# Patient Record
Sex: Female | Born: 1950 | Race: White | Hispanic: No | Marital: Married | Smoking: Never smoker
Health system: Southern US, Community
[De-identification: ages and names within clinical notes are randomized; demographics above are authoritative.]

## PROBLEM LIST (undated history)

## (undated) DIAGNOSIS — T8859XA Other complications of anesthesia, initial encounter: Secondary | ICD-10-CM

## (undated) DIAGNOSIS — D649 Anemia, unspecified: Secondary | ICD-10-CM

## (undated) DIAGNOSIS — K317 Polyp of stomach and duodenum: Secondary | ICD-10-CM

## (undated) DIAGNOSIS — E119 Type 2 diabetes mellitus without complications: Secondary | ICD-10-CM

## (undated) DIAGNOSIS — K219 Gastro-esophageal reflux disease without esophagitis: Secondary | ICD-10-CM

## (undated) DIAGNOSIS — E785 Hyperlipidemia, unspecified: Secondary | ICD-10-CM

## (undated) DIAGNOSIS — F0390 Unspecified dementia without behavioral disturbance: Secondary | ICD-10-CM

## (undated) DIAGNOSIS — Z9289 Personal history of other medical treatment: Secondary | ICD-10-CM

## (undated) DIAGNOSIS — C8590 Non-Hodgkin lymphoma, unspecified, unspecified site: Secondary | ICD-10-CM

## (undated) DIAGNOSIS — T4145XA Adverse effect of unspecified anesthetic, initial encounter: Secondary | ICD-10-CM

## (undated) DIAGNOSIS — F329 Major depressive disorder, single episode, unspecified: Secondary | ICD-10-CM

## (undated) DIAGNOSIS — Z9481 Bone marrow transplant status: Secondary | ICD-10-CM

## (undated) DIAGNOSIS — C801 Malignant (primary) neoplasm, unspecified: Secondary | ICD-10-CM

## (undated) DIAGNOSIS — F32A Depression, unspecified: Secondary | ICD-10-CM

## (undated) HISTORY — DX: Hypomagnesemia: E83.42

## (undated) HISTORY — PX: PORTACATH PLACEMENT: SHX2246

## (undated) HISTORY — DX: Hyperlipidemia, unspecified: E78.5

## (undated) HISTORY — PX: BONE MARROW TRANSPLANT: SHX200

## (undated) HISTORY — DX: Bone marrow transplant status: Z94.81

## (undated) HISTORY — PX: LYMPH NODE DISSECTION: SHX5087

## (undated) HISTORY — DX: Anemia, unspecified: D64.9

## (undated) HISTORY — PX: REDUCTION MAMMAPLASTY: SUR839

---

## 2011-02-08 HISTORY — PX: UPPER GASTROINTESTINAL ENDOSCOPY: SHX188

## 2011-08-19 ENCOUNTER — Other Ambulatory Visit: Payer: Self-pay | Admitting: *Deleted

## 2011-08-19 ENCOUNTER — Ambulatory Visit: Payer: BC Managed Care – PPO

## 2011-08-19 ENCOUNTER — Ambulatory Visit
Admission: RE | Admit: 2011-08-19 | Discharge: 2011-08-19 | Disposition: A | Discharge status: Home / Self Care | Payer: BC Managed Care – PPO | Source: Ambulatory Visit | Attending: *Deleted | Admitting: *Deleted

## 2011-08-19 DIAGNOSIS — M25559 Pain in unspecified hip: Secondary | ICD-10-CM

## 2011-08-19 DIAGNOSIS — M7989 Other specified soft tissue disorders: Secondary | ICD-10-CM

## 2011-08-19 DIAGNOSIS — M79609 Pain in unspecified limb: Secondary | ICD-10-CM

## 2011-08-21 ENCOUNTER — Other Ambulatory Visit: Payer: Self-pay

## 2011-08-21 ENCOUNTER — Emergency Department (HOSPITAL_COMMUNITY): Payer: BC Managed Care – PPO

## 2011-08-21 ENCOUNTER — Inpatient Hospital Stay (HOSPITAL_COMMUNITY)
Admission: EM | Admit: 2011-08-21 | Discharge: 2011-08-22 | DRG: 404 | Disposition: A | Discharge status: Home / Self Care | Payer: BC Managed Care – PPO | Attending: Internal Medicine | Admitting: Internal Medicine

## 2011-08-21 ENCOUNTER — Encounter: Payer: Self-pay | Admitting: Emergency Medicine

## 2011-08-21 DIAGNOSIS — E119 Type 2 diabetes mellitus without complications: Secondary | ICD-10-CM | POA: Diagnosis present

## 2011-08-21 DIAGNOSIS — I10 Essential (primary) hypertension: Secondary | ICD-10-CM | POA: Diagnosis present

## 2011-08-21 DIAGNOSIS — R1013 Epigastric pain: Secondary | ICD-10-CM | POA: Diagnosis present

## 2011-08-21 DIAGNOSIS — D131 Benign neoplasm of stomach: Secondary | ICD-10-CM | POA: Diagnosis present

## 2011-08-21 DIAGNOSIS — C8590 Non-Hodgkin lymphoma, unspecified, unspecified site: Secondary | ICD-10-CM

## 2011-08-21 DIAGNOSIS — R109 Unspecified abdominal pain: Secondary | ICD-10-CM

## 2011-08-21 DIAGNOSIS — K219 Gastro-esophageal reflux disease without esophagitis: Secondary | ICD-10-CM | POA: Diagnosis present

## 2011-08-21 DIAGNOSIS — C8589 Other specified types of non-Hodgkin lymphoma, extranodal and solid organ sites: Principal | ICD-10-CM | POA: Diagnosis present

## 2011-08-21 HISTORY — DX: Malignant (primary) neoplasm, unspecified: C80.1

## 2011-08-21 HISTORY — DX: Type 2 diabetes mellitus without complications: E11.9

## 2011-08-21 HISTORY — DX: Polyp of stomach and duodenum: K31.7

## 2011-08-21 HISTORY — DX: Gastro-esophageal reflux disease without esophagitis: K21.9

## 2011-08-21 LAB — CARDIAC PANEL(CRET KIN+CKTOT+MB+TROPI)
CK, MB: 2.3 ng/mL (ref 0.3–4.0)
CK, MB: 2.4 ng/mL (ref 0.3–4.0)
Relative Index: INVALID (ref 0.0–2.5)
Total CK: 33 U/L (ref 7–177)
Total CK: 31 U/L (ref 7–177)
Total CK: 25 U/L (ref 7–177)
Troponin I: <0.3 ng/mL (ref <0.30)

## 2011-08-21 LAB — DIFFERENTIAL
Eosinophils Relative: 1 % (ref 0–5)
Lymphocytes Relative: 16 % (ref 12–46)
Monocytes Absolute: 0.3 10*3/uL (ref 0.1–1.0)
Monocytes Relative: 6 % (ref 3–12)
Neutro Abs: 4.4 10*3/uL (ref 1.7–7.7)

## 2011-08-21 LAB — COMPREHENSIVE METABOLIC PANEL
BUN: 9 mg/dL (ref 6–23)
CO2: 27 mEq/L (ref 19–32)
Calcium: 9.8 mg/dL (ref 8.4–10.5)
Chloride: 100 mEq/L (ref 96–112)
Creatinine, Ser: 0.72 mg/dL (ref 0.50–1.10)
GFR calc Af Amer: >90 mL/min (ref >90)
GFR calc non Af Amer: >90 mL/min (ref >90)
Glucose, Bld: 151 mg/dL — ABNORMAL HIGH (ref 70–99)
Total Bilirubin: 0.8 mg/dL (ref 0.3–1.2)

## 2011-08-21 LAB — URINALYSIS, ROUTINE W REFLEX MICROSCOPIC
Glucose, UA: NEGATIVE mg/dL
Ketones, ur: 15 mg/dL — AB
Leukocytes, UA: NEGATIVE
Nitrite: NEGATIVE
Protein, ur: NEGATIVE mg/dL
Urobilinogen, UA: 0.2 mg/dL (ref 0.0–1.0)

## 2011-08-21 LAB — CBC
HCT: 35.3 % — ABNORMAL LOW (ref 36.0–46.0)
Hemoglobin: 12.3 g/dL (ref 12.0–15.0)
MCV: 85.5 fL (ref 78.0–100.0)
WBC: 5.7 10*3/uL (ref 4.0–10.5)

## 2011-08-21 LAB — LIPASE, BLOOD: Lipase: 35 U/L (ref 11–59)

## 2011-08-21 MED ORDER — ACETAMINOPHEN 325 MG PO TABS
650.0000 mg | ORAL_TABLET | Freq: Four times a day (QID) | ORAL | Status: DC | PRN
  Administered 2011-08-22 (×2): 650 mg via ORAL
  Filled 2011-08-21 (×2): qty 2

## 2011-08-21 MED ORDER — HYDROMORPHONE HCL PF 1 MG/ML IJ SOLN
1.0000 mg | Freq: Once | INTRAMUSCULAR | Status: AC
  Administered 2011-08-21: 1 mg via INTRAVENOUS
  Filled 2011-08-21: qty 1

## 2011-08-21 MED ORDER — ACETAMINOPHEN 650 MG RE SUPP: 650.0000 mg | Freq: Four times a day (QID) | RECTAL | Status: DC | PRN

## 2011-08-21 MED ORDER — ONDANSETRON HCL 4 MG/2ML IJ SOLN
4.0000 mg | Freq: Once | INTRAMUSCULAR | Status: AC
  Administered 2011-08-21: 4 mg via INTRAVENOUS
  Filled 2011-08-21: qty 2

## 2011-08-21 MED ORDER — SODIUM CHLORIDE 0.9 % IV BOLUS (SEPSIS)
500.0000 mL | Freq: Once | INTRAVENOUS | Status: AC
  Administered 2011-08-21: 500 mL via INTRAVENOUS

## 2011-08-21 MED ORDER — OXYCODONE HCL 5 MG PO TABS: 5.0000 mg | ORAL_TABLET | ORAL | Status: DC | PRN

## 2011-08-21 MED ORDER — MORPHINE SULFATE 2 MG/ML IJ SOLN: 1.0000 mg | INTRAMUSCULAR | Status: DC | PRN

## 2011-08-21 MED ORDER — ENOXAPARIN SODIUM 30 MG/0.3ML ~~LOC~~ SOLN
30.0000 mg | SUBCUTANEOUS | Status: DC
  Administered 2011-08-21: 30 mg via SUBCUTANEOUS
  Filled 2011-08-21 (×2): qty 0.3

## 2011-08-21 MED ORDER — SODIUM CHLORIDE 0.9 % IV SOLN
INTRAVENOUS | Status: DC
  Administered 2011-08-21 – 2011-08-22 (×2): via INTRAVENOUS

## 2011-08-21 MED ORDER — MORPHINE SULFATE 2 MG/ML IJ SOLN
2.0000 mg | INTRAMUSCULAR | Status: DC | PRN
  Administered 2011-08-21: 2 mg via INTRAVENOUS
  Filled 2011-08-21: qty 1

## 2011-08-21 MED ORDER — RAMIPRIL 10 MG PO CAPS
20.0000 mg | ORAL_CAPSULE | Freq: Every day | ORAL | Status: DC
  Administered 2011-08-21: 20 mg via ORAL
  Filled 2011-08-21 (×4): qty 2

## 2011-08-21 MED ORDER — INSULIN ASPART 100 UNIT/ML ~~LOC~~ SOLN
0.0000 [IU] | Freq: Three times a day (TID) | SUBCUTANEOUS | Status: DC
  Filled 2011-08-21: qty 3

## 2011-08-21 MED ORDER — ONDANSETRON HCL 4 MG/2ML IJ SOLN
INTRAMUSCULAR | Status: AC
  Administered 2011-08-21: 4 mg via INTRAVENOUS
  Filled 2011-08-21: qty 2

## 2011-08-21 MED ORDER — ONDANSETRON HCL 4 MG/2ML IJ SOLN
4.0000 mg | Freq: Once | INTRAMUSCULAR | Status: AC
  Administered 2011-08-21: 4 mg via INTRAVENOUS

## 2011-08-21 MED ORDER — SODIUM CHLORIDE 0.9 % IV SOLN
INTRAVENOUS | Status: DC
  Administered 2011-08-21: 08:00:00 via INTRAVENOUS

## 2011-08-21 MED ORDER — PANTOPRAZOLE SODIUM 40 MG IV SOLR
40.0000 mg | Freq: Two times a day (BID) | INTRAVENOUS | Status: DC
  Administered 2011-08-21 – 2011-08-22 (×2): 40 mg via INTRAVENOUS
  Filled 2011-08-21 (×7): qty 40

## 2011-08-21 MED ORDER — IOHEXOL 300 MG/ML  SOLN
100.0000 mL | Freq: Once | INTRAMUSCULAR | Status: AC | PRN
  Administered 2011-08-21: 100 mL via INTRAVENOUS

## 2011-08-21 MED ORDER — VENLAFAXINE HCL ER 75 MG PO CP24
75.0000 mg | ORAL_CAPSULE | Freq: Every day | ORAL | Status: DC
  Administered 2011-08-21: 75 mg via ORAL
  Filled 2011-08-21 (×4): qty 1

## 2011-08-21 MED ORDER — ALPRAZOLAM 0.25 MG PO TABS: 0.2500 mg | ORAL_TABLET | Freq: Two times a day (BID) | ORAL | Status: DC | PRN

## 2011-08-21 NOTE — ED Notes (Signed)
Vital signs stable. 

## 2011-08-21 NOTE — Consult Note (Signed)
Chart was reviewed and patient was examined. X-rays were reviewed.    I agree with management and plans.  I suspect pain is related to worsening lymphoma/ adenopathy.  Active PUD is less likely althoughnought to be r/o.  Barbette Hair. Arlyce Dice, M.D., [facility]

## 2011-08-21 NOTE — ED Notes (Signed)
MD at bedside. 

## 2011-08-21 NOTE — ED Notes (Signed)
ZOX:[id number]<AV> Expected date:08/21/11<BR> Expected time: 6:53 AM<BR> Means of arrival:Ambulance<BR> Comments:<BR> Abdominal pain

## 2011-08-21 NOTE — ED Notes (Signed)
Family at bedside. 

## 2011-08-21 NOTE — H&P (Signed)
Patient's PCP: No primary provider on file., patient reports that she is from [place] visiting her daughter.  Chief Complaint: Epigastric pain  History of Present Illness: Victoria Wood is a 60 y.o. Caucasian female with history of non-Hodgkin's lymphoma who has recently received Rituxan, GERD, hypertension, Type 2 DM, who presents with the above complaints.  She reports that she just received Dexon in November of 2012 for non-Hodgkin's lymphoma.  She also reports that about 3 or 4 months ago she was admitted to our hospital in [place] and has had extensive cardiac workup.  She reports that she has had a stress test as well as a 2-D echocardiogram which I do not have the results available for review.  She also reports that she has had a GI workup done but could not tell me exactly what workup she has had.  She does admit to having GERD and takes Nexium intermittently.  This morning around 3-4 o'clock she reported sharp burning-like pain in the epigastric region with radiating pain to her signs in a bandlike manner.  Because her pain was getting worse she presented to the emergency department for further evaluation.  CT scan was obtained with results as indicated above.  The hospitalist service was asked to admit the patient for further care and management.  Patient denies any recent fevers, chills, nausea or vomiting. Denies any shortness of breath.  Meds: Scheduled Meds:   .  HYDROmorphone (DILAUDID) injection  1 mg Intravenous Once  . ondansetron  4 mg Intravenous Once  . sodium chloride  500 mL Intravenous Once   Continuous Infusions:   . sodium chloride 125 mL/hr at 08/21/11 0751   PRN Meds:.iohexol Allergies: Penicillins and Shellfish allergy Past Medical History  Diagnosis Date  . Gastric polyps     History of  . Cancer     hx of Rituxan in 07/2011  . Non-Hodgkin's lymphoma   . GERD (gastroesophageal reflux disease)   . HTN (hypertension)   . DM type 2 (diabetes mellitus, type 2)      Past Surgical History  Procedure Date  . Portacath placement   . Upper gastrointestinal endoscopy 02/2011    gastric polyps x 2  . Neck and thigh surgery     lymph nodes removed   Family History  Problem Relation Age of Onset  . Hypertension Mother   . Hypertension Father   . Kidney disease Father    History   Social History  . Marital Status: Married    Spouse Name: N/A    Number of Children: N/A  . Years of Education: N/A   Occupational History  . Not on file.   Social History Main Topics  . Smoking status: Never Smoker   . Smokeless tobacco: Never Used  . Alcohol Use: 2.4 oz/week    4 Glasses of wine per week  . Drug Use:   . Sexually Active: No   Other Topics Concern  . Not on file   Social History Narrative  . No narrative on file   Review of Systems: All systems reviewed with the patient and positive as per history of present illness, otherwise all other systems are negative.  Physical Exam: Blood pressure 132/59, pulse 68, temperature 98.4 F (36.9 C), temperature source Oral, resp. rate 18, SpO2 100.00%. General: Awake, Oriented x3, No acute distress. HEENT: EOMI, Moist mucous membranes Neck: Supple CV: S1 and S2 Lungs: Clear to ascultation bilaterally Abdomen: Soft, minimal tenderness to palpation, reports pain is constant not  unchanged by palpation, Nondistended, +bowel sounds. Ext: Good pulses. Trace edema. No clubbing or cyanosis noted. Neuro: Cranial Nerves II-XII grossly intact. Has 5/5 motor strength in upper and lower extremities.  Lab results:  [facility] 08/21/11 0744  NA 137  K 3.5  CL 100  CO2 27  GLUCOSE 151*  BUN 9  CREATININE 0.72  CALCIUM 9.8  MG --  PHOS --    Basename 08/21/11 0744  AST 17  ALT 9  ALKPHOS 73  BILITOT 0.8  PROT 6.4  ALBUMIN 3.9    Basename 08/21/11 0744  LIPASE 35  AMYLASE --    Basename 08/21/11 0744  WBC 5.7  NEUTROABS 4.4  HGB 12.3  HCT 35.3*  MCV 85.5  PLT 155    Basename  08/21/11 0744  CKTOTAL 33  CKMB 2.3  CKMBINDEX --  TROPONINI <0.30   No components found with this basename: POCBNP:3 No results found for this basename: DDIMER in the last 72 hours No results found for this basename: HGBA1C:2 in the last 72 hours No results found for this basename: CHOL:2,HDL:2,LDLCALC:2,TRIG:2,CHOLHDL:2,LDLDIRECT:2 in the last 72 hours No results found for this basename: TSH,T4TOTAL,FREET3,T3FREE,THYROIDAB in the last 72 hours No results found for this basename: VITAMINB12:2,FOLATE:2,FERRITIN:2,TIBC:2,IRON:2,RETICCTPCT:2 in the last 72 hours Imaging results:  Ct Abdomen Pelvis W Contrast  08/21/2011  *RADIOLOGY REPORT*  Clinical Data: Epigastric pain, nausea.  History of gastric lymphoma with prior chemotherapy.  CT ABDOMEN AND PELVIS WITH CONTRAST  Technique:  Multidetector CT imaging of the abdomen and pelvis was performed following the standard protocol during bolus administration of intravenous contrast.  Contrast: OMNIPAQUE IOHEXOL 300 MG/ML IV SOLN none.  Comparison: None.  Findings: Bibasilar atelectasis.  Heart is upper limits normal in size.  No effusions.  There are mildly enlarged right cardiophrenic lymph nodes.  Lymph node on image 7 measures 13 mm.  Lymph node on image 19 anterior to the liver measures 12 mm.  On the same image, a lymph node anterior to the right heart measures 9 mm.  These are short axis measurements.  Borderline and mildly enlarged celiac axis and porta hepatis lymph nodes.  Portacaval lymph node on image 25 has a short axis diameter of 19 mm.  There is extensive retroperitoneal adenopathy.  Left periaortic lymph node on image 38 has a short axis diameter of 23 mm.  Aortocaval lymph node on the same image has a short axis diameter of 20 mm.  Adenopathy continues into the iliac chains bilaterally, right much greater than left.  Enlarged lymph node at the common iliac bifurcation on the right has a short axis diameter of 26 mm.  External iliac  node on image 73 on the right has a short axis diameter of 24 mm.  Left external iliac node on image 73 has a short axis diameter of 12 mm.  Bilateral inguinal adenopathy also noted, right greater than left.  Small amount of free fluid in the pelvis.  Uterus, adnexa urinary bladder are grossly unremarkable.  Liver, spleen, pancreas, adrenals and kidneys are normal.  Stomach is grossly unremarkable. There is mild haziness throughout the mesentery with borderline and mildly enlarged mesenteric lymph nodes.  Large and small bowel are grossly unremarkable.  Aorta is calcified, non-aneurysmal.  IMPRESSION: Extensive of abdominal adenopathy including retroperitoneal, mesenteric, porta hepatis and celiac axis.  This continues in the pelvis within the iliac chains and pelvic sidewall, right greater than left.  Also, inguinal adenopathy, right greater than left. There is a right cardiophrenic  angle adenopathy as well. Findings findings most compatible with lymphoma.  Small amount of free fluid in the pelvis.  Dependent atelectasis in the lung bases.  Original Report Authenticated By: Cyndie Chime, M.D.   US Venous Img Lower Bilateral  08/19/2011  *RADIOLOGY REPORT*  Clinical Data: Right lower extremity edema.  VENOUS DUPLEX ULTRASOUND OF BILATERAL LOWER EXTREMITIES  Technique:  Gray-scale sonography with graded compression, as well as color Doppler and duplex ultrasound, were performed to evaluate the deep venous system of both lower extremities from the level of the common femoral vein through the popliteal and proximal calf veins.  Spectral Doppler was utilized to evaluate flow at rest and with distal augmentation maneuvers.  Comparison:  None.  Findings: From the level of the common femoral vein to the popliteal vein, there is adequate color flow, compression and augmentation without evidence of a lower extremity deep venous thrombosis on either side.  Superficial veins are compressible as are visualized calf veins.   Reflux is noted bilaterally.  Bilateral inguinal adenopathy.  IMPRESSION: No evidence of lower extremity deep venous thrombosis on either side.  Bilateral inguinal adenopathy.  Etiology/significance indeterminate.  Original Report Authenticated By: Fuller Canada, M.D.   Other results: EKG: normal EKG, normal sinus rhythm, there are no previous tracings available for comparison.  Assessment & Plan by Problem: 1. Epigastric pain - Etiology unclear at this time.  Lipase and liver function tests normal.  CT scan shows the above findings.  Spoke with gastroenterology who will evaluate the patient for further workup.  Will also try to obtain results from recent GI workup done in [place].  Patient reports that she has had extensive cardiac workup including a stress test a few months ago, results not available for review, however will admit the patient to telemetry and trend troponins to rule the patient out for acute coronary syndrome.  We'll have the patient on twice a day IV PPI.  2.  Non-Hodgkin's lymphoma - CT scan shows the above findings.  I do not have prior imaging for comparison.  Old try to obtain records from her oncologist.  Continue management as per outpatient.  Per radiology the largest lymph node is 2.4 cm.  3. GERD (gastroesophageal reflux disease) - Continue twice a day IV PPI.  4. HTN (hypertension) - stable at this time continue home medications.  5. DM type 2 (diabetes mellitus, type 2) - hold metformin as the patient received contrast for the CT.  Will have the patient on sensitive sliding scale insulin.  6. Prophylaxis.  Lovenox for DVT prophylaxis and PPI for GI prophylaxis.  7.  CODE STATUS, full code this was discussed with patient and daughter at the time of admission.  Time spent on admission, talking to the patient, and coordinating care was: 60 mins.  Ta Fair A, MD 08/21/2011, 2:23 PM

## 2011-08-21 NOTE — Consult Note (Signed)
Referring Provider: [facility]-dr ReddyPrimary Care Physician:  No primary provider on file. Primary Gastroenterologist:  None locally, has Md in [place]  Reason for Consultation:  Epigastric pain acute  HPI: Victoria Wood is a 60 y.o. female with hx of non Hodgkins lymphoma initially dx at age 17. She had recurrence in 2008 and underwent chemo with good response. She had recurrence again found this fall. She has stage 4 disease. She has just completed a regimen of 4 courses of Rituxan, given weekly over the past month. She says she had re-imaging and knows the Rituxan did not work. Her plan was to come here for the holidays, then decide on further treatment when she got back home.  She says her appetite has been poor, and she has been losing weight. No N/v. No fever/chills, no diarrhea, melena  etc . She has had some abdominal pain all along , but says it has not been bad. She has no tbeen requiring any pain meds, or Asa, Nsaids.  This am she  Developed much worse band like pain across her upper abdomen, burning in nature, no radiation, and came to the  ER.   Labs here unremarkable, Ct as outlined below,.   Past Medical History  Diagnosis Date  . Gastric polyps     History of  . Cancer     hx of Rituxan in 07/2011  . Non-Hodgkin's lymphoma   . GERD (gastroesophageal reflux disease)   . HTN (hypertension)   . DM type 2 (diabetes mellitus, type 2)     Past Surgical History  Procedure Date  . Portacath placement   . Upper gastrointestinal endoscopy 02/2011    gastric polyps x 2  . Neck and thigh surgery     lymph nodes removed    Prior to Admission medications   Medication Sig Start Date End Date Taking? Authorizing Provider  ALPRAZolam (XANAX) 0.25 MG tablet Take 0.25 mg by mouth 2 (two) times daily as needed.     Yes Historical Provider, MD  Cyanocobalamin (VITAMIN B 12 PO) Take 1 tablet by mouth daily.     Yes Historical Provider, MD  metFORMIN (GLUCOPHAGE) 500 MG tablet  Take 500 mg by mouth 2 (two) times daily with a meal.     Yes Historical Provider, MD  OVER THE COUNTER MEDICATION Take 3 tablets by mouth daily. Procera vitamin    Yes Historical Provider, MD  ramipril (ALTACE) 10 MG tablet Take 20 mg by mouth daily.     Yes Historical Provider, MD  venlafaxine (EFFEXOR-XR) 75 MG 24 hr capsule Take 75 mg by mouth daily.     Yes Historical Provider, MD    Current Facility-Administered Medications  Medication Dose Route Frequency Provider Last Rate Last Dose  . 0.9 %  sodium chloride infusion   Intravenous Continuous Carleene Cooper III, MD 125 mL/hr at 08/21/11 0751    . HYDROmorphone (DILAUDID) injection 1 mg  1 mg Intravenous Once Carleene Cooper III, MD   1 mg at 08/21/11 0746  . iohexol (OMNIPAQUE) 300 MG/ML solution 100 mL  100 mL Intravenous Once PRN Medication Radiologist   100 mL at 08/21/11 0956  . ondansetron (ZOFRAN) injection 4 mg  4 mg Intravenous Once Carleene Cooper III, MD   4 mg at 08/21/11 0745  . pantoprazole (PROTONIX) injection 40 mg  40 mg Intravenous Q12H Srikar A Reddy      . sodium chloride 0.9 % bolus 500 mL  500 mL Intravenous Once Carleene Cooper  III, MD   500 mL at 08/21/11 5409   Current Outpatient Prescriptions  Medication Sig Dispense Refill  . ALPRAZolam (XANAX) 0.25 MG tablet Take 0.25 mg by mouth 2 (two) times daily as needed.        . Cyanocobalamin (VITAMIN B 12 PO) Take 1 tablet by mouth daily.        . metFORMIN (GLUCOPHAGE) 500 MG tablet Take 500 mg by mouth 2 (two) times daily with a meal.        . OVER THE COUNTER MEDICATION Take 3 tablets by mouth daily. Procera vitamin       . ramipril (ALTACE) 10 MG tablet Take 20 mg by mouth daily.        Marland Kitchen venlafaxine (EFFEXOR-XR) 75 MG 24 hr capsule Take 75 mg by mouth daily.          Allergies as of 08/21/2011 - Review Complete 08/21/2011  Allergen Reaction Noted  . Penicillins  08/21/2011  . Shellfish allergy  08/21/2011    Family History  Problem Relation Age of Onset  .  Hypertension Mother   . Hypertension Father   . Kidney disease Father     History   Social History  . Marital Status: Married    Spouse Name: N/A    Number of Children: N/A  . Years of Education: N/A   Occupational History  . Not on file.   Social History Main Topics  . Smoking status: Never Smoker   . Smokeless tobacco: Never Used  . Alcohol Use: 2.4 oz/week    4 Glasses of wine per week  . Drug Use:   . Sexually Active: No   Other Topics Concern  . Not on file   Social History Narrative  . No narrative on file    Review of Systems: Pertinent positive and negative review of systems were noted in the above HPI section.  All other review of systems was otherwise negative.  Physical Exam: Vital signs in last 24 hours: Temp:  [97.5 F (36.4 C)-98.4 F (36.9 C)] 98.4 F (36.9 C) (12/12 1223) Pulse Rate:  [66-79] 68  (12/12 1402) Resp:  [15-24] 18  (12/12 1402) BP: (132-169)/(59-78) 132/59 mmHg (12/12 1402) SpO2:  [100 %] 100 % (12/12 1402) Last BM Date:  (- stated sometime on 08/20/2011) General:   Alert,  Well-developed, well-nourished, pleasant and cooperative in NAD,uncomfortable appearing Head:  Normocephalic and atraumatic. Eyes:  Sclera clear, no icterus.   Conjunctiva pink. Ears:  Normal auditory acuity. Nose:  No deformity, discharge,  or lesions. Mouth:  No deformity or lesions.    Lungs:  Clear throughout to auscultation.   No wheezes, crackles, or rhonchi. Heart:  Regular rate and rhythm; no murmurs, clicks, rubs,  or gallops. Abdomen: soft, tender rather diffusely, more so in upper abdomen, Bs+ no palp mass or HSM Rectal; not done Msk:  Symmetrical without gross deformities. . Pulses:  Normal pulses noted. Extremities:  Without clubbing or edema. Neurologic:  Alert and  oriented x4;  grossly normal neurologically. Skin:  Intact without significant lesions or rashes.. Psych:  Alert and cooperative. Normal mood and affect.  Intake/Output from  previous day:   Intake/Output this shift:    Lab Results:  [facility] 08/21/11 0744  WBC 5.7  HGB 12.3  HCT 35.3*  PLT 155   BMET  Basename 08/21/11 0744  NA 137  K 3.5  CL 100  CO2 27  GLUCOSE 151*  BUN 9  CREATININE 0.72  CALCIUM  9.8   LFT  Basename 08/21/11 0744  PROT 6.4  ALBUMIN 3.9  AST 17  ALT 9  ALKPHOS 73  BILITOT 0.8  BILIDIR --  IBILI --   PT/INR No results found for this basename: LABPROT:2,INR:2 in the last 72 hours Hepatitis Panel No results found for this basename: HEPBSAG,HCVAB,HEPAIGM,HEPBIGM in the last 72 hours    Studies/Results: Ct Abdomen Pelvis W Contrast  08/21/2011  *RADIOLOGY REPORT*  Clinical Data: Epigastric pain, nausea.  History of gastric lymphoma with prior chemotherapy.  CT ABDOMEN AND PELVIS WITH CONTRAST  Technique:  Multidetector CT imaging of the abdomen and pelvis was performed following the standard protocol during bolus administration of intravenous contrast.  Contrast: OMNIPAQUE IOHEXOL 300 MG/ML IV SOLN none.  Comparison: None.  Findings: Bibasilar atelectasis.  Heart is upper limits normal in size.  No effusions.  There are mildly enlarged right cardiophrenic lymph nodes.  Lymph node on image 7 measures 13 mm.  Lymph node on image 19 anterior to the liver measures 12 mm.  On the same image, a lymph node anterior to the right heart measures 9 mm.  These are short axis measurements.  Borderline and mildly enlarged celiac axis and porta hepatis lymph nodes.  Portacaval lymph node on image 25 has a short axis diameter of 19 mm.  There is extensive retroperitoneal adenopathy.  Left periaortic lymph node on image 38 has a short axis diameter of 23 mm.  Aortocaval lymph node on the same image has a short axis diameter of 20 mm.  Adenopathy continues into the iliac chains bilaterally, right much greater than left.  Enlarged lymph node at the common iliac bifurcation on the right has a short axis diameter of 26 mm.  External iliac  node on image 73 on the right has a short axis diameter of 24 mm.  Left external iliac node on image 73 has a short axis diameter of 12 mm.  Bilateral inguinal adenopathy also noted, right greater than left.  Small amount of free fluid in the pelvis.  Uterus, adnexa urinary bladder are grossly unremarkable.  Liver, spleen, pancreas, adrenals and kidneys are normal.  Stomach is grossly unremarkable. There is mild haziness throughout the mesentery with borderline and mildly enlarged mesenteric lymph nodes.  Large and small bowel are grossly unremarkable.  Aorta is calcified, non-aneurysmal.  IMPRESSION: Extensive of abdominal adenopathy including retroperitoneal, mesenteric, porta hepatis and celiac axis.  This continues in the pelvis within the iliac chains and pelvic sidewall, right greater than left.  Also, inguinal adenopathy, right greater than left. There is a right cardiophrenic angle adenopathy as well. Findings findings most compatible with lymphoma.  Small amount of free fluid in the pelvis.  Dependent atelectasis in the lung bases.  Original Report Authenticated By: Cyndie Chime, M.D.   US Venous Img Lower Bilateral  08/19/2011  *RADIOLOGY REPORT*  Clinical Data: Right lower extremity edema.  VENOUS DUPLEX ULTRASOUND OF BILATERAL LOWER EXTREMITIES  Technique:  Gray-scale sonography with graded compression, as well as color Doppler and duplex ultrasound, were performed to evaluate the deep venous system of both lower extremities from the level of the common femoral vein through the popliteal and proximal calf veins.  Spectral Doppler was utilized to evaluate flow at rest and with distal augmentation maneuvers.  Comparison:  None.  Findings: From the level of the common femoral vein to the popliteal vein, there is adequate color flow, compression and augmentation without evidence of a lower extremity deep venous  thrombosis on either side.  Superficial veins are compressible as are visualized calf veins.   Reflux is noted bilaterally.  Bilateral inguinal adenopathy.  IMPRESSION: No evidence of lower extremity deep venous thrombosis on either side.  Bilateral inguinal adenopathy.  Etiology/significance indeterminate.  Original Report Authenticated By: Fuller Canada, M.D.    Impression: #1 60 yo female with known Stage 4  Non Hodgkins lymphoma, just completed four rounds of rituxan with no response. Now presenting with acute  Upper abdominal pain  Onset today.  R/o pain secondary to lymphoma, r/o gastropathy, PUD etc.  Plan:  Will schedule for EGD in am Clear liquids tonight Change pain med to morphine as dilaudid is causing severe headache  PPi twice daily  pt is not opposed to Oncology consult here, and treatment if needed. She and her husband have been planning on relocating here.   Violetta Lavalle  08/21/2011, 4:02 PM

## 2011-08-21 NOTE — ED Provider Notes (Addendum)
History     CSN: [id number] Arrival date & time: 08/21/2011  7:11 AM   First MD Initiated Contact with Patient 08/21/11 [phone]      Chief Complaint  Patient presents with  . Abdominal Pain    (Consider location/radiation/quality/duration/timing/severity/associated sxs/prior treatment) Patient is a 60 y.o. female presenting with abdominal pain. The history is provided by the patient and a relative.  Abdominal Pain The primary symptoms of the illness include abdominal pain and nausea. The primary symptoms of the illness do not include vomiting. The current episode started 3 to 5 hours ago. The onset of the illness was sudden. The problem has been gradually worsening.  The abdominal pain is located in the epigastric region. The abdominal pain radiates to the back. The severity of the abdominal pain is 10/10. The abdominal pain is relieved by nothing. The abdominal pain is exacerbated by vomiting.  Risk factors: She has a history of gastric lymphoma, and finished a course of 5 Rituxan (sp?) treatments in November. Additional symptoms associated with the illness include back pain. Symptoms associated with the illness do not include constipation.    Past Medical History  Diagnosis Date  . Cancer     Past Surgical History  Procedure Date  . Portacath placement     No family history on file.  History  Substance Use Topics  . Smoking status: Not on file  . Smokeless tobacco: Not on file  . Alcohol Use: No    OB History    Grav Para Term Preterm Abortions TAB SAB Ect Mult Living                  Review of Systems  Constitutional: Negative.   HENT: Negative.   Eyes: Negative.   Respiratory: Negative.   Cardiovascular: Negative.   Gastrointestinal: Positive for nausea and abdominal pain. Negative for vomiting, constipation and blood in stool.  Genitourinary: Negative.   Musculoskeletal: Positive for back pain.       Pt had swelling of the left leg 2 days ago, was seen at  [facility], had negative venous dopplers.    Neurological: Negative.   Psychiatric/Behavioral: Negative.     Allergies  Review of patient's allergies indicates not on file.  Home Medications  No current outpatient prescriptions on file.  BP 169/78  Pulse 71  Temp(Src) 97.5 F (36.4 C) (Oral)  Resp 24  SpO2 100%  Physical Exam  Vitals reviewed. Constitutional: She is oriented to person, place, and time. She appears well-developed and well-nourished.       In moderate to severe distress with epigastric pain.    HENT:       Mouth dry.   Eyes: Conjunctivae and EOM are normal. Pupils are equal, round, and reactive to light. No scleral icterus.  Neck: Normal range of motion. Neck supple.  Cardiovascular: Normal rate, regular rhythm and normal heart sounds.   Pulmonary/Chest: Effort normal and breath sounds normal. No respiratory distress. She exhibits no tenderness.  Abdominal: Soft.       She has epigastric tenderness, no mass, rebound or rigidity.  Musculoskeletal: Normal range of motion.  Lymphadenopathy:    She has no cervical adenopathy.  Neurological: She is alert and oriented to person, place, and time.       No sensory or motor deficit.   Skin: Skin is warm and dry.  Psychiatric: She has a normal mood and affect. Her behavior is normal.    ED Course  Procedures (including critical care  time)   Labs Reviewed  CBC  DIFFERENTIAL  COMPREHENSIVE METABOLIC PANEL  LIPASE, BLOOD  URINALYSIS, ROUTINE W REFLEX MICROSCOPIC  URINE CULTURE  CARDIAC PANEL(CRET KIN+CKTOT+MB+TROPI)   US Venous Img Lower Bilateral  08/19/2011  *RADIOLOGY REPORT*  Clinical Data: Right lower extremity edema.  VENOUS DUPLEX ULTRASOUND OF BILATERAL LOWER EXTREMITIES  Technique:  Gray-scale sonography with graded compression, as well as color Doppler and duplex ultrasound, were performed to evaluate the deep venous system of both lower extremities from the level of the common femoral vein  through the popliteal and proximal calf veins.  Spectral Doppler was utilized to evaluate flow at rest and with distal augmentation maneuvers.  Comparison:  None.  Findings: From the level of the common femoral vein to the popliteal vein, there is adequate color flow, compression and augmentation without evidence of a lower extremity deep venous thrombosis on either side.  Superficial veins are compressible as are visualized calf veins.  Reflux is noted bilaterally.  Bilateral inguinal adenopathy.  IMPRESSION: No evidence of lower extremity deep venous thrombosis on either side.  Bilateral inguinal adenopathy.  Etiology/significance indeterminate.  Original Report Authenticated By: Fuller Canada, M.D.   7:46 AM Pt seen --> physical exam performed.  IV fluids ordered.  IV Dilaudid and Zofran ordered.  Lab work and CT of abdomen/pelvis with oral and IV contrast ordered.  8:07 AM  Date: 08/21/2011  Rate: 63  Rhythm: normal sinus rhythm  QRS Axis: normal  Intervals: normal  ST/T Wave abnormalities: normal  Conduction Disutrbances:none  Narrative Interpretation: Normal EKG  Old EKG Reviewed: none available  10:50 AM Results for orders placed during the hospital encounter of 08/21/11  CBC      Component Value Range   WBC 5.7  4.0 - 10.5 (K/uL)   RBC 4.13  3.87 - 5.11 (MIL/uL)   Hemoglobin 12.3  12.0 - 15.0 (g/dL)   HCT 16.1 (*) 09.6 - 46.0 (%)   MCV 85.5  78.0 - 100.0 (fL)   MCH 29.8  26.0 - 34.0 (pg)   MCHC 34.8  30.0 - 36.0 (g/dL)   RDW 04.5  40.9 - 81.1 (%)   Platelets 155  150 - 400 (K/uL)  DIFFERENTIAL      Component Value Range   Neutrophils Relative 77  43 - 77 (%)   Neutro Abs 4.4  1.7 - 7.7 (K/uL)   Lymphocytes Relative 16  12 - 46 (%)   Lymphs Abs 0.9  0.7 - 4.0 (K/uL)   Monocytes Relative 6  3 - 12 (%)   Monocytes Absolute 0.3  0.1 - 1.0 (K/uL)   Eosinophils Relative 1  0 - 5 (%)   Eosinophils Absolute 0.1  0.0 - 0.7 (K/uL)   Basophils Relative 0  0 - 1 (%)   Basophils  Absolute 0.0  0.0 - 0.1 (K/uL)  COMPREHENSIVE METABOLIC PANEL      Component Value Range   Sodium 137  135 - 145 (mEq/L)   Potassium 3.5  3.5 - 5.1 (mEq/L)   Chloride 100  96 - 112 (mEq/L)   CO2 27  19 - 32 (mEq/L)   Glucose, Bld 151 (*) 70 - 99 (mg/dL)   BUN 9  6 - 23 (mg/dL)   Creatinine, Ser 9.14  0.50 - 1.10 (mg/dL)   Calcium 9.8  8.4 - 78.2 (mg/dL)   Total Protein 6.4  6.0 - 8.3 (g/dL)   Albumin 3.9  3.5 - 5.2 (g/dL)   AST 17  0 -  37 (U/L)   ALT 9  0 - 35 (U/L)   Alkaline Phosphatase 73  39 - 117 (U/L)   Total Bilirubin 0.8  0.3 - 1.2 (mg/dL)   GFR calc non Af Amer >90  >90 (mL/min)   GFR calc Af Amer >90  >90 (mL/min)  LIPASE, BLOOD      Component Value Range   Lipase 35  11 - 59 (U/L)  URINALYSIS, ROUTINE W REFLEX MICROSCOPIC      Component Value Range   Color, Urine YELLOW  YELLOW    APPearance CLEAR  CLEAR    Specific Gravity, Urine 1.026  1.005 - 1.030    pH 7.0  5.0 - 8.0    Glucose, UA NEGATIVE  NEGATIVE (mg/dL)   Hgb urine dipstick NEGATIVE  NEGATIVE    Bilirubin Urine NEGATIVE  NEGATIVE    Ketones, ur 15 (*) NEGATIVE (mg/dL)   Protein, ur NEGATIVE  NEGATIVE (mg/dL)   Urobilinogen, UA 0.2  0.0 - 1.0 (mg/dL)   Nitrite NEGATIVE  NEGATIVE    Leukocytes, UA NEGATIVE  NEGATIVE   CARDIAC PANEL(CRET KIN+CKTOT+MB+TROPI)      Component Value Range   Total CK 33  7 - 177 (U/L)   CK, MB 2.3  0.3 - 4.0 (ng/mL)   Troponin I <0.30  <0.30 (ng/mL)   Relative Index RELATIVE INDEX IS INVALID  0.0 - 2.5    Ct Abdomen Pelvis W Contrast  08/21/2011  *RADIOLOGY REPORT*  Clinical Data: Epigastric pain, nausea.  History of gastric lymphoma with prior chemotherapy.  CT ABDOMEN AND PELVIS WITH CONTRAST  Technique:  Multidetector CT imaging of the abdomen and pelvis was performed following the standard protocol during bolus administration of intravenous contrast.  Contrast: OMNIPAQUE IOHEXOL 300 MG/ML IV SOLN none.  Comparison: None.  Findings: Bibasilar atelectasis.  Heart is  upper limits normal in size.  No effusions.  There are mildly enlarged right cardiophrenic lymph nodes.  Lymph node on image 7 measures 13 mm.  Lymph node on image 19 anterior to the liver measures 12 mm.  On the same image, a lymph node anterior to the right heart measures 9 mm.  These are short axis measurements.  Borderline and mildly enlarged celiac axis and porta hepatis lymph nodes.  Portacaval lymph node on image 25 has a short axis diameter of 19 mm.  There is extensive retroperitoneal adenopathy.  Left periaortic lymph node on image 38 has a short axis diameter of 23 mm.  Aortocaval lymph node on the same image has a short axis diameter of 20 mm.  Adenopathy continues into the iliac chains bilaterally, right much greater than left.  Enlarged lymph node at the common iliac bifurcation on the right has a short axis diameter of 26 mm.  External iliac node on image 73 on the right has a short axis diameter of 24 mm.  Left external iliac node on image 73 has a short axis diameter of 12 mm.  Bilateral inguinal adenopathy also noted, right greater than left.  Small amount of free fluid in the pelvis.  Uterus, adnexa urinary bladder are grossly unremarkable.  Liver, spleen, pancreas, adrenals and kidneys are normal.  Stomach is grossly unremarkable. There is mild haziness throughout the mesentery with borderline and mildly enlarged mesenteric lymph nodes.  Large and small bowel are grossly unremarkable.  Aorta is calcified, non-aneurysmal.  IMPRESSION: Extensive of abdominal adenopathy including retroperitoneal, mesenteric, porta hepatis and celiac axis.  This continues in the pelvis within  the iliac chains and pelvic sidewall, right greater than left.  Also, inguinal adenopathy, right greater than left. There is a right cardiophrenic angle adenopathy as well. Findings findings most compatible with lymphoma.  Small amount of free fluid in the pelvis.  Dependent atelectasis in the lung bases.  Original Report  Authenticated By: Cyndie Chime, M.D.   US Venous Img Lower Bilateral  08/19/2011  *RADIOLOGY REPORT*  Clinical Data: Right lower extremity edema.  VENOUS DUPLEX ULTRASOUND OF BILATERAL LOWER EXTREMITIES  Technique:  Gray-scale sonography with graded compression, as well as color Doppler and duplex ultrasound, were performed to evaluate the deep venous system of both lower extremities from the level of the common femoral vein through the popliteal and proximal calf veins.  Spectral Doppler was utilized to evaluate flow at rest and with distal augmentation maneuvers.  Comparison:  None.  Findings: From the level of the common femoral vein to the popliteal vein, there is adequate color flow, compression and augmentation without evidence of a lower extremity deep venous thrombosis on either side.  Superficial veins are compressible as are visualized calf veins.  Reflux is noted bilaterally.  Bilateral inguinal adenopathy.  IMPRESSION: No evidence of lower extremity deep venous thrombosis on either side.  Bilateral inguinal adenopathy.  Etiology/significance indeterminate.  Original Report Authenticated By: Fuller Canada, M.D.    10:50 AM Patient's CT scan of the abdomen and pelvis shows extensive retroperitoneal lymph nodes. There is no perforation or free air. Laboratory tests are relatively normal. She has gotten relief from her pain and nausea medicine. Call placed to try at hospitalists to admit her, for pain control and to see if she needs additional treatment for her GI lymphoma.   Date: 08/21/2011  Rate:65  Rhythm: normal sinus rhythm  QRS Axis: normal  Intervals: normal  ST/T Wave abnormalities: normal  Conduction Disutrbances:none  Narrative Interpretation: Normal EKG  Old EKG Reviewed: none available    1. Abdominal  pain, other specified site   2. Lymphoma         Carleene Cooper III, MD 08/21/11 1117  Carleene Cooper III, MD 08/21/11 [phone]

## 2011-08-21 NOTE — ED Notes (Signed)
Patient transported to CT 

## 2011-08-21 NOTE — ED Notes (Signed)
Patient is resting comfortably. 

## 2011-08-21 NOTE — ED Notes (Signed)
Patient made aware of need for urine sample.

## 2011-08-21 NOTE — ED Notes (Signed)
Per ems, "call came out as chest pain but once on scene pt presented with abdominal and epigastric pain.  12 lead was unremarkable, pt has hx of stomach cancer and gallbladder problems.  Pt hypertensive on scene, hx of but has not taken her Hypertensive meds.  Onset of pain was 2 hours prior to call.  Denies n/v."

## 2011-08-22 ENCOUNTER — Encounter (HOSPITAL_COMMUNITY)
Admission: EM | Disposition: A | Discharge status: Home / Self Care | Payer: Self-pay | Source: Home / Self Care | Attending: Internal Medicine

## 2011-08-22 ENCOUNTER — Encounter (HOSPITAL_COMMUNITY): Payer: Self-pay | Admitting: Gastroenterology

## 2011-08-22 ENCOUNTER — Other Ambulatory Visit: Payer: Self-pay | Admitting: Gastroenterology

## 2011-08-22 HISTORY — PX: ESOPHAGOGASTRODUODENOSCOPY: SHX5428

## 2011-08-22 LAB — BASIC METABOLIC PANEL
BUN: 6 mg/dL (ref 6–23)
CO2: 31 mEq/L (ref 19–32)
Calcium: 9.5 mg/dL (ref 8.4–10.5)
Creatinine, Ser: 0.86 mg/dL (ref 0.50–1.10)
Glucose, Bld: 104 mg/dL — ABNORMAL HIGH (ref 70–99)

## 2011-08-22 LAB — CBC
Hemoglobin: 11.5 g/dL — ABNORMAL LOW (ref 12.0–15.0)
MCH: 29.1 pg (ref 26.0–34.0)
MCV: 87.6 fL (ref 78.0–100.0)
RBC: 3.95 MIL/uL (ref 3.87–5.11)

## 2011-08-22 LAB — URINE CULTURE: Colony Count: NO GROWTH

## 2011-08-22 LAB — GLUCOSE, CAPILLARY: Glucose-Capillary: 88 mg/dL (ref 70–99)

## 2011-08-22 SURGERY — EGD (ESOPHAGOGASTRODUODENOSCOPY)
Anesthesia: Moderate Sedation

## 2011-08-22 MED ORDER — ENOXAPARIN SODIUM 40 MG/0.4ML ~~LOC~~ SOLN
40.0000 mg | SUBCUTANEOUS | Status: DC
  Filled 2011-08-22 (×2): qty 0.4

## 2011-08-22 MED ORDER — MIDAZOLAM HCL 10 MG/2ML IJ SOLN
INTRAMUSCULAR | Status: AC
  Filled 2011-08-22: qty 4

## 2011-08-22 MED ORDER — GLYCOPYRROLATE 0.2 MG/ML IJ SOLN
INTRAMUSCULAR | Status: DC | PRN
  Administered 2011-08-22: 0.2 mg via INTRAVENOUS

## 2011-08-22 MED ORDER — HYDROCODONE-ACETAMINOPHEN 5-500 MG PO CAPS: 1.0000 | ORAL_CAPSULE | Freq: Four times a day (QID) | ORAL | Status: AC | PRN

## 2011-08-22 MED ORDER — OMEPRAZOLE MAGNESIUM 20 MG PO TBEC: 20.0000 mg | DELAYED_RELEASE_TABLET | Freq: Two times a day (BID) | ORAL | Status: DC

## 2011-08-22 MED ORDER — FENTANYL CITRATE 0.05 MG/ML IJ SOLN
INTRAMUSCULAR | Status: AC
  Filled 2011-08-22: qty 4

## 2011-08-22 MED ORDER — FENTANYL CITRATE 0.05 MG/ML IJ SOLN
INTRAMUSCULAR | Status: DC | PRN
  Administered 2011-08-22 (×3): 25 ug via INTRAVENOUS

## 2011-08-22 MED ORDER — GLYCOPYRROLATE 0.2 MG/ML IJ SOLN
INTRAMUSCULAR | Status: AC
  Filled 2011-08-22: qty 1

## 2011-08-22 MED ORDER — PANTOPRAZOLE SODIUM 40 MG PO TBEC: 40.0000 mg | DELAYED_RELEASE_TABLET | Freq: Two times a day (BID) | ORAL | Status: DC

## 2011-08-22 MED ORDER — MIDAZOLAM HCL 10 MG/2ML IJ SOLN
INTRAMUSCULAR | Status: DC | PRN
  Administered 2011-08-22 (×3): 2 mg via INTRAVENOUS

## 2011-08-22 MED ORDER — BUTAMBEN-TETRACAINE-BENZOCAINE 2-2-14 % EX AERO
INHALATION_SPRAY | CUTANEOUS | Status: DC | PRN
  Administered 2011-08-22: 2 via TOPICAL

## 2011-08-22 NOTE — Progress Notes (Signed)
Subjective: Abdominal pain improved.  Objective: Vital signs in last 24 hours: Filed Vitals:   08/22/11 1302 08/22/11 1312 08/22/11 1322 08/22/11 1411  BP: 107/55 108/55 110/56 111/68  Pulse:    96  Temp:    97.5 F (36.4 C)  TempSrc:    Oral  Resp: 16 17 16 16   Height:      Weight:      SpO2: 97% 98% 98% 95%   Weight change:   Intake/Output Summary (Last 24 hours) at 08/22/11 1742 Last data filed at 08/22/11 1507  Gross per 24 hour  Intake      0 ml  Output      0 ml  Net      0 ml    Physical Exam: General: Awake, Oriented, No acute distress. HEENT: EOMI. Neck: Supple CV: S1 and S2 Lungs: Clear to ascultation bilaterally Abdomen: Soft, Nontender, Nondistended, +bowel sounds. Ext: Good pulses. Trace edema.  Lab Results:  [facility] 08/22/11 0435 08/21/11 0744  NA 139 137  K 3.8 3.5  CL 103 100  CO2 31 27  GLUCOSE 104* 151*  BUN 6 9  CREATININE 0.86 0.72  CALCIUM 9.5 9.8  MG -- --  PHOS -- --    Basename 08/21/11 0744  AST 17  ALT 9  ALKPHOS 73  BILITOT 0.8  PROT 6.4  ALBUMIN 3.9    Basename 08/21/11 0744  LIPASE 35  AMYLASE --    Basename 08/22/11 0435 08/21/11 0744  WBC 5.2 5.7  NEUTROABS -- 4.4  HGB 11.5* 12.3  HCT 34.6* 35.3*  MCV 87.6 85.5  PLT 142* 155    Basename 08/21/11 2040 08/21/11 1446 08/21/11 0744  CKTOTAL 25 31 33  CKMB 2.4 2.4 2.3  CKMBINDEX -- -- --  TROPONINI <0.30 <0.30 <0.30   No components found with this basename: POCBNP:3 No results found for this basename: DDIMER:2 in the last 72 hours No results found for this basename: HGBA1C:2 in the last 72 hours No results found for this basename: CHOL:2,HDL:2,LDLCALC:2,TRIG:2,CHOLHDL:2,LDLDIRECT:2 in the last 72 hours No results found for this basename: TSH,T4TOTAL,FREET3,T3FREE,THYROIDAB in the last 72 hours No results found for this basename: VITAMINB12:2,FOLATE:2,FERRITIN:2,TIBC:2,IRON:2,RETICCTPCT:2 in the last 72 hours  Micro Results: Recent Results (from the past  240 hour(s))  URINE CULTURE     Status: Normal   Collection Time   08/21/11 10:19 AM      Component Value Range Status Comment   Specimen Description URINE, CLEAN CATCH   Final    Special Requests NONE   Final    Setup Time [phone]   Final    Colony Count NO GROWTH   Final    Culture NO GROWTH   Final    Report Status 08/22/2011 FINAL   Final     Studies/Results: Ct Abdomen Pelvis W Contrast  08/21/2011  *RADIOLOGY REPORT*  Clinical Data: Epigastric pain, nausea.  History of gastric lymphoma with prior chemotherapy.  CT ABDOMEN AND PELVIS WITH CONTRAST  Technique:  Multidetector CT imaging of the abdomen and pelvis was performed following the standard protocol during bolus administration of intravenous contrast.  Contrast: OMNIPAQUE IOHEXOL 300 MG/ML IV SOLN none.  Comparison: None.  Findings: Bibasilar atelectasis.  Heart is upper limits normal in size.  No effusions.  There are mildly enlarged right cardiophrenic lymph nodes.  Lymph node on image 7 measures 13 mm.  Lymph node on image 19 anterior to the liver measures 12 mm.  On the same image, a lymph node  anterior to the right heart measures 9 mm.  These are short axis measurements.  Borderline and mildly enlarged celiac axis and porta hepatis lymph nodes.  Portacaval lymph node on image 25 has a short axis diameter of 19 mm.  There is extensive retroperitoneal adenopathy.  Left periaortic lymph node on image 38 has a short axis diameter of 23 mm.  Aortocaval lymph node on the same image has a short axis diameter of 20 mm.  Adenopathy continues into the iliac chains bilaterally, right much greater than left.  Enlarged lymph node at the common iliac bifurcation on the right has a short axis diameter of 26 mm.  External iliac node on image 73 on the right has a short axis diameter of 24 mm.  Left external iliac node on image 73 has a short axis diameter of 12 mm.  Bilateral inguinal adenopathy also noted, right greater than left.  Small  amount of free fluid in the pelvis.  Uterus, adnexa urinary bladder are grossly unremarkable.  Liver, spleen, pancreas, adrenals and kidneys are normal.  Stomach is grossly unremarkable. There is mild haziness throughout the mesentery with borderline and mildly enlarged mesenteric lymph nodes.  Large and small bowel are grossly unremarkable.  Aorta is calcified, non-aneurysmal.  IMPRESSION: Extensive of abdominal adenopathy including retroperitoneal, mesenteric, porta hepatis and celiac axis.  This continues in the pelvis within the iliac chains and pelvic sidewall, right greater than left.  Also, inguinal adenopathy, right greater than left. There is a right cardiophrenic angle adenopathy as well. Findings findings most compatible with lymphoma.  Small amount of free fluid in the pelvis.  Dependent atelectasis in the lung bases.  Original Report Authenticated By: Cyndie Chime, M.D.    Medications: I have reviewed the patient's current medications. Scheduled Meds:   . enoxaparin (LOVENOX) injection  40 mg Subcutaneous Q24H  . insulin aspart  0-9 Units Subcutaneous TID WC  . ondansetron (ZOFRAN) IV  4 mg Intravenous Once  . pantoprazole  40 mg Oral BID AC  . ramipril  20 mg Oral Daily  . venlafaxine  75 mg Oral Daily  . DISCONTD: enoxaparin  30 mg Subcutaneous Q24H  . DISCONTD: pantoprazole (PROTONIX) IV  40 mg Intravenous Q12H   Continuous Infusions:   . sodium chloride 75 mL/hr at 08/22/11 0700  . DISCONTD: sodium chloride 125 mL/hr at 08/21/11 0751   PRN Meds:.acetaminophen, acetaminophen, ALPRAZolam, morphine, oxyCODONE, DISCONTD: butamben-tetracaine-benzocaine, DISCONTD: fentaNYL, DISCONTD: glycopyrrolate, DISCONTD: midazolam, DISCONTD:  morphine injection  Assessment/Plan: 1. Epigastric pain - had EGD today which showed benign-appearing fundic polyps.  Abdominal pain is likely due to lymphoma.  Continue twice a day PPI.  2. Non-Hodgkin's lymphoma - Attempting to obtain records from  her oncologist. Continue management as per outpatient. Per radiology the largest lymph node is 2.4 cm.   3. GERD (gastroesophageal reflux disease) - Continue twice a day IV PPI.   4. HTN (hypertension) - stable at this time continue home medications.   5. DM type 2 (diabetes mellitus, type 2) - hold metformin as the patient received contrast for the CT. Will have the patient on sensitive sliding scale insulin.  6. Discharge the patient home today.  Instructed the patient to contact her insurance to determine which prior care physician in town is within her network provider and establish care with a primary care physician locally.  After establishing care with a primary care physician instructed the patient to have her primary care physician for the patient to oncologist  here if the patient is planning on transitioning her care from [place] to [place].   LOS: 1 day  Victoria Wood A, MD 08/22/2011, 5:42 PM

## 2011-08-22 NOTE — Discharge Summary (Signed)
Discharge Summary  Victoria Wood MR#: [id number]  DOB:1951-02-18  Date of Admission: 08/21/2011 Date of Discharge: 08/22/2011  Patient's PCP: No primary provider on file. Patient has care established in [place].  Attending Physician:Amariya Liskey A  Consults: Treatment Team:  Louis Meckel, MD (GI)   Discharge Diagnoses: Principal Problem:  *Epigastric pain Active Problems:  Non-Hodgkin's lymphoma  GERD (gastroesophageal reflux disease)  HTN (hypertension)  DM type 2 (diabetes mellitus, type 2)  Brief Admitting History and Physical 60 year old Caucasian female with history of non-Hodgkin's lymphoma who is received Rituxan has a history of GERD, hypertension, type 2 diabetes who presented on 08/21/2011 with complaints of epigastric pain.  Discharge Medications Current Discharge Medication List    START taking these medications   Details  hydrocodone-acetaminophen (LORCET-HD) 5-500 MG per capsule Take 1 capsule by mouth every 6 (six) hours as needed for pain. Qty: 20 capsule, Refills: 0    omeprazole (PRILOSEC OTC) 20 MG tablet Take 1 tablet (20 mg total) by mouth 2 (two) times daily. Qty: 60 tablet, Refills: 0      CONTINUE these medications which have NOT CHANGED   Details  ALPRAZolam (XANAX) 0.25 MG tablet Take 0.25 mg by mouth 2 (two) times daily as needed.      Cyanocobalamin (VITAMIN B 12 PO) Take 1 tablet by mouth daily.      metFORMIN (GLUCOPHAGE) 500 MG tablet Take 500 mg by mouth 2 (two) times daily with a meal.      OVER THE COUNTER MEDICATION Take 3 tablets by mouth daily. Procera vitamin     ramipril (ALTACE) 10 MG tablet Take 20 mg by mouth daily.      venlafaxine (EFFEXOR-XR) 75 MG 24 hr capsule Take 75 mg by mouth daily.          Hospital Course: 1. Epigastric pain - Had EGD on 08/22/2011 which showed benign-appearing fundic polyps. Abdominal pain is likely due to lymphoma.  Improved with pain management.  Prior to discharge patient was eating.   Continue twice a day PPI.   2. Non-Hodgkin's lymphoma - Attempting to obtain records from her oncologist. Continue management as per outpatient. Per radiology the largest lymph node is 2.4 cm.   3. GERD (gastroesophageal reflux disease) - Continue twice a day IV PPI.   4. HTN (hypertension) - stable at this time continue home medications.   5. DM type 2 (diabetes mellitus, type 2) - hold metformin as the patient received contrast for the CT. Will have the patient on sensitive sliding scale insulin.   6. Long term management.  If the patient is planning on moving to [place], instructed the patient to contact her insurance to determine which prior care physician in town is within her network provider and establish care with a primary care physician locally. After establishing care with a primary care physician instructed the patient to have her primary care physician for the patient to oncologist here, if the patient is planning on transitioning her care from [place] to [facility].   Day of Discharge BP 111/68  Pulse 96  Temp(Src) 97.5 F (36.4 C) (Oral)  Resp 16  Ht 5\' 5"  (1.651 m)  Wt 67.994 kg (149 lb 14.4 oz)  BMI 24.94 kg/m2  SpO2 95%  Results for orders placed during the hospital encounter of 08/21/11 (from the past 48 hour(s))  CBC     Status: Abnormal   Collection Time   08/21/11  7:44 AM      Component Value Range  Comment   WBC 5.7  4.0 - 10.5 (K/uL)    RBC 4.13  3.87 - 5.11 (MIL/uL)    Hemoglobin 12.3  12.0 - 15.0 (g/dL)    HCT 16.1 (*) 09.6 - 46.0 (%)    MCV 85.5  78.0 - 100.0 (fL)    MCH 29.8  26.0 - 34.0 (pg)    MCHC 34.8  30.0 - 36.0 (g/dL)    RDW 04.5  40.9 - 81.1 (%)    Platelets 155  150 - 400 (K/uL)   DIFFERENTIAL     Status: Normal   Collection Time   08/21/11  7:44 AM      Component Value Range Comment   Neutrophils Relative 77  43 - 77 (%)    Neutro Abs 4.4  1.7 - 7.7 (K/uL)    Lymphocytes Relative 16  12 - 46 (%)    Lymphs Abs 0.9  0.7 - 4.0  (K/uL)    Monocytes Relative 6  3 - 12 (%)    Monocytes Absolute 0.3  0.1 - 1.0 (K/uL)    Eosinophils Relative 1  0 - 5 (%)    Eosinophils Absolute 0.1  0.0 - 0.7 (K/uL)    Basophils Relative 0  0 - 1 (%)    Basophils Absolute 0.0  0.0 - 0.1 (K/uL)   COMPREHENSIVE METABOLIC PANEL     Status: Abnormal   Collection Time   08/21/11  7:44 AM      Component Value Range Comment   Sodium 137  135 - 145 (mEq/L)    Potassium 3.5  3.5 - 5.1 (mEq/L)    Chloride 100  96 - 112 (mEq/L)    CO2 27  19 - 32 (mEq/L)    Glucose, Bld 151 (*) 70 - 99 (mg/dL)    BUN 9  6 - 23 (mg/dL)    Creatinine, Ser 9.14  0.50 - 1.10 (mg/dL)    Calcium 9.8  8.4 - 10.5 (mg/dL)    Total Protein 6.4  6.0 - 8.3 (g/dL)    Albumin 3.9  3.5 - 5.2 (g/dL)    AST 17  0 - 37 (U/L)    ALT 9  0 - 35 (U/L)    Alkaline Phosphatase 73  39 - 117 (U/L)    Total Bilirubin 0.8  0.3 - 1.2 (mg/dL)    GFR calc non Af Amer >90  >90 (mL/min)    GFR calc Af Amer >90  >90 (mL/min)   LIPASE, BLOOD     Status: Normal   Collection Time   08/21/11  7:44 AM      Component Value Range Comment   Lipase 35  11 - 59 (U/L)   CARDIAC PANEL(CRET KIN+CKTOT+MB+TROPI)     Status: Normal   Collection Time   08/21/11  7:44 AM      Component Value Range Comment   Total CK 33  7 - 177 (U/L)    CK, MB 2.3  0.3 - 4.0 (ng/mL)    Troponin I <0.30  <0.30 (ng/mL)    Relative Index RELATIVE INDEX IS INVALID  0.0 - 2.5    URINALYSIS, ROUTINE W REFLEX MICROSCOPIC     Status: Abnormal   Collection Time   08/21/11 10:19 AM      Component Value Range Comment   Color, Urine YELLOW  YELLOW     APPearance CLEAR  CLEAR     Specific Gravity, Urine 1.026  1.005 - 1.030     pH  7.0  5.0 - 8.0     Glucose, UA NEGATIVE  NEGATIVE (mg/dL)    Hgb urine dipstick NEGATIVE  NEGATIVE     Bilirubin Urine NEGATIVE  NEGATIVE     Ketones, ur 15 (*) NEGATIVE (mg/dL)    Protein, ur NEGATIVE  NEGATIVE (mg/dL)    Urobilinogen, UA 0.2  0.0 - 1.0 (mg/dL)    Nitrite NEGATIVE   NEGATIVE     Leukocytes, UA NEGATIVE  NEGATIVE  MICROSCOPIC NOT DONE ON URINES WITH NEGATIVE PROTEIN, BLOOD, LEUKOCYTES, NITRITE, OR GLUCOSE <1000 mg/dL.  URINE CULTURE     Status: Normal   Collection Time   08/21/11 10:19 AM      Component Value Range Comment   Specimen Description URINE, CLEAN CATCH      Special Requests NONE      Setup Time [phone]      Colony Count NO GROWTH      Culture NO GROWTH      Report Status 08/22/2011 FINAL     CARDIAC PANEL(CRET KIN+CKTOT+MB+TROPI)     Status: Normal   Collection Time   08/21/11  2:46 PM      Component Value Range Comment   Total CK 31  7 - 177 (U/L)    CK, MB 2.4  0.3 - 4.0 (ng/mL)    Troponin I <0.30  <0.30 (ng/mL)    Relative Index RELATIVE INDEX IS INVALID  0.0 - 2.5    GLUCOSE, CAPILLARY     Status: Normal   Collection Time   08/21/11  5:27 PM      Component Value Range Comment   Glucose-Capillary 95  70 - 99 (mg/dL)    Comment 1 Documented in Chart      Comment 2 Notify RN     CARDIAC PANEL(CRET KIN+CKTOT+MB+TROPI)     Status: Normal   Collection Time   08/21/11  8:40 PM      Component Value Range Comment   Total CK 25  7 - 177 (U/L)    CK, MB 2.4  0.3 - 4.0 (ng/mL)    Troponin I <0.30  <0.30 (ng/mL)    Relative Index RELATIVE INDEX IS INVALID  0.0 - 2.5    GLUCOSE, CAPILLARY     Status: Normal   Collection Time   08/21/11 10:49 PM      Component Value Range Comment   Glucose-Capillary 95  70 - 99 (mg/dL)    Comment 1 Documented in Chart      Comment 2 Notify RN     CBC     Status: Abnormal   Collection Time   08/22/11  4:35 AM      Component Value Range Comment   WBC 5.2  4.0 - 10.5 (K/uL)    RBC 3.95  3.87 - 5.11 (MIL/uL)    Hemoglobin 11.5 (*) 12.0 - 15.0 (g/dL)    HCT 81.1 (*) 91.4 - 46.0 (%)    MCV 87.6  78.0 - 100.0 (fL)    MCH 29.1  26.0 - 34.0 (pg)    MCHC 33.2  30.0 - 36.0 (g/dL)    RDW 78.2  95.6 - 21.3 (%)    Platelets 142 (*) 150 - 400 (K/uL)   BASIC METABOLIC PANEL     Status: Abnormal    Collection Time   08/22/11  4:35 AM      Component Value Range Comment   Sodium 139  135 - 145 (mEq/L)    Potassium 3.8  3.5 -  5.1 (mEq/L)    Chloride 103  96 - 112 (mEq/L)    CO2 31  19 - 32 (mEq/L)    Glucose, Bld 104 (*) 70 - 99 (mg/dL)    BUN 6  6 - 23 (mg/dL)    Creatinine, Ser 1.61  0.50 - 1.10 (mg/dL)    Calcium 9.5  8.4 - 10.5 (mg/dL)    GFR calc non Af Amer 71 (*) >90 (mL/min)    GFR calc Af Amer 82 (*) >90 (mL/min)   GLUCOSE, CAPILLARY     Status: Abnormal   Collection Time   08/22/11  6:52 AM      Component Value Range Comment   Glucose-Capillary 100 (*) 70 - 99 (mg/dL)    Comment 1 Documented in Chart      Comment 2 Notify RN     GLUCOSE, CAPILLARY     Status: Normal   Collection Time   08/22/11 11:30 AM      Component Value Range Comment   Glucose-Capillary 88  70 - 99 (mg/dL)     Ct Abdomen Pelvis W Contrast  08/21/2011  *RADIOLOGY REPORT*  Clinical Data: Epigastric pain, nausea.  History of gastric lymphoma with prior chemotherapy.  CT ABDOMEN AND PELVIS WITH CONTRAST  Technique:  Multidetector CT imaging of the abdomen and pelvis was performed following the standard protocol during bolus administration of intravenous contrast.  Contrast: OMNIPAQUE IOHEXOL 300 MG/ML IV SOLN none.  Comparison: None.  Findings: Bibasilar atelectasis.  Heart is upper limits normal in size.  No effusions.  There are mildly enlarged right cardiophrenic lymph nodes.  Lymph node on image 7 measures 13 mm.  Lymph node on image 19 anterior to the liver measures 12 mm.  On the same image, a lymph node anterior to the right heart measures 9 mm.  These are short axis measurements.  Borderline and mildly enlarged celiac axis and porta hepatis lymph nodes.  Portacaval lymph node on image 25 has a short axis diameter of 19 mm.  There is extensive retroperitoneal adenopathy.  Left periaortic lymph node on image 38 has a short axis diameter of 23 mm.  Aortocaval lymph node on the same image has a  short axis diameter of 20 mm.  Adenopathy continues into the iliac chains bilaterally, right much greater than left.  Enlarged lymph node at the common iliac bifurcation on the right has a short axis diameter of 26 mm.  External iliac node on image 73 on the right has a short axis diameter of 24 mm.  Left external iliac node on image 73 has a short axis diameter of 12 mm.  Bilateral inguinal adenopathy also noted, right greater than left.  Small amount of free fluid in the pelvis.  Uterus, adnexa urinary bladder are grossly unremarkable.  Liver, spleen, pancreas, adrenals and kidneys are normal.  Stomach is grossly unremarkable. There is mild haziness throughout the mesentery with borderline and mildly enlarged mesenteric lymph nodes.  Large and small bowel are grossly unremarkable.  Aorta is calcified, non-aneurysmal.  IMPRESSION: Extensive of abdominal adenopathy including retroperitoneal, mesenteric, porta hepatis and celiac axis.  This continues in the pelvis within the iliac chains and pelvic sidewall, right greater than left.  Also, inguinal adenopathy, right greater than left. There is a right cardiophrenic angle adenopathy as well. Findings findings most compatible with lymphoma.  Small amount of free fluid in the pelvis.  Dependent atelectasis in the lung bases.  Original Report Authenticated By: Cyndie Chime,  M.D.   US Venous Img Lower Bilateral  08/19/2011  *RADIOLOGY REPORT*  Clinical Data: Right lower extremity edema.  VENOUS DUPLEX ULTRASOUND OF BILATERAL LOWER EXTREMITIES  Technique:  Gray-scale sonography with graded compression, as well as color Doppler and duplex ultrasound, were performed to evaluate the deep venous system of both lower extremities from the level of the common femoral vein through the popliteal and proximal calf veins.  Spectral Doppler was utilized to evaluate flow at rest and with distal augmentation maneuvers.  Comparison:  None.  Findings: From the level of the common  femoral vein to the popliteal vein, there is adequate color flow, compression and augmentation without evidence of a lower extremity deep venous thrombosis on either side.  Superficial veins are compressible as are visualized calf veins.  Reflux is noted bilaterally.  Bilateral inguinal adenopathy.  IMPRESSION: No evidence of lower extremity deep venous thrombosis on either side.  Bilateral inguinal adenopathy.  Etiology/significance indeterminate.  Original Report Authenticated By: Fuller Canada, M.D.    Disposition: Home  Diet: Heart healthy diet  Activity: Resume as tolerated.  Follow-up Appts: Discharge Orders    Future Orders Please Complete By Expires   Diet - low sodium heart healthy      Increase activity slowly      Discharge instructions      Comments:   Please contact your insurance to determine which physician locally is within your network provider and determine if they are accepting new patients and arrange for a followup to establish care.  After being establishing care with a primary care physician please request your primary care physician to arrange for a referral to oncology for management of your lymphoma.      TESTS THAT NEED FOLLOW-UP None  Time spent on discharge, talking to the patient, and coordinating care: 35 mins.   Signed: Cristal Ford, MD 08/22/2011, 5:56 PM

## 2011-08-22 NOTE — Progress Notes (Signed)
Patient ID: Victoria Wood, female   DOB: 1950/10/25, 60 y.o.   MRN: [id number] [facility] Progress Note  Subjective: Vomited last night, but less abdominal pain today. Long discussion with pt and daughter. They would like her to continue treatment in [place], and manage sxs while she is here for [place].  Objective:  Vital signs in last 24 hours: Temp:  [97.5 F (36.4 C)-98.5 F (36.9 C)] 98.2 F (36.8 C) (12/13 0654) Pulse Rate:  [66-79] 70  (12/13 0654) Resp:  [14-18] 16  (12/13 0654) BP: (112-139)/(59-69) 125/66 mmHg (12/13 0654) SpO2:  [90 %-100 %] 100 % (12/13 0654) Weight:  [67.5 kg (148 lb 13 oz)-67.994 kg (149 lb 14.4 oz)] 149 lb 14.4 oz (67.994 kg) (12/13 0654) Last BM Date: 08/21/11 General:   Alert,  Well-developed,    in NAD Heart:  Regular rate and rhythm; no murmurs Pulm;clear Abdomen:  Soft, mildly tender and nondistended. Normal bowel sounds, Extremities:  Without edema. Neurologic:  Alert and  oriented x4;  grossly normal neurologically. Psych:  Alert and cooperative. Normal mood and affect.  Intake/Output from previous day:   Intake/Output this shift:    Lab Results:  Basename 08/22/11 0435 08/21/11 0744  WBC 5.2 5.7  HGB 11.5* 12.3  HCT 34.6* 35.3*  PLT 142* 155   BMET  Basename 08/22/11 0435 08/21/11 0744  NA 139 137  K 3.8 3.5  CL 103 100  CO2 31 27  GLUCOSE 104* 151*  BUN 6 9  CREATININE 0.86 0.72  CALCIUM 9.5 9.8   LFT  Basename 08/21/11 0744  PROT 6.4  ALBUMIN 3.9  AST 17  ALT 9  ALKPHOS 73  BILITOT 0.8  BILIDIR --  IBILI --   PT/INR No results found for this basename: LABPROT:2,INR:2 in the last 72 hours Hepatitis Panel No results found for this basename: HEPBSAG,HCVAB,HEPAIGM,HEPBIGM in the last 72 hours  Assessment / Plan:  Stage 4 non Hodgkins lymphoma, abdominal pain precipitating admission. Suspect pain secondary to extensive abdominal disease-r/o gastric involvement, and or cheo induced  gastropathy/PUD. For EGD today Would anticipate discharge home later today, with PPi, Zofran, And Vicoden for prn use. Pt will follow up with her Oncologist in [place] in 2 weeks when returns home. Principal Problem:  *Epigastric pain Active Problems:  Non-Hodgkin's lymphoma  GERD (gastroesophageal reflux disease)  HTN (hypertension)  DM type 2 (diabetes mellitus, type 2)     LOS: 1 day   Victoria Wood  08/22/2011, 9:17 AM

## 2011-08-22 NOTE — Progress Notes (Signed)
Patient discharge home, no complaints of any pain or discomfort,PIV removed no s/s of swelling, no redness no infiltration. Porta cath de acces by IV Nurse.

## 2011-08-22 NOTE — Op Note (Signed)
[facility] [address] [place], [place]  [address]  ENDOSCOPY PROCEDURE REPORT  PATIENT:  Victoria Wood, Victoria Wood  MR#:  [id number] BIRTHDATE:  Oct 14, 1950, 60 yrs. old  GENDER:  female  ENDOSCOPIST:  Barbette Hair. Arlyce Dice, MD Referred by:  PROCEDURE DATE:  08/22/2011 PROCEDURE:  EGD with biopsy, [address] ASA CLASS:  Class II INDICATIONS:  abdominal pain history of lymphoma  MEDICATIONS:   These medications were titrated to patient response per physician's verbal order, Fentanyl 75 mcg IV, Versed 7.5 mg IV, glycopyrrolate (Robinal) 0.2 mg IV TOPICAL ANESTHETIC:  Cetacaine Spray  DESCRIPTION OF PROCEDURE:   After the risks and benefits of the procedure were explained, informed consent was obtained.  The [id number] ([id number]) endoscope was introduced through the mouth and advanced to the third portion of the duodenum.  The instrument was slowly withdrawn as the mucosa was fully examined. <<PROCEDUREIMAGES>>  There were multiple polyps identified. multiple fundic appearing 2-3 m sessile polyps in the gastric cardia and fundus. Biopsies were taken (see image007).  Otherwise the examination was normal (see image001, image002, image003, image004, image005, and image008).    Retroflexed views revealed no abnormalities.    The scope was then withdrawn from the patient and the procedure completed.  COMPLICATIONS:  None  ENDOSCOPIC IMPRESSION: 1) Polyps, multiple 2) Otherwise normal examination  Gastric polyps are very likely incidental. A valve pain is most likely related to her lymphadenopathy and lymphoma RECOMMENDATIONS: 1) Await biopsy results  ______________________________ Barbette Hair. Arlyce Dice, MD  CC:  n. eSIGNED:   Barbette Hair. Wyndi Northrup at 08/22/2011 12:48 PM  Wynn Maudlin, [phone]

## 2011-08-22 NOTE — Progress Notes (Signed)
Upper endoscopy demonstrated  benign appearing fundic polyps.    Abdominal pain is most likely related to lymphoma.  Medications #1 no further GI workup  Signing off

## 2011-08-23 ENCOUNTER — Encounter (HOSPITAL_COMMUNITY): Payer: Self-pay

## 2011-08-23 ENCOUNTER — Encounter (HOSPITAL_COMMUNITY): Payer: Self-pay | Admitting: Gastroenterology

## 2011-09-09 ENCOUNTER — Emergency Department (INDEPENDENT_AMBULATORY_CARE_PROVIDER_SITE_OTHER)
Admission: EM | Admit: 2011-09-09 | Discharge: 2011-09-09 | Disposition: A | Discharge status: Home / Self Care | Payer: BC Managed Care – PPO | Source: Home / Self Care | Attending: Family Medicine | Admitting: Family Medicine

## 2011-09-09 ENCOUNTER — Inpatient Hospital Stay (HOSPITAL_COMMUNITY)
Admission: EM | Admit: 2011-09-09 | Discharge: 2011-09-14 | DRG: 813 | Disposition: A | Discharge status: Home / Self Care | Payer: BC Managed Care – PPO | Attending: Internal Medicine | Admitting: Internal Medicine

## 2011-09-09 ENCOUNTER — Encounter (HOSPITAL_COMMUNITY): Payer: Self-pay | Admitting: *Deleted

## 2011-09-09 ENCOUNTER — Encounter (HOSPITAL_COMMUNITY): Payer: Self-pay

## 2011-09-09 DIAGNOSIS — R17 Unspecified jaundice: Secondary | ICD-10-CM | POA: Diagnosis present

## 2011-09-09 DIAGNOSIS — K7589 Other specified inflammatory liver diseases: Secondary | ICD-10-CM

## 2011-09-09 DIAGNOSIS — N390 Urinary tract infection, site not specified: Secondary | ICD-10-CM | POA: Diagnosis present

## 2011-09-09 DIAGNOSIS — C8590 Non-Hodgkin lymphoma, unspecified, unspecified site: Secondary | ICD-10-CM

## 2011-09-09 DIAGNOSIS — I10 Essential (primary) hypertension: Secondary | ICD-10-CM | POA: Diagnosis present

## 2011-09-09 DIAGNOSIS — K219 Gastro-esophageal reflux disease without esophagitis: Secondary | ICD-10-CM | POA: Diagnosis present

## 2011-09-09 DIAGNOSIS — R1013 Epigastric pain: Secondary | ICD-10-CM | POA: Diagnosis present

## 2011-09-09 DIAGNOSIS — R7401 Elevation of levels of liver transaminase levels: Secondary | ICD-10-CM | POA: Diagnosis present

## 2011-09-09 DIAGNOSIS — J45909 Unspecified asthma, uncomplicated: Secondary | ICD-10-CM | POA: Diagnosis present

## 2011-09-09 DIAGNOSIS — E119 Type 2 diabetes mellitus without complications: Secondary | ICD-10-CM | POA: Diagnosis present

## 2011-09-09 DIAGNOSIS — R7402 Elevation of levels of lactic acid dehydrogenase (LDH): Secondary | ICD-10-CM | POA: Diagnosis present

## 2011-09-09 DIAGNOSIS — R1011 Right upper quadrant pain: Secondary | ICD-10-CM

## 2011-09-09 DIAGNOSIS — D696 Thrombocytopenia, unspecified: Secondary | ICD-10-CM | POA: Diagnosis present

## 2011-09-09 DIAGNOSIS — C8589 Other specified types of non-Hodgkin lymphoma, extranodal and solid organ sites: Secondary | ICD-10-CM | POA: Diagnosis present

## 2011-09-09 HISTORY — DX: Non-Hodgkin lymphoma, unspecified, unspecified site: C85.90

## 2011-09-09 LAB — POCT URINALYSIS DIP (DEVICE)
Glucose, UA: NEGATIVE mg/dL
Hgb urine dipstick: NEGATIVE
Ketones, ur: NEGATIVE mg/dL
Nitrite: NEGATIVE
Protein, ur: NEGATIVE mg/dL
Specific Gravity, Urine: 1.01 (ref 1.005–1.030)
Urobilinogen, UA: 0.2 mg/dL (ref 0.0–1.0)
pH: 7 (ref 5.0–8.0)

## 2011-09-09 LAB — DIFFERENTIAL
Basophils Absolute: 0 10*3/uL (ref 0.0–0.1)
Basophils Relative: 0 % (ref 0–1)
Lymphocytes Relative: 10 % — ABNORMAL LOW (ref 12–46)
Monocytes Absolute: 0.5 10*3/uL (ref 0.1–1.0)
Monocytes Relative: 9 % (ref 3–12)
Neutro Abs: 4 10*3/uL (ref 1.7–7.7)
Neutrophils Relative %: 79 % — ABNORMAL HIGH (ref 43–77)

## 2011-09-09 LAB — CBC
HCT: 37.7 % (ref 36.0–46.0)
Hemoglobin: 12.4 g/dL (ref 12.0–15.0)
MCHC: 32.9 g/dL (ref 30.0–36.0)
RDW: 13.4 % (ref 11.5–15.5)
WBC: 5.1 10*3/uL (ref 4.0–10.5)

## 2011-09-09 LAB — URINE MICROSCOPIC-ADD ON

## 2011-09-09 LAB — URINALYSIS, ROUTINE W REFLEX MICROSCOPIC
Nitrite: NEGATIVE
Specific Gravity, Urine: 1.016 (ref 1.005–1.030)
pH: 6 (ref 5.0–8.0)

## 2011-09-09 MED ORDER — MORPHINE SULFATE 4 MG/ML IJ SOLN
4.0000 mg | Freq: Once | INTRAMUSCULAR | Status: AC
  Administered 2011-09-09: 4 mg via INTRAVENOUS
  Filled 2011-09-09: qty 1

## 2011-09-09 MED ORDER — SODIUM CHLORIDE 0.9 % IV BOLUS (SEPSIS)
1000.0000 mL | Freq: Once | INTRAVENOUS | Status: AC
  Administered 2011-09-09: 1000 mL via INTRAVENOUS

## 2011-09-09 MED ORDER — ONDANSETRON HCL 4 MG/2ML IJ SOLN
4.0000 mg | Freq: Once | INTRAMUSCULAR | Status: AC
  Administered 2011-09-09: 4 mg via INTRAVENOUS
  Filled 2011-09-09: qty 2

## 2011-09-09 NOTE — ED Provider Notes (Signed)
History     CSN: [id number]  Arrival date & time 09/09/11  1314   First MD Initiated Contact with Patient 09/09/11 1523      Chief Complaint  Patient presents with  . Abdominal Pain    (Consider location/radiation/quality/duration/timing/severity/associated sxs/prior treatment) Patient is a 60 y.o. female presenting with abdominal pain. The history is provided by the patient.  Abdominal Pain The primary symptoms of the illness include abdominal pain and nausea. The primary symptoms of the illness do not include fever, vomiting or diarrhea. The current episode started 2 days ago (hosp 12/12 for abd pain, , improved but acutely getting worse.). The onset of the illness was gradual. The problem has been gradually worsening.  The patient states that she believes she is currently not pregnant. The patient has had a change in bowel habit. Additional symptoms associated with the illness include anorexia and back pain.    Past Medical History  Diagnosis Date  . Gastric polyps     History of  . Cancer     hx of Rituxan in 07/2011  . Non-Hodgkin's lymphoma   . GERD (gastroesophageal reflux disease)   . HTN (hypertension)   . DM type 2 (diabetes mellitus, type 2)   . Asthma   . Non Hodgkin's lymphoma     Past Surgical History  Procedure Date  . Portacath placement   . Upper gastrointestinal endoscopy 02/2011    gastric polyps x 2  . Neck and thigh surgery     lymph nodes removed  . Esophagogastroduodenoscopy 08/22/2011    Procedure: ESOPHAGOGASTRODUODENOSCOPY (EGD);  Surgeon: Louis Meckel, MD;  Location: [facility];  Service: Endoscopy;  Laterality: N/A;    Family History  Problem Relation Age of Onset  . Hypertension Mother   . Hypertension Father   . Kidney disease Father     History  Substance Use Topics  . Smoking status: Never Smoker   . Smokeless tobacco: Never Used  . Alcohol Use: 0.0 oz/week     Occasional    OB History    Grav Para Term Preterm  Abortions TAB SAB Ect Mult Living                  Review of Systems  Constitutional: Positive for appetite change. Negative for fever.  Gastrointestinal: Positive for nausea, abdominal pain and anorexia. Negative for vomiting and diarrhea.  Genitourinary:       Dark urine noticed  Musculoskeletal: Positive for back pain.    Allergies  Penicillins and Shellfish allergy  Home Medications   No current outpatient prescriptions on file.  BP 122/61  Pulse 90  Temp(Src) 98.6 F (37 C) (Oral)  Resp 18  SpO2 100%  Physical Exam  Nursing note and vitals reviewed. Constitutional: She is oriented to person, place, and time. She appears well-developed and well-nourished.  Eyes:    Neck: Normal range of motion. Neck supple.  Cardiovascular: Normal rate, normal heart sounds and intact distal pulses.   Pulmonary/Chest: Effort normal.  Abdominal: Soft. Bowel sounds are normal. She exhibits no mass. There is tenderness. There is no rebound and no guarding.  Musculoskeletal: She exhibits edema.  Neurological: She is alert and oriented to person, place, and time.  Skin: Skin is warm and dry.    ED Course  Procedures (including critical care time)  Labs Reviewed  POCT URINALYSIS DIP (DEVICE) - Abnormal; Notable for the following:    Bilirubin Urine MODERATE (*)    Leukocytes, UA SMALL (*)  Biochemical Testing Only. Please order routine urinalysis from main lab if confirmatory testing is needed.   All other components within normal limits  LAB REPORT - SCANNED   US Abdomen Complete  09/10/2011  *RADIOLOGY REPORT*  Clinical Data:  Right upper quadrant pain and elevated liver function studies.  COMPLETE ABDOMINAL ULTRASOUND  Comparison:  CT 08/21/2011  Findings:  Gallbladder:  There is layering sludge in the gallbladder with mild wall thickening.  No stones are visualized. Murphy's sign is indeterminate due to patient sedation.  Common bile duct:  Normal caliber with measured diameter of  about 4 mm.  Liver:  No focal lesion identified.  Within normal limits in parenchymal echogenicity.  IVC:  Appears normal.  Pancreas:  The pancreas is mostly obscured by overlying bowel gas is not well visualized.  Spleen:  Spleen length measures 10.3 cm.  Normal homogeneous parenchymal echotexture.  Right Kidney:  Right kidney length measures 10.4 cm.  No hydronephrosis.  Left Kidney:  Left kidney measures 12.3 cm length.  No hydronephrosis.  Abdominal aorta:  No aneurysm identified.  IMPRESSION: Focal gallbladder sludge and gallbladder wall thickening without obvious shadowing stones.  Nonspecific etiology.  Original Report Authenticated By: Marlon Pel, M.D.     1. Abdominal pain, RUQ (right upper quadrant)       MDM  abnl-u/a         Barkley Bruns, MD 09/10/11 1416

## 2011-09-09 NOTE — ED Notes (Signed)
Pt remains in triage waiting area.  C/o increased R sided abd pain and groin pain.  Pt updated on wait time for treatment room.

## 2011-09-09 NOTE — ED Notes (Signed)
Pt reports having (R) sided groin and flank pain onset a couple of days ago.  LBM today-normal.  Urine is dark in color-almost orange.  Pt is tender on palpation of the (R) side.  No bruising or deformity noted.

## 2011-09-09 NOTE — ED Provider Notes (Signed)
History     CSN: [id number]  Arrival date & time 09/09/11  1655   First MD Initiated Contact with Patient 09/09/11 2142      Chief Complaint  Patient presents with  . Abdominal Pain  . Back Pain    (Consider location/radiation/quality/duration/timing/severity/associated sxs/prior treatment) HPI history from patient Patient is a 60 year old female with history of stage IV non-Hodgkin's lymphoma who presents with abdominal pain. This started about 2-3 weeks ago. It is located in her epigastric and right upper quadrant region. There is occasional radiation towards her back. She has associated nausea but no vomiting. Having normal bowel movements. The pain is made worse with eating. She was initially staying hydrated until last couple days the pain has prevented her from eating and drinking. The pain has been intermittent over the last 4 days it has been more persistent and worsening (especially today). Patient denies fever. Patient was initially evaluated for this problem a couple weeks ago at [facility].  At that time her workup revealed a CT abdomen with significant lymphadenopathy but no specific GI etiology. She underwent uncomplicated upper endoscopy 2 weeks ago which revealed a couple polyps but overall was unremarkable. Severity noted to be moderate to severe.  Past Medical History  Diagnosis Date  . Gastric polyps     History of  . Cancer     hx of Rituxan in 07/2011  . Non-Hodgkin's lymphoma   . GERD (gastroesophageal reflux disease)   . HTN (hypertension)   . DM type 2 (diabetes mellitus, type 2)   . Asthma   . Non Hodgkin's lymphoma     Past Surgical History  Procedure Date  . Portacath placement   . Upper gastrointestinal endoscopy 02/2011    gastric polyps x 2  . Neck and thigh surgery     lymph nodes removed  . Esophagogastroduodenoscopy 08/22/2011    Procedure: ESOPHAGOGASTRODUODENOSCOPY (EGD);  Surgeon: Louis Meckel, MD;  Location: [facility];  Service:  Endoscopy;  Laterality: N/A;    Family History  Problem Relation Age of Onset  . Hypertension Mother   . Hypertension Father   . Kidney disease Father     History  Substance Use Topics  . Smoking status: Never Smoker   . Smokeless tobacco: Never Used  . Alcohol Use: 0.0 oz/week     Occasional    OB History    Grav Para Term Preterm Abortions TAB SAB Ect Mult Living                  Review of Systems  Constitutional: Negative for fever and chills.  HENT: Negative for facial swelling.   Eyes: Negative for visual disturbance.  Respiratory: Negative for cough, chest tightness, shortness of breath and wheezing.   Cardiovascular: Negative for chest pain.  Gastrointestinal: Positive for nausea and abdominal pain. Negative for vomiting and diarrhea.  Genitourinary: Negative for dysuria and difficulty urinating.  Skin: Negative for rash.  Neurological: Negative for weakness and numbness.  Psychiatric/Behavioral: Negative for behavioral problems and confusion.  All other systems reviewed and are negative.    Allergies  Penicillins and Shellfish allergy  Home Medications   Current Outpatient Rx  Name Route Sig Dispense Refill  . ALPRAZOLAM 0.25 MG PO TABS Oral Take 0.25 mg by mouth 2 (two) times daily as needed. anxiety    . VITAMIN B 12 PO Oral Take 1 tablet by mouth daily.      Marland Kitchen METFORMIN HCL 500 MG PO TABS Oral Take  500 mg by mouth 2 (two) times daily with a meal.      . OMEPRAZOLE MAGNESIUM 20 MG PO TBEC Oral Take 1 tablet (20 mg total) by mouth 2 (two) times daily. 60 tablet 0  . OVER THE COUNTER MEDICATION Oral Take 3 tablets by mouth daily. Procera vitamin     . RAMIPRIL 10 MG PO TABS Oral Take 20 mg by mouth daily.      . VENLAFAXINE HCL ER 75 MG PO CP24 Oral Take 75 mg by mouth daily.        BP 114/64  Pulse 97  Temp(Src) 99.1 F (37.3 C) (Oral)  Resp 16  SpO2 94%  Physical Exam  Nursing note and vitals reviewed. Constitutional: She is oriented to  person, place, and time. She appears well-developed and well-nourished. No distress.  HENT:  Head: Normocephalic.  Nose: Nose normal.  Eyes: EOM are normal.  Neck: Normal range of motion. Neck supple.       Lymphadenopathy of cervical chain and right inguinal region.  Cardiovascular: Normal rate, regular rhythm and intact distal pulses.   No murmur heard. Pulmonary/Chest: Effort normal and breath sounds normal. No respiratory distress. She has no wheezes. She exhibits no tenderness.  Abdominal: Soft. She exhibits no distension.       Moderate tenderness palpation of her epigastrium Positive Murphy sign No rebound or guarding. No peritonitis  Musculoskeletal: Normal range of motion. She exhibits no edema and no tenderness.       No calf TTP  Neurological: She is alert and oriented to person, place, and time.       Normal strength  Skin: Skin is warm and dry. No rash noted. She is not diaphoretic.  Psychiatric: She has a normal mood and affect. Her behavior is normal. Thought content normal.    ED Course  Procedures (including critical care time)  Results for orders placed during the hospital encounter of 09/09/11  URINALYSIS, ROUTINE W REFLEX MICROSCOPIC      Component Value Range   Color, Urine AMBER (*) YELLOW    APPearance HAZY (*) CLEAR    Specific Gravity, Urine 1.016  1.005 - 1.030    pH 6.0  5.0 - 8.0    Glucose, UA NEGATIVE  NEGATIVE (mg/dL)   Hgb urine dipstick NEGATIVE  NEGATIVE    Bilirubin Urine LARGE (*) NEGATIVE    Ketones, ur 15 (*) NEGATIVE (mg/dL)   Protein, ur NEGATIVE  NEGATIVE (mg/dL)   Urobilinogen, UA 1.0  0.0 - 1.0 (mg/dL)   Nitrite NEGATIVE  NEGATIVE    Leukocytes, UA MODERATE (*) NEGATIVE   URINE MICROSCOPIC-ADD ON      Component Value Range   Squamous Epithelial / LPF FEW (*) RARE    WBC, UA 11-20  <3 (WBC/hpf)   Bacteria, UA FEW (*) RARE    Urine-Other MUCOUS PRESENT    CBC      Component Value Range   WBC 5.1  4.0 - 10.5 (K/uL)   RBC 4.27   3.87 - 5.11 (MIL/uL)   Hemoglobin 12.4  12.0 - 15.0 (g/dL)   HCT 16.1  09.6 - 04.5 (%)   MCV 88.3  78.0 - 100.0 (fL)   MCH 29.0  26.0 - 34.0 (pg)   MCHC 32.9  30.0 - 36.0 (g/dL)   RDW 40.9  81.1 - 91.4 (%)   Platelets 127 (*) 150 - 400 (K/uL)  DIFFERENTIAL      Component Value Range   Neutrophils Relative 79 (*)  43 - 77 (%)   Neutro Abs 4.0  1.7 - 7.7 (K/uL)   Lymphocytes Relative 10 (*) 12 - 46 (%)   Lymphs Abs 0.5 (*) 0.7 - 4.0 (K/uL)   Monocytes Relative 9  3 - 12 (%)   Monocytes Absolute 0.5  0.1 - 1.0 (K/uL)   Eosinophils Relative 2  0 - 5 (%)   Eosinophils Absolute 0.1  0.0 - 0.7 (K/uL)   Basophils Relative 0  0 - 1 (%)   Basophils Absolute 0.0  0.0 - 0.1 (K/uL)  COMPREHENSIVE METABOLIC PANEL      Component Value Range   Sodium 139  135 - 145 (mEq/L)   Potassium 3.7  3.5 - 5.1 (mEq/L)   Chloride 103  96 - 112 (mEq/L)   CO2 24  19 - 32 (mEq/L)   Glucose, Bld 82  70 - 99 (mg/dL)   BUN 16  6 - 23 (mg/dL)   Creatinine, Ser 1.61  0.50 - 1.10 (mg/dL)   Calcium 9.6  8.4 - 09.6 (mg/dL)   Total Protein 6.7  6.0 - 8.3 (g/dL)   Albumin 3.6  3.5 - 5.2 (g/dL)   AST 78 (*) 0 - 37 (U/L)   ALT 90 (*) 0 - 35 (U/L)   Alkaline Phosphatase 144 (*) 39 - 117 (U/L)   Total Bilirubin 5.8 (*) 0.3 - 1.2 (mg/dL)   GFR calc non Af Amer 89 (*) >90 (mL/min)   GFR calc Af Amer >90  >90 (mL/min)  LIPASE, BLOOD      Component Value Range   Lipase 17  11 - 59 (U/L)   US Abdomen Complete  09/10/2011  *RADIOLOGY REPORT*  Clinical Data:  Right upper quadrant pain and elevated liver function studies.  COMPLETE ABDOMINAL ULTRASOUND  Comparison:  CT 08/21/2011  Findings:  Gallbladder:  There is layering sludge in the gallbladder with mild wall thickening.  No stones are visualized. Murphy's sign is indeterminate due to patient sedation.  Common bile duct:  Normal caliber with measured diameter of about 4 mm.  Liver:  No focal lesion identified.  Within normal limits in parenchymal echogenicity.  IVC:   Appears normal.  Pancreas:  The pancreas is mostly obscured by overlying bowel gas is not well visualized.  Spleen:  Spleen length measures 10.3 cm.  Normal homogeneous parenchymal echotexture.  Right Kidney:  Right kidney length measures 10.4 cm.  No hydronephrosis.  Left Kidney:  Left kidney measures 12.3 cm length.  No hydronephrosis.  Abdominal aorta:  No aneurysm identified.  IMPRESSION: Focal gallbladder sludge and gallbladder wall thickening without obvious shadowing stones.  Nonspecific etiology.  Original Report Authenticated By: Marlon Pel, M.D.   Ct Abdomen Pelvis W Contrast  08/21/2011  *RADIOLOGY REPORT*  Clinical Data: Epigastric pain, nausea.  History of gastric lymphoma with prior chemotherapy.  CT ABDOMEN AND PELVIS WITH CONTRAST  Technique:  Multidetector CT imaging of the abdomen and pelvis was performed following the standard protocol during bolus administration of intravenous contrast.  Contrast: OMNIPAQUE IOHEXOL 300 MG/ML IV SOLN none.  Comparison: None.  Findings: Bibasilar atelectasis.  Heart is upper limits normal in size.  No effusions.  There are mildly enlarged right cardiophrenic lymph nodes.  Lymph node on image 7 measures 13 mm.  Lymph node on image 19 anterior to the liver measures 12 mm.  On the same image, a lymph node anterior to the right heart measures 9 mm.  These are short axis measurements.  Borderline and  mildly enlarged celiac axis and porta hepatis lymph nodes.  Portacaval lymph node on image 25 has a short axis diameter of 19 mm.  There is extensive retroperitoneal adenopathy.  Left periaortic lymph node on image 38 has a short axis diameter of 23 mm.  Aortocaval lymph node on the same image has a short axis diameter of 20 mm.  Adenopathy continues into the iliac chains bilaterally, right much greater than left.  Enlarged lymph node at the common iliac bifurcation on the right has a short axis diameter of 26 mm.  External iliac node on image 73 on the  right has a short axis diameter of 24 mm.  Left external iliac node on image 73 has a short axis diameter of 12 mm.  Bilateral inguinal adenopathy also noted, right greater than left.  Small amount of free fluid in the pelvis.  Uterus, adnexa urinary bladder are grossly unremarkable.  Liver, spleen, pancreas, adrenals and kidneys are normal.  Stomach is grossly unremarkable. There is mild haziness throughout the mesentery with borderline and mildly enlarged mesenteric lymph nodes.  Large and small bowel are grossly unremarkable.  Aorta is calcified, non-aneurysmal.  IMPRESSION: Extensive of abdominal adenopathy including retroperitoneal, mesenteric, porta hepatis and celiac axis.  This continues in the pelvis within the iliac chains and pelvic sidewall, right greater than left.  Also, inguinal adenopathy, right greater than left. There is a right cardiophrenic angle adenopathy as well. Findings findings most compatible with lymphoma.  Small amount of free fluid in the pelvis.  Dependent atelectasis in the lung bases.  Original Report Authenticated By: Cyndie Chime, M.D.        1. Abdominal pain   2. Transaminitis       MDM   Patient here with abdominal pain. She recently had workup which revealed a negative upper endoscopy and a negative CT scan. LFTs today are elevated, which is new compared to results from a couple weeks ago. Based on these lab changes and nature pain, will obtain abdominal ultrasound to evaluate gallbladder. Endoscopy was 2 weeks ago and patient had recovered well from that. Do not suspect post procedure complication. There is also a simple diagnostic procedure so I doubt this would cause transaminitis. Patient is without thoracic complaints and has normal oxygenation. Doubt cardiac or pulmonary etiology of pain.  Care to Dr. Dierdre Highman with [place] pending.       Milus Glazier 09/10/11 0037  I saw and evaluated the patient, reviewed the resident's note and I agree with the  findings and plan. I reviewed [place]. Discussed with general surgery, Dr. Lindie Spruce he recommends a hida scan. The test was ordered and for elevated bili and LFTs, medicine consultation for admit. Case discussed with Dr. Houston Siren and plan is medical mission. Patient will be n.p.o. for now.  Sunnie Nielsen, MD 09/10/11 [phone]

## 2011-09-09 NOTE — ED Notes (Addendum)
Pt reports that she was admitted at [facility] and an endoscopy was performed for c/o abd pain thi smonth.  Pt presented to [facility] today for same and they transferred her here.  Pt reports abd pain, back pain, and dark urine.

## 2011-09-09 NOTE — ED Notes (Signed)
Pt  Was  Seen   Recently [facility]  Was  Admitted  And  Had     Endoscopy  Done  -  She  Now  Reports  abd  Pain and   Dark urine  Pt  Appears  In no  Acute  Distress     He  Color  Is  Somewhat  Dusky  She  Reports       She  Did  Not  Have  An  Ulcer  According to pt

## 2011-09-10 ENCOUNTER — Encounter (HOSPITAL_COMMUNITY): Payer: Self-pay | Admitting: Internal Medicine

## 2011-09-10 ENCOUNTER — Emergency Department (HOSPITAL_COMMUNITY): Payer: BC Managed Care – PPO

## 2011-09-10 LAB — COMPREHENSIVE METABOLIC PANEL
AST: 78 U/L — ABNORMAL HIGH (ref 0–37)
Albumin: 3.2 g/dL — ABNORMAL LOW (ref 3.5–5.2)
Albumin: 3.6 g/dL (ref 3.5–5.2)
Alkaline Phosphatase: 138 U/L — ABNORMAL HIGH (ref 39–117)
Alkaline Phosphatase: 144 U/L — ABNORMAL HIGH (ref 39–117)
BUN: 19 mg/dL (ref 6–23)
CO2: 24 mEq/L (ref 19–32)
Chloride: 103 mEq/L (ref 96–112)
Chloride: 105 mEq/L (ref 96–112)
Creatinine, Ser: 0.74 mg/dL (ref 0.50–1.10)
GFR calc Af Amer: >90 mL/min (ref >90)
GFR calc non Af Amer: 89 mL/min — ABNORMAL LOW (ref >90)
Glucose, Bld: 149 mg/dL — ABNORMAL HIGH (ref 70–99)
Potassium: 3.7 mEq/L (ref 3.5–5.1)
Potassium: 4.6 mEq/L (ref 3.5–5.1)
Total Bilirubin: 3.3 mg/dL — ABNORMAL HIGH (ref 0.3–1.2)
Total Bilirubin: 5.8 mg/dL — ABNORMAL HIGH (ref 0.3–1.2)

## 2011-09-10 LAB — CREATININE, SERUM
Creatinine, Ser: 0.71 mg/dL (ref 0.50–1.10)
GFR calc Af Amer: >90 mL/min (ref >90)
GFR calc non Af Amer: >90 mL/min (ref >90)

## 2011-09-10 LAB — CBC
Platelets: 132 10*3/uL — ABNORMAL LOW (ref 150–400)
RDW: 13.5 % (ref 11.5–15.5)
WBC: 6.2 10*3/uL (ref 4.0–10.5)

## 2011-09-10 LAB — HEMOGLOBIN A1C: Mean Plasma Glucose: 120 mg/dL — ABNORMAL HIGH (ref <117)

## 2011-09-10 LAB — GLUCOSE, CAPILLARY: Glucose-Capillary: 124 mg/dL — ABNORMAL HIGH (ref 70–99)

## 2011-09-10 MED ORDER — RAMIPRIL 10 MG PO CAPS
20.0000 mg | ORAL_CAPSULE | Freq: Every day | ORAL | Status: DC
  Administered 2011-09-10 – 2011-09-14 (×5): 20 mg via ORAL
  Filled 2011-09-10 (×5): qty 2

## 2011-09-10 MED ORDER — INSULIN ASPART 100 UNIT/ML ~~LOC~~ SOLN
0.0000 [IU] | SUBCUTANEOUS | Status: DC
  Administered 2011-09-10 (×3): 1 [IU] via SUBCUTANEOUS
  Administered 2011-09-11: 2 [IU] via SUBCUTANEOUS
  Administered 2011-09-11: 1 [IU] via SUBCUTANEOUS
  Administered 2011-09-11 (×2): 2 [IU] via SUBCUTANEOUS
  Administered 2011-09-12 – 2011-09-13 (×4): 1 [IU] via SUBCUTANEOUS
  Filled 2011-09-10: qty 3

## 2011-09-10 MED ORDER — HYDROMORPHONE HCL PF 1 MG/ML IJ SOLN
1.0000 mg | INTRAMUSCULAR | Status: DC | PRN
  Administered 2011-09-10: 1 mg via INTRAVENOUS
  Filled 2011-09-10: qty 1

## 2011-09-10 MED ORDER — ALPRAZOLAM 0.25 MG PO TABS: 0.2500 mg | ORAL_TABLET | Freq: Two times a day (BID) | ORAL | Status: DC | PRN

## 2011-09-10 MED ORDER — VENLAFAXINE HCL ER 75 MG PO CP24
75.0000 mg | ORAL_CAPSULE | Freq: Every day | ORAL | Status: DC
  Administered 2011-09-10 – 2011-09-14 (×5): 75 mg via ORAL
  Filled 2011-09-10 (×5): qty 1

## 2011-09-10 MED ORDER — METFORMIN HCL 500 MG PO TABS
500.0000 mg | ORAL_TABLET | Freq: Two times a day (BID) | ORAL | Status: DC
  Filled 2011-09-10 (×3): qty 1

## 2011-09-10 MED ORDER — ENOXAPARIN SODIUM 40 MG/0.4ML ~~LOC~~ SOLN
40.0000 mg | SUBCUTANEOUS | Status: DC
  Administered 2011-09-10 – 2011-09-14 (×5): 40 mg via SUBCUTANEOUS
  Filled 2011-09-10 (×6): qty 0.4

## 2011-09-10 MED ORDER — MORPHINE SULFATE 4 MG/ML IJ SOLN
4.0000 mg | Freq: Once | INTRAMUSCULAR | Status: AC
  Administered 2011-09-10: 4 mg via INTRAVENOUS
  Filled 2011-09-10: qty 1

## 2011-09-10 MED ORDER — KCL IN DEXTROSE-NACL 20-5-0.9 MEQ/L-%-% IV SOLN
INTRAVENOUS | Status: DC
  Administered 2011-09-10 – 2011-09-12 (×6): via INTRAVENOUS
  Filled 2011-09-10 (×8): qty 1000

## 2011-09-10 MED ORDER — MORPHINE SULFATE 2 MG/ML IJ SOLN
1.0000 mg | INTRAMUSCULAR | Status: DC | PRN
  Administered 2011-09-10 – 2011-09-13 (×7): 1 mg via INTRAVENOUS
  Filled 2011-09-10 (×7): qty 1

## 2011-09-10 MED ORDER — OMEPRAZOLE MAGNESIUM 20 MG PO TBEC: 20.0000 mg | DELAYED_RELEASE_TABLET | Freq: Two times a day (BID) | ORAL | Status: DC

## 2011-09-10 MED ORDER — MORPHINE SULFATE 4 MG/ML IJ SOLN: 4.0000 mg | Freq: Once | INTRAMUSCULAR | Status: DC

## 2011-09-10 MED ORDER — ONDANSETRON HCL 4 MG/2ML IJ SOLN
4.0000 mg | Freq: Three times a day (TID) | INTRAMUSCULAR | Status: DC
  Administered 2011-09-10 – 2011-09-14 (×13): 4 mg via INTRAVENOUS
  Filled 2011-09-10 (×13): qty 2

## 2011-09-10 MED ORDER — ONDANSETRON HCL 4 MG/2ML IJ SOLN: 4.0000 mg | Freq: Once | INTRAMUSCULAR | Status: DC

## 2011-09-10 NOTE — Progress Notes (Signed)
Ask pt several times too let me help with bath .Marland Kitchen... Refused

## 2011-09-10 NOTE — H&P (Signed)
PCP: None, visiting from [place]  Chief Complaint: Abdominal pain   HPI: Victoria Wood is an 61 y.o. female from [place], visiting her family here, with history of lymphoma, status post Rituxan chemotherapy, diabetes on Glucophage, hypertension, recently admitted to [facility] for abdominal pain, return to [facility] emergency room tonight with persistent nausea, right quadrant pain, and notable jaundice. When she was admitted last time, she has an EGD which only showed benign-appearing gastric polyp, and was treated with pain medication along with continued PPI for GERD. An abdominal pelvic CT at that time showed lymphadenopathy consistent with lymphoma but otherwise unremarkable. And she returned today, an ultrasound showed some sludge in the gallbladder but no discrete stone, and no dilated common bile duct. Serology shows elevation of her liver function tests, and alkaline phosphatase of 140, total bili of 5.7. She has no elevated white count and no fever or chills. Her urinalysis shows evidence of infection. She also has slight thrombocytopenia with platelet count of 129,000. Dr. Dierdre Highman consulted Dr. Lindie Spruce, who recommended a HIDA scan and GI consult. Hospitalist was asked to admit patient.  Rewiew of Systems:  The patient denies anorexia, fever, weight loss,, vision loss, decreased hearing, hoarseness, chest pain, syncope, dyspnea on exertion, peripheral edema, balance deficits, hemoptysis, melena, hematochezia, severe indigestion/heartburn, hematuria, incontinence, genital sores, muscle weakness, suspicious skin lesions, transient blindness, difficulty walking, depression, unusual weight change, abnormal bleeding, angioedema, and breast masses.    Past Medical History  Diagnosis Date  . Gastric polyps     History of  . Cancer     hx of Rituxan in 07/2011  . Non-Hodgkin's lymphoma   . GERD (gastroesophageal reflux disease)   . HTN (hypertension)   . DM type 2 (diabetes mellitus, type 2)   .  Asthma   . Non Hodgkin's lymphoma     Past Surgical History  Procedure Date  . Portacath placement   . Upper gastrointestinal endoscopy 02/2011    gastric polyps x 2  . Neck and thigh surgery     lymph nodes removed  . Esophagogastroduodenoscopy 08/22/2011    Procedure: ESOPHAGOGASTRODUODENOSCOPY (EGD);  Surgeon: Louis Meckel, MD;  Location: [facility];  Service: Endoscopy;  Laterality: N/A;    Medications:  HOME MEDS: Prior to Admission medications   Medication Sig Start Date End Date Taking? Authorizing Provider  ALPRAZolam (XANAX) 0.25 MG tablet Take 0.25 mg by mouth 2 (two) times daily as needed. anxiety   Yes Historical Provider, MD  Cyanocobalamin (VITAMIN B 12 PO) Take 1 tablet by mouth daily.     Yes Historical Provider, MD  metFORMIN (GLUCOPHAGE) 500 MG tablet Take 500 mg by mouth 2 (two) times daily with a meal.     Yes Historical Provider, MD  omeprazole (PRILOSEC OTC) 20 MG tablet Take 1 tablet (20 mg total) by mouth 2 (two) times daily. 08/22/11 09/22/11 Yes Srikar A Reddy  OVER THE COUNTER MEDICATION Take 3 tablets by mouth daily. Procera vitamin    Yes Historical Provider, MD  ramipril (ALTACE) 10 MG tablet Take 20 mg by mouth daily.     Yes Historical Provider, MD  venlafaxine (EFFEXOR-XR) 75 MG 24 hr capsule Take 75 mg by mouth daily.     Yes Historical Provider, MD     Allergies:  Allergies  Allergen Reactions  . Penicillins     unknown  . Shellfish Allergy     migraines    Social History:   reports that she has never smoked. She  has never used smokeless tobacco. She reports that she drinks alcohol. She reports that she does not use illicit drugs.  Family History: Family History  Problem Relation Age of Onset  . Hypertension Mother   . Hypertension Father   . Kidney disease Father      Physical Exam: Filed Vitals:   09/09/11 2025 09/09/11 2348 09/10/11 0259 09/10/11 0521  BP: 125/73 114/64 103/61 104/60  Pulse: 97 97 96 91  Temp: 98.6 F  (37 C) 99.1 F (37.3 C) 98.9 F (37.2 C)   TempSrc: Oral Oral Oral   Resp: 18 16 18 18   SpO2: 99% 94% 98% 95%   Blood pressure 104/60, pulse 91, temperature 98.9 F (37.2 C), temperature source Oral, resp. rate 18, SpO2 95.00%.  GEN:  Pleasant  person lying in the stretcher in no acute distress; cooperative with exam PSYCH:  alert and oriented x4; does not appear anxious does not appear depressed; affect is normal HEENT: Mucous membranes pink and icteric; PERRLA; EOM intact; she has shotty nodes, no carotid bruit; no JVD; Breasts:: Not examined CHEST WALL: No tenderness CHEST: Normal respiration, clear to auscultation bilaterally HEART: Regular rate and rhythm; no murmurs rubs or gallops BACK: No kyphosis or scoliosis; no CVA tenderness ABDOMEN: Obese, soft non-tender; no masses, no organomegaly, normal abdominal bowel sounds; no pannus; no intertriginous candida. Rectal Exam: Not done EXTREMITIES: No bone or joint deformity; age-appropriate arthropathy of the hands and knees; no edema; no ulcerations. Genitalia: not examined PULSES: 2+ and symmetric SKIN: Normal hydration no rash or ulceration CNS: Cranial nerves 2-12 grossly intact no focal neurologic deficit   Labs & Imaging Results for orders placed during the hospital encounter of 09/09/11 (from the past 48 hour(s))  URINALYSIS, ROUTINE W REFLEX MICROSCOPIC     Status: Abnormal   Collection Time   09/09/11  8:26 PM      Component Value Range Comment   Color, Urine AMBER (*) YELLOW  BIOCHEMICALS MAY BE AFFECTED BY COLOR   APPearance HAZY (*) CLEAR     Specific Gravity, Urine 1.016  1.005 - 1.030     pH 6.0  5.0 - 8.0     Glucose, UA NEGATIVE  NEGATIVE (mg/dL)    Hgb urine dipstick NEGATIVE  NEGATIVE     Bilirubin Urine LARGE (*) NEGATIVE     Ketones, ur 15 (*) NEGATIVE (mg/dL)    Protein, ur NEGATIVE  NEGATIVE (mg/dL)    Urobilinogen, UA 1.0  0.0 - 1.0 (mg/dL)    Nitrite NEGATIVE  NEGATIVE     Leukocytes, UA MODERATE  (*) NEGATIVE    URINE MICROSCOPIC-ADD ON     Status: Abnormal   Collection Time   09/09/11  8:26 PM      Component Value Range Comment   Squamous Epithelial / LPF FEW (*) RARE     WBC, UA 11-20  <3 (WBC/hpf)    Bacteria, UA FEW (*) RARE     Urine-Other MUCOUS PRESENT     CBC     Status: Abnormal   Collection Time   09/09/11 11:32 PM      Component Value Range Comment   WBC 5.1  4.0 - 10.5 (K/uL)    RBC 4.27  3.87 - 5.11 (MIL/uL)    Hemoglobin 12.4  12.0 - 15.0 (g/dL)    HCT 16.1  09.6 - 04.5 (%)    MCV 88.3  78.0 - 100.0 (fL)    MCH 29.0  26.0 - 34.0 (pg)  MCHC 32.9  30.0 - 36.0 (g/dL)    RDW 16.1  09.6 - 04.5 (%)    Platelets 127 (*) 150 - 400 (K/uL)   DIFFERENTIAL     Status: Abnormal   Collection Time   09/09/11 11:32 PM      Component Value Range Comment   Neutrophils Relative 79 (*) 43 - 77 (%)    Neutro Abs 4.0  1.7 - 7.7 (K/uL)    Lymphocytes Relative 10 (*) 12 - 46 (%)    Lymphs Abs 0.5 (*) 0.7 - 4.0 (K/uL)    Monocytes Relative 9  3 - 12 (%)    Monocytes Absolute 0.5  0.1 - 1.0 (K/uL)    Eosinophils Relative 2  0 - 5 (%)    Eosinophils Absolute 0.1  0.0 - 0.7 (K/uL)    Basophils Relative 0  0 - 1 (%)    Basophils Absolute 0.0  0.0 - 0.1 (K/uL)   COMPREHENSIVE METABOLIC PANEL     Status: Abnormal   Collection Time   09/09/11 11:32 PM      Component Value Range Comment   Sodium 139  135 - 145 (mEq/L)    Potassium 3.7  3.5 - 5.1 (mEq/L)    Chloride 103  96 - 112 (mEq/L)    CO2 24  19 - 32 (mEq/L)    Glucose, Bld 82  70 - 99 (mg/dL)    BUN 16  6 - 23 (mg/dL)    Creatinine, Ser 4.09  0.50 - 1.10 (mg/dL)    Calcium 9.6  8.4 - 10.5 (mg/dL)    Total Protein 6.7  6.0 - 8.3 (g/dL)    Albumin 3.6  3.5 - 5.2 (g/dL)    AST 78 (*) 0 - 37 (U/L)    ALT 90 (*) 0 - 35 (U/L)    Alkaline Phosphatase 144 (*) 39 - 117 (U/L)    Total Bilirubin 5.8 (*) 0.3 - 1.2 (mg/dL)    GFR calc non Af Amer 89 (*) >90 (mL/min)    GFR calc Af Amer >90  >90 (mL/min)   LIPASE, BLOOD      Status: Normal   Collection Time   09/09/11 11:32 PM      Component Value Range Comment   Lipase 17  11 - 59 (U/L)    US Abdomen Complete  09/10/2011  *RADIOLOGY REPORT*  Clinical Data:  Right upper quadrant pain and elevated liver function studies.  COMPLETE ABDOMINAL ULTRASOUND  Comparison:  CT 08/21/2011  Findings:  Gallbladder:  There is layering sludge in the gallbladder with mild wall thickening.  No stones are visualized. Murphy's sign is indeterminate due to patient sedation.  Common bile duct:  Normal caliber with measured diameter of about 4 mm.  Liver:  No focal lesion identified.  Within normal limits in parenchymal echogenicity.  IVC:  Appears normal.  Pancreas:  The pancreas is mostly obscured by overlying bowel gas is not well visualized.  Spleen:  Spleen length measures 10.3 cm.  Normal homogeneous parenchymal echotexture.  Right Kidney:  Right kidney length measures 10.4 cm.  No hydronephrosis.  Left Kidney:  Left kidney measures 12.3 cm length.  No hydronephrosis.  Abdominal aorta:  No aneurysm identified.  IMPRESSION: Focal gallbladder sludge and gallbladder wall thickening without obvious shadowing stones.  Nonspecific etiology.  Original Report Authenticated By: Marlon Pel, M.D.      Assessment Present on Admission:  .Epigastric pain .Non-Hodgkin's lymphoma .HTN (hypertension) UTI Borderline thrombocytopenia  PLAN:  Will admit her to general medical floor and placed on n.p.o. we'll give her intravenous fluid with dextrose along with sensitive insulin sliding scale. I will get an the scan with ejection fraction. Is possible that she has a small stone causing obstructive jaundice. Please consult GI in the morning. We'll continue her current medication for hypertension. Her lymphoma is stable, her thrombocytopenia is mild and should be followed closely. She is stable, full code, and will be admitted to [facility] hospitalist service.  Other plans as per  orders.   Dimetrius Montfort 09/10/2011, 5:57 AM

## 2011-09-10 NOTE — Progress Notes (Signed)
1:55 PM I agree with HPI/GPe and A/P per Dr. Conley Rolls      States that the abdominal pain is worse than prior, more often.  Now throwing up bile as well, has dark urine and itching as well.  Pain medicine makes the pain 0/10.  Had pain since discharge as well-the orange urine and bile scared her.  Not able to tolerate anything PIO   Has had intermittent pain as well  Pet Scan in November not available Not for further chemo as per Oncologist in [place]    BP 109/73  Pulse 96  Temp(Src) 97.9 F (36.6 C) (Oral)  Resp 18  Ht 5\' 6"  (1.676 m)  Wt 66.8 kg (147 lb 4.3 oz)  BMI 23.77 kg/m2  SpO2 100% General appearance: alert, cooperative and appears older than stated age Eyes: conjunctivae/corneas clear. PERRL, EOM's intact. Fundi benign. Throat: lips, mucosa, and tongue normal; teeth and gums normal Lungs: clear to auscultation bilaterally and normal percussion bilaterally Heart: regular rate and rhythm, S1, S2 normal, no murmur, click, rub or gallop Abdomen: tender in epigastrium. no rebound, some gaurd.  Extremities: extremities normal, atraumatic, no cyanosis or edema Pulses: 2+ and symmetric   Patient Active Problem List  Diagnoses  . Epigastric pain-likely Gall bladder related CT review 12/14=NOdes all over abdomen, with 2 cm node in [place] area-unlikely cause. Family unsure if they want her Oncological isseus dealt with here-explained if HIDA scan negative, next possible cause could be very well the adenopathy causing some of these issues Will rpt LFT's Clear liquid Keep NPO and follow-Hida Will need to call Gen Surgery once HIDA results back--not able to be done due to lack of staffing  . Non-Hodgkin's lymphoma-see above Consult ONC if needed and no HIDA Explanation--this is the thrid time she has had these issues  . GERD (gastroesophageal reflux disease)-PPI  . HTN (hypertension)-Continue Ramipril 20.  Elevated 2/2 to pain  . DM type 2 (diabetes mellitus, type 2)-low  blood sugars.  Will reassess      I will updated family as to the course of their family members care-family understands will need further assesment   Pleas Koch, MD [facility] [phone]

## 2011-09-10 NOTE — ED Notes (Signed)
Pt to US.

## 2011-09-11 ENCOUNTER — Inpatient Hospital Stay (HOSPITAL_COMMUNITY): Payer: BC Managed Care – PPO

## 2011-09-11 ENCOUNTER — Observation Stay (HOSPITAL_COMMUNITY): Payer: BC Managed Care – PPO

## 2011-09-11 LAB — CBC
MCV: 89.5 fL (ref 78.0–100.0)
Platelets: 132 10*3/uL — ABNORMAL LOW (ref 150–400)
RDW: 13.3 % (ref 11.5–15.5)
WBC: 3.7 10*3/uL — ABNORMAL LOW (ref 4.0–10.5)

## 2011-09-11 LAB — GLUCOSE, CAPILLARY
Glucose-Capillary: 132 mg/dL — ABNORMAL HIGH (ref 70–99)
Glucose-Capillary: 139 mg/dL — ABNORMAL HIGH (ref 70–99)
Glucose-Capillary: 143 mg/dL — ABNORMAL HIGH (ref 70–99)
Glucose-Capillary: 157 mg/dL — ABNORMAL HIGH (ref 70–99)

## 2011-09-11 LAB — COMPREHENSIVE METABOLIC PANEL
AST: 46 U/L — ABNORMAL HIGH (ref 0–37)
Albumin: 2.9 g/dL — ABNORMAL LOW (ref 3.5–5.2)
Chloride: 105 mEq/L (ref 96–112)
Creatinine, Ser: 0.72 mg/dL (ref 0.50–1.10)
Total Bilirubin: 2.5 mg/dL — ABNORMAL HIGH (ref 0.3–1.2)
Total Protein: 5.9 g/dL — ABNORMAL LOW (ref 6.0–8.3)

## 2011-09-11 MED ORDER — PANTOPRAZOLE SODIUM 40 MG PO TBEC
40.0000 mg | DELAYED_RELEASE_TABLET | Freq: Every day | ORAL | Status: DC
  Administered 2011-09-12 – 2011-09-14 (×3): 40 mg via ORAL
  Filled 2011-09-11 (×4): qty 1

## 2011-09-11 MED ORDER — TECHNETIUM TC 99M MEBROFENIN IV KIT
5.0000 | PACK | Freq: Once | INTRAVENOUS | Status: AC | PRN
  Administered 2011-09-11: 5 via INTRAVENOUS

## 2011-09-11 MED ORDER — DIPHENHYDRAMINE HCL 25 MG PO CAPS
25.0000 mg | ORAL_CAPSULE | Freq: Once | ORAL | Status: AC
  Administered 2011-09-11: 25 mg via ORAL
  Filled 2011-09-11: qty 1

## 2011-09-11 NOTE — Progress Notes (Signed)
09/11/2011 Caedyn Tassinari [facility] Case Management Note [phone]  Utilization review completed.  

## 2011-09-11 NOTE — Progress Notes (Addendum)
PATIENT DETAILS Name: Victoria Wood Age: 61 y.o. Sex: female Date of Birth: [birth year] Admit Date: 09/09/2011 PCP:No primary provider on file.  Subjective: Abdominal pain slightly better. Wants to try liquids today. Per family at bedside-yellowish discoloration of the skin better as well.  Objective: Vital signs in last 24 hours: Filed Vitals:   09/11/11 0600 09/11/11 0945 09/11/11 1511 09/11/11 1746  BP: 114/85 116/68 132/78 112/80  Pulse: 84 87 80 82  Temp: 98.5 F (36.9 C) 98.6 F (37 C) 97.4 F (36.3 C) 97.8 F (36.6 C)  TempSrc: Oral Oral Oral Oral  Resp: 16 18 20 20   Height:      Weight:      SpO2: 93% 95% 96% 96%    Weight change: 1.149 kg (2 lb 8.5 oz)  Body mass index is 24.18 kg/(m^2).  Intake/Output from previous day:  Intake/Output Summary (Last 24 hours) at 09/11/11 1811 Last data filed at 09/11/11 1748  Gross per 24 hour  Intake    240 ml  Output    300 ml  Net    -60 ml    PHYSICAL EXAM: Gen Exam: Awake and alert with clear speech.   Neck: Supple, No JVD.   Chest: B/L Clear.   CVS: S1 S2 Regular, no murmurs.  Abdomen: soft, BS +, very minimally tender in the epigastric area with no rebound or rigidity, non distended.  Extremities: no edema, lower extremities warm to touch. Neurologic: Non Focal.   Skin: No Rash.   Wounds: N/A.    CONSULTS:  none  LAB RESULTS: CBC  Lab 09/11/11 0710 09/10/11 0933 09/09/11 2332  WBC 3.7* 6.2 5.1  HGB 11.3* 12.4 12.4  HCT 34.2* 37.5 37.7  PLT 132* 132* 127*  MCV 89.5 88.4 88.3  MCH 29.6 29.2 29.0  MCHC 33.0 33.1 32.9  RDW 13.3 13.5 13.4  LYMPHSABS -- -- 0.5*  MONOABS -- -- 0.5  EOSABS -- -- 0.1  BASOSABS -- -- 0.0  BANDABS -- -- --    Chemistries   Lab 09/11/11 0710 09/10/11 1445 09/10/11 0933 09/09/11 2332  NA 137 141 -- 139  K 4.8 4.6 -- 3.7  CL 105 105 -- 103  CO2 25 28 -- 24  GLUCOSE 149* 149* -- 82  BUN 15 19 -- 16  CREATININE 0.72 0.73 0.71 0.74  CALCIUM 8.9 9.6 -- 9.6  MG -- --  -- --    GFR Estimated Creatinine Clearance: 68.3 ml/min (by C-G formula based on Cr of 0.72).  Coagulation profile No results found for this basename: INR:5,PROTIME:5 in the last 168 hours  Cardiac Enzymes No results found for this basename: CK:3,CKMB:3,TROPONINI:3,MYOGLOBIN:3 in the last 168 hours  No components found with this basename: POCBNP:3 No results found for this basename: DDIMER:2 in the last 72 hours  Basename 09/10/11 0933  HGBA1C 5.8*   No results found for this basename: CHOL:2,HDL:2,LDLCALC:2,TRIG:2,CHOLHDL:2,LDLDIRECT:2 in the last 72 hours No results found for this basename: TSH,T4TOTAL,FREET3,T3FREE,THYROIDAB in the last 72 hours No results found for this basename: VITAMINB12:2,FOLATE:2,FERRITIN:2,TIBC:2,IRON:2,RETICCTPCT:2 in the last 72 hours  Basename 09/09/11 2332  LIPASE 17  AMYLASE --    Urine Studies No results found for this basename: UACOL:2,UAPR:2,USPG:2,UPH:2,UTP:2,UGL:2,UKET:2,UBIL:2,UHGB:2,UNIT:2,UROB:2,ULEU:2,UEPI:2,UWBC:2,URBC:2,UBAC:2,CAST:2,CRYS:2,UCOM:2,BILUA:2 in the last 72 hours  MICROBIOLOGY: No results found for this or any previous visit (from the past 240 hour(s)).  RADIOLOGY STUDIES/RESULTS: Nm Hepatobiliary Liver Func  09/11/2011  *RADIOLOGY REPORT*  Clinical Data:  Quadrant pain  NUCLEAR MEDICINE HEPATOBILIARY IMAGING  Technique:  Sequential images of the abdomen were  obtained out to 60 minutes following intravenous administration of radiopharmaceutical.  Radiopharmaceutical:  5.0 mCi Tc-50m Choletec  Comparison:  None.  Findings: Gallbladder activity occurs after 1 hour.  Small bowel activity occurs after 30 minutes.  IMPRESSION: Cystic and common bile ducts are patent.  Original Report Authenticated By: Donavan Burnet, M.D.   US Abdomen Complete  09/10/2011  *RADIOLOGY REPORT*  Clinical Data:  Right upper quadrant pain and elevated liver function studies.  COMPLETE ABDOMINAL ULTRASOUND  Comparison:  CT 08/21/2011  Findings:   Gallbladder:  There is layering sludge in the gallbladder with mild wall thickening.  No stones are visualized. Murphy's sign is indeterminate due to patient sedation.  Common bile duct:  Normal caliber with measured diameter of about 4 mm.  Liver:  No focal lesion identified.  Within normal limits in parenchymal echogenicity.  IVC:  Appears normal.  Pancreas:  The pancreas is mostly obscured by overlying bowel gas is not well visualized.  Spleen:  Spleen length measures 10.3 cm.  Normal homogeneous parenchymal echotexture.  Right Kidney:  Right kidney length measures 10.4 cm.  No hydronephrosis.  Left Kidney:  Left kidney measures 12.3 cm length.  No hydronephrosis.  Abdominal aorta:  No aneurysm identified.  IMPRESSION: Focal gallbladder sludge and gallbladder wall thickening without obvious shadowing stones.  Nonspecific etiology.  Original Report Authenticated By: Marlon Pel, M.D.   Ct Abdomen Pelvis W Contrast  08/21/2011  *RADIOLOGY REPORT*  Clinical Data: Epigastric pain, nausea.  History of gastric lymphoma with prior chemotherapy.  CT ABDOMEN AND PELVIS WITH CONTRAST  Technique:  Multidetector CT imaging of the abdomen and pelvis was performed following the standard protocol during bolus administration of intravenous contrast.  Contrast: OMNIPAQUE IOHEXOL 300 MG/ML IV SOLN none.  Comparison: None.  Findings: Bibasilar atelectasis.  Heart is upper limits normal in size.  No effusions.  There are mildly enlarged right cardiophrenic lymph nodes.  Lymph node on image 7 measures 13 mm.  Lymph node on image 19 anterior to the liver measures 12 mm.  On the same image, a lymph node anterior to the right heart measures 9 mm.  These are short axis measurements.  Borderline and mildly enlarged celiac axis and porta hepatis lymph nodes.  Portacaval lymph node on image 25 has a short axis diameter of 19 mm.  There is extensive retroperitoneal adenopathy.  Left periaortic lymph node on image 38 has a  short axis diameter of 23 mm.  Aortocaval lymph node on the same image has a short axis diameter of 20 mm.  Adenopathy continues into the iliac chains bilaterally, right much greater than left.  Enlarged lymph node at the common iliac bifurcation on the right has a short axis diameter of 26 mm.  External iliac node on image 73 on the right has a short axis diameter of 24 mm.  Left external iliac node on image 73 has a short axis diameter of 12 mm.  Bilateral inguinal adenopathy also noted, right greater than left.  Small amount of free fluid in the pelvis.  Uterus, adnexa urinary bladder are grossly unremarkable.  Liver, spleen, pancreas, adrenals and kidneys are normal.  Stomach is grossly unremarkable. There is mild haziness throughout the mesentery with borderline and mildly enlarged mesenteric lymph nodes.  Large and small bowel are grossly unremarkable.  Aorta is calcified, non-aneurysmal.  IMPRESSION: Extensive of abdominal adenopathy including retroperitoneal, mesenteric, porta hepatis and celiac axis.  This continues in the pelvis within the iliac chains  and pelvic sidewall, right greater than left.  Also, inguinal adenopathy, right greater than left. There is a right cardiophrenic angle adenopathy as well. Findings findings most compatible with lymphoma.  Small amount of free fluid in the pelvis.  Dependent atelectasis in the lung bases.  Original Report Authenticated By: Cyndie Chime, M.D.   US Venous Img Lower Bilateral  08/19/2011  *RADIOLOGY REPORT*  Clinical Data: Right lower extremity edema.  VENOUS DUPLEX ULTRASOUND OF BILATERAL LOWER EXTREMITIES  Technique:  Gray-scale sonography with graded compression, as well as color Doppler and duplex ultrasound, were performed to evaluate the deep venous system of both lower extremities from the level of the common femoral vein through the popliteal and proximal calf veins.  Spectral Doppler was utilized to evaluate flow at rest and with distal  augmentation maneuvers.  Comparison:  None.  Findings: From the level of the common femoral vein to the popliteal vein, there is adequate color flow, compression and augmentation without evidence of a lower extremity deep venous thrombosis on either side.  Superficial veins are compressible as are visualized calf veins.  Reflux is noted bilaterally.  Bilateral inguinal adenopathy.  IMPRESSION: No evidence of lower extremity deep venous thrombosis on either side.  Bilateral inguinal adenopathy.  Etiology/significance indeterminate.  Original Report Authenticated By: Fuller Canada, M.D.    MEDICATIONS: Scheduled Meds:   . enoxaparin  40 mg Subcutaneous Q24H  . insulin aspart  0-9 Units Subcutaneous Q4H  . ondansetron (ZOFRAN) IV  4 mg Intravenous Q8H  . ramipril  20 mg Oral Daily  . venlafaxine  75 mg Oral Daily   Continuous Infusions:   . dextrose 5 % and 0.9 % NaCl with KCl 20 mEq/L 100 mL/hr at 09/11/11 1514   PRN Meds:.ALPRAZolam, morphine injection, technetium TC 35M mebrofenin  Antibiotics: Anti-infectives    None      Assessment/Plan: Patient Active Hospital Problem List: Epigastric pain    Assessment: Unclear etiology, possibly may be related to underlying malignancy.    Plan: Continue with PPI .HIDA is scan negative. Recent endoscopy showed a gastric polyp. Will check a abdominal plain x-ray to make sure patient not constipated. Start clear liquids and slowly advanced as tolerated.  Jaundice   Assessment: Not sure what the exact etiology here is. I do not have a defect bilirubin level, however alkaline phosphatase and transaminases are not very significantly elevated. HIDA scan shows a patent cystic and a common bile duct, so doubt obstruction. Could possibly be a viral etiology however would expect transaminases to be significantly higher. No significant drop in hemoglobin to suggest a hemolytic process(with lymphoma this is a possibility post)   Plan: We'll get a LFT  tomorrow. If bilirubin stays persistently high then we'll order a Coombs test. A bilirubin stays high, will order hepatitis panel as well.   Thrombocytopenia   Assessment: Mild,thrombocytopenia, but stable.   Plan: Monitor CBC.  Non-Hodgkin's lymphoma   Assessment: Status post chemotherapy November 2012 in [place].    Plan: Followup with her primary oncologist in [place].   HTN (hypertension)   Assessment: Controlled.    Plan: Continue ramipril.  Disposition: Remain inpatient for now.  DVT Prophylaxis: Lovenox.  Code Status: Full code  Maretta Bees,  MD. 09/11/2011, 6:11 PM

## 2011-09-12 DIAGNOSIS — K7589 Other specified inflammatory liver diseases: Secondary | ICD-10-CM

## 2011-09-12 DIAGNOSIS — C8589 Other specified types of non-Hodgkin lymphoma, extranodal and solid organ sites: Secondary | ICD-10-CM

## 2011-09-12 DIAGNOSIS — R109 Unspecified abdominal pain: Secondary | ICD-10-CM

## 2011-09-12 DIAGNOSIS — R1013 Epigastric pain: Principal | ICD-10-CM

## 2011-09-12 LAB — GLUCOSE, CAPILLARY
Glucose-Capillary: 108 mg/dL — ABNORMAL HIGH (ref 70–99)
Glucose-Capillary: 120 mg/dL — ABNORMAL HIGH (ref 70–99)
Glucose-Capillary: 122 mg/dL — ABNORMAL HIGH (ref 70–99)
Glucose-Capillary: 126 mg/dL — ABNORMAL HIGH (ref 70–99)
Glucose-Capillary: 96 mg/dL (ref 70–99)

## 2011-09-12 LAB — CBC
Hemoglobin: 11.2 g/dL — ABNORMAL LOW (ref 12.0–15.0)
MCH: 29.3 pg (ref 26.0–34.0)
MCHC: 32.9 g/dL (ref 30.0–36.0)
MCV: 89 fL (ref 78.0–100.0)

## 2011-09-12 LAB — BASIC METABOLIC PANEL
CO2: 28 mEq/L (ref 19–32)
Calcium: 9.2 mg/dL (ref 8.4–10.5)
Creatinine, Ser: 0.74 mg/dL (ref 0.50–1.10)
GFR calc Af Amer: >90 mL/min (ref >90)
GFR calc non Af Amer: 89 mL/min — ABNORMAL LOW (ref >90)
Sodium: 143 mEq/L (ref 135–145)

## 2011-09-12 LAB — LACTATE DEHYDROGENASE: LDH: 143 U/L (ref 94–250)

## 2011-09-12 LAB — HEPATIC FUNCTION PANEL
Alkaline Phosphatase: 126 U/L — ABNORMAL HIGH (ref 39–117)
Bilirubin, Direct: 0.9 mg/dL — ABNORMAL HIGH (ref 0.0–0.3)
Indirect Bilirubin: 0.9 mg/dL (ref 0.3–0.9)
Total Protein: 5.7 g/dL — ABNORMAL LOW (ref 6.0–8.3)

## 2011-09-12 NOTE — Progress Notes (Signed)
PATIENT DETAILS Name: Victoria Wood Age: 61 y.o. Sex: female Date of Birth: 09/21/1950 Admit Date: 09/09/2011 PCP:No primary provider on file.  Subjective: Having intermittent Abdominal pain slightly better.   Objective: Vital signs in last 24 hours: Filed Vitals:   09/11/11 1746 09/11/11 2050 09/12/11 0450 09/12/11 0945  BP: 112/80 132/65 134/69 121/79  Pulse: 82 78 100 89  Temp: 97.8 F (36.6 C) 97.9 F (36.6 C) 97.8 F (36.6 C) 97.8 F (36.6 C)  TempSrc: Oral Oral Oral Oral  Resp: 20 19 20 20   Height:  5\' 6"  (1.676 m)    Weight:  68.2 kg (150 lb 5.7 oz)    SpO2: 96% 97% 99% 98%    Weight change: 0.251 kg (8.9 oz)  Body mass index is 24.27 kg/(m^2).  Intake/Output from previous day:  Intake/Output Summary (Last 24 hours) at 09/12/11 1159 Last data filed at 09/12/11 0830  Gross per 24 hour  Intake    480 ml  Output   1050 ml  Net   -570 ml    PHYSICAL EXAM: Gen Exam: Awake and alert with clear speech.   Neck: Supple, No JVD.   Chest: B/L Clear. No added sounds  CVS: S1 S2 Regular, no murmurs.  Abdomen: soft, BS +, Non tender today Extremities: no edema, lower extremities warm to touch. Neurologic: Non Focal.   Skin: No Rash.   Wounds: N/A.    CONSULTS:  GI -pending  LAB RESULTS: CBC  Lab 09/12/11 0508 09/11/11 0710 09/10/11 0933 09/09/11 2332  WBC 3.2* 3.7* 6.2 5.1  HGB 11.2* 11.3* 12.4 12.4  HCT 34.0* 34.2* 37.5 37.7  PLT 144* 132* 132* 127*  MCV 89.0 89.5 88.4 88.3  MCH 29.3 29.6 29.2 29.0  MCHC 32.9 33.0 33.1 32.9  RDW 13.2 13.3 13.5 13.4  LYMPHSABS -- -- -- 0.5*  MONOABS -- -- -- 0.5  EOSABS -- -- -- 0.1  BASOSABS -- -- -- 0.0  BANDABS -- -- -- --    Chemistries   Lab 09/12/11 0508 09/11/11 0710 09/10/11 1445 09/10/11 0933 09/09/11 2332  NA 143 137 141 -- 139  K 4.1 4.8 4.6 -- 3.7  CL 107 105 105 -- 103  CO2 28 25 28  -- 24  GLUCOSE 115* 149* 149* -- 82  BUN 10 15 19  -- 16  CREATININE 0.74 0.72 0.73 0.71 0.74  CALCIUM 9.2 8.9  9.6 -- 9.6  MG -- -- -- -- --    GFR Estimated Creatinine Clearance: 68.3 ml/min (by C-G formula based on Cr of 0.74).  Coagulation profile No results found for this basename: INR:5,PROTIME:5 in the last 168 hours  Cardiac Enzymes No results found for this basename: CK:3,CKMB:3,TROPONINI:3,MYOGLOBIN:3 in the last 168 hours  No components found with this basename: POCBNP:3 No results found for this basename: DDIMER:2 in the last 72 hours  Basename 09/10/11 0933  HGBA1C 5.8*   No results found for this basename: CHOL:2,HDL:2,LDLCALC:2,TRIG:2,CHOLHDL:2,LDLDIRECT:2 in the last 72 hours No results found for this basename: TSH,T4TOTAL,FREET3,T3FREE,THYROIDAB in the last 72 hours No results found for this basename: VITAMINB12:2,FOLATE:2,FERRITIN:2,TIBC:2,IRON:2,RETICCTPCT:2 in the last 72 hours  Basename 09/09/11 2332  LIPASE 17  AMYLASE --    Urine Studies No results found for this basename: UACOL:2,UAPR:2,USPG:2,UPH:2,UTP:2,UGL:2,UKET:2,UBIL:2,UHGB:2,UNIT:2,UROB:2,ULEU:2,UEPI:2,UWBC:2,URBC:2,UBAC:2,CAST:2,CRYS:2,UCOM:2,BILUA:2 in the last 72 hours  MICROBIOLOGY: No results found for this or any previous visit (from the past 240 hour(s)).  RADIOLOGY STUDIES/RESULTS: Nm Hepatobiliary Liver Func  09/11/2011  *RADIOLOGY REPORT*  Clinical Data:  Quadrant pain  NUCLEAR MEDICINE HEPATOBILIARY IMAGING  Technique:  Sequential images of the abdomen were obtained out to 60 minutes following intravenous administration of radiopharmaceutical.  Radiopharmaceutical:  5.0 mCi Tc-32m Choletec  Comparison:  None.  Findings: Gallbladder activity occurs after 1 hour.  Small bowel activity occurs after 30 minutes.  IMPRESSION: Cystic and common bile ducts are patent.  Original Report Authenticated By: Donavan Burnet, M.D.   US Abdomen Complete  09/10/2011  *RADIOLOGY REPORT*  Clinical Data:  Right upper quadrant pain and elevated liver function studies.  COMPLETE ABDOMINAL ULTRASOUND  Comparison:  CT  08/21/2011  Findings:  Gallbladder:  There is layering sludge in the gallbladder with mild wall thickening.  No stones are visualized. Murphy's sign is indeterminate due to patient sedation.  Common bile duct:  Normal caliber with measured diameter of about 4 mm.  Liver:  No focal lesion identified.  Within normal limits in parenchymal echogenicity.  IVC:  Appears normal.  Pancreas:  The pancreas is mostly obscured by overlying bowel gas is not well visualized.  Spleen:  Spleen length measures 10.3 cm.  Normal homogeneous parenchymal echotexture.  Right Kidney:  Right kidney length measures 10.4 cm.  No hydronephrosis.  Left Kidney:  Left kidney measures 12.3 cm length.  No hydronephrosis.  Abdominal aorta:  No aneurysm identified.  IMPRESSION: Focal gallbladder sludge and gallbladder wall thickening without obvious shadowing stones.  Nonspecific etiology.  Original Report Authenticated By: Marlon Pel, M.D.   Ct Abdomen Pelvis W Contrast  08/21/2011  *RADIOLOGY REPORT*  Clinical Data: Epigastric pain, nausea.  History of gastric lymphoma with prior chemotherapy.  CT ABDOMEN AND PELVIS WITH CONTRAST  Technique:  Multidetector CT imaging of the abdomen and pelvis was performed following the standard protocol during bolus administration of intravenous contrast.  Contrast: OMNIPAQUE IOHEXOL 300 MG/ML IV SOLN none.  Comparison: None.  Findings: Bibasilar atelectasis.  Heart is upper limits normal in size.  No effusions.  There are mildly enlarged right cardiophrenic lymph nodes.  Lymph node on image 7 measures 13 mm.  Lymph node on image 19 anterior to the liver measures 12 mm.  On the same image, a lymph node anterior to the right heart measures 9 mm.  These are short axis measurements.  Borderline and mildly enlarged celiac axis and porta hepatis lymph nodes.  Portacaval lymph node on image 25 has a short axis diameter of 19 mm.  There is extensive retroperitoneal adenopathy.  Left periaortic lymph  node on image 38 has a short axis diameter of 23 mm.  Aortocaval lymph node on the same image has a short axis diameter of 20 mm.  Adenopathy continues into the iliac chains bilaterally, right much greater than left.  Enlarged lymph node at the common iliac bifurcation on the right has a short axis diameter of 26 mm.  External iliac node on image 73 on the right has a short axis diameter of 24 mm.  Left external iliac node on image 73 has a short axis diameter of 12 mm.  Bilateral inguinal adenopathy also noted, right greater than left.  Small amount of free fluid in the pelvis.  Uterus, adnexa urinary bladder are grossly unremarkable.  Liver, spleen, pancreas, adrenals and kidneys are normal.  Stomach is grossly unremarkable. There is mild haziness throughout the mesentery with borderline and mildly enlarged mesenteric lymph nodes.  Large and small bowel are grossly unremarkable.  Aorta is calcified, non-aneurysmal.  IMPRESSION: Extensive of abdominal adenopathy including retroperitoneal, mesenteric, porta hepatis and celiac axis.  This  continues in the pelvis within the iliac chains and pelvic sidewall, right greater than left.  Also, inguinal adenopathy, right greater than left. There is a right cardiophrenic angle adenopathy as well. Findings findings most compatible with lymphoma.  Small amount of free fluid in the pelvis.  Dependent atelectasis in the lung bases.  Original Report Authenticated By: Cyndie Chime, M.D.   US Venous Img Lower Bilateral  08/19/2011  *RADIOLOGY REPORT*  Clinical Data: Right lower extremity edema.  VENOUS DUPLEX ULTRASOUND OF BILATERAL LOWER EXTREMITIES  Technique:  Gray-scale sonography with graded compression, as well as color Doppler and duplex ultrasound, were performed to evaluate the deep venous system of both lower extremities from the level of the common femoral vein through the popliteal and proximal calf veins.  Spectral Doppler was utilized to evaluate flow at rest  and with distal augmentation maneuvers.  Comparison:  None.  Findings: From the level of the common femoral vein to the popliteal vein, there is adequate color flow, compression and augmentation without evidence of a lower extremity deep venous thrombosis on either side.  Superficial veins are compressible as are visualized calf veins.  Reflux is noted bilaterally.  Bilateral inguinal adenopathy.  IMPRESSION: No evidence of lower extremity deep venous thrombosis on either side.  Bilateral inguinal adenopathy.  Etiology/significance indeterminate.  Original Report Authenticated By: Fuller Canada, M.D.    MEDICATIONS: Scheduled Meds:    . diphenhydrAMINE  25 mg Oral Once  . enoxaparin  40 mg Subcutaneous Q24H  . insulin aspart  0-9 Units Subcutaneous Q4H  . ondansetron (ZOFRAN) IV  4 mg Intravenous Q8H  . pantoprazole  40 mg Oral Q1200  . ramipril  20 mg Oral Daily  . venlafaxine  75 mg Oral Daily   Continuous Infusions:    . dextrose 5 % and 0.9 % NaCl with KCl 20 mEq/L 50 mL/hr at 09/12/11 0747   PRN Meds:.ALPRAZolam, morphine injection, technetium TC 53M mebrofenin  Antibiotics: Anti-infectives    None      Assessment/Plan: Patient Active Hospital Problem List: Epigastric pain    Assessment: Unclear etiology, possibly may be related to underlying malignancy-slowly continues to improve.   Plan: Continue with PPI .HIDA is scan negative. Recent endoscopy showed a gastric polyp.Abdominal plain x-ray shows no major abnormalities.. Advance to full liquids and slowly advanced as tolerated.Will ask GI to evaluate as well.  Jaundice   Assessment: Not sure what the exact etiology here is.LFT's continue to improve with just supportive measures-maybe it was a viral synd?  Plan: Await GI eval, will check hepatitis serology  Thrombocytopenia   Assessment: Mild,thrombocytopenia, but stable.   Plan: Monitor CBC.  Non-Hodgkin's lymphoma   Assessment: Status post chemotherapy November  2012 in [place].    Plan: Will attempt to get records from her primary oncologist.   HTN (hypertension)   Assessment: Controlled.    Plan: Continue ramipril.  Disposition: Remain inpatient for now.  DVT Prophylaxis: Lovenox.  Code Status: Full code  Maretta Bees,  MD. 09/12/2011, 11:59 AM

## 2011-09-12 NOTE — Consult Note (Signed)
Patient seen and I agree with the above documentation, including the assessment and plan. I cannot readily explain the patient's cholestasis.  The top of the differential would be obstruction from LNs in the porta hepatis, however the labs are improving with no real intervention.  Also med related cholestasis comes to mind, but there have been no new meds of late.  EGD recently by Dr. Arlyce Dice was unremarkable For now, I think the most paramount issue to oncology opinion regarding disease progression in the chest, abd and pelvis.  She was last imaged on 08/21/11.  She was initially treated 29 yrs ago, recurred around 2010, and then again in late 2010.  Last treatment was with Rituxan in Nov 2012. I have spoken with her daughter and the pt and they wish to seek further onc care here in [place].  Therefore, I recommend onc consult now to consider further therapeutic options. Certainly some of her symptoms make be lymphoma related, as the mesentery is involved. Will trend LFTs for now, if worsening then would rec MRCP to eval for obstruction as picture is more cholestatic

## 2011-09-12 NOTE — Progress Notes (Signed)
09/12/2011   1:14 PM  Pt signed authorization for disclosure of medical information form to obtain records from oncologist/hematologist in [place], [place].  I contacted MD office and left a message with the medical records department to notify them that I would be faxing the request form to them.  I also spoke with the secretary at the office who was informed of what I would be needing and to be expecting a fax from [place].  I then faxed the disclosure form to the office.  Awaiting medical records at this point.  Eunice Blase

## 2011-09-12 NOTE — Consult Note (Signed)
[facility] Consultation  Referring Provider: [facility] Primary Care Physician:  No primary provider on file. Primary Gastroenterologist:   none Reason for Consultation:  Epigastric pain, elevated LFTs  HPI: Victoria Wood is a 61 y.o. female from [place] here visiting family. She has a hx of Non Hodgkins lymphoma initially diagnosed at age 62. Lymphoma recurred in 2008, she underwent chemo with good response. She had recurrent lymphoma last fall. We saw patient in the hospital last month for weight loss and upper abdominal pain.  EGD that admission negative except for benign appearing fundic polyps. Pain was felt to be secondary to lymphoma. Patient was readmitted two days ago with epigastric pain.    Past Medical History  Diagnosis Date  . Gastric polyps     History of  . Cancer     hx of Rituxan in 07/2011  . Non-Hodgkin's lymphoma   . GERD (gastroesophageal reflux disease)   . HTN (hypertension)   . DM type 2 (diabetes mellitus, type 2)   . Asthma   . Non Hodgkin's lymphoma     Past Surgical History  Procedure Date  . Portacath placement   . Upper gastrointestinal endoscopy 02/2011    gastric polyps x 2  . Neck and thigh surgery     lymph nodes removed  . Esophagogastroduodenoscopy 08/22/2011    Procedure: ESOPHAGOGASTRODUODENOSCOPY (EGD);  Surgeon: Louis Meckel, MD;  Location: [facility];  Service: Endoscopy;  Laterality: N/A;    Prior to Admission medications   Medication Sig Start Date End Date Taking? Authorizing Provider  ALPRAZolam (XANAX) 0.25 MG tablet Take 0.25 mg by mouth 2 (two) times daily as needed. anxiety   Yes Historical Provider, MD  Cyanocobalamin (VITAMIN B 12 PO) Take 1 tablet by mouth daily.     Yes Historical Provider, MD  metFORMIN (GLUCOPHAGE) 500 MG tablet Take 500 mg by mouth 2 (two) times daily with a meal.     Yes Historical Provider, MD  omeprazole (PRILOSEC OTC) 20 MG tablet Take 1 tablet (20 mg total) by mouth 2 (two)  times daily. 08/22/11 09/22/11 Yes Srikar A Reddy  OVER THE COUNTER MEDICATION Take 3 tablets by mouth daily. Procera vitamin    Yes Historical Provider, MD  ramipril (ALTACE) 10 MG tablet Take 20 mg by mouth daily.     Yes Historical Provider, MD  venlafaxine (EFFEXOR-XR) 75 MG 24 hr capsule Take 75 mg by mouth daily.     Yes Historical Provider, MD    Current Facility-Administered Medications  Medication Dose Route Frequency Provider Last Rate Last Dose  . ALPRAZolam Prudy Feeler) tablet 0.25 mg  0.25 mg Oral BID PRN Houston Siren      . dextrose 5 % and 0.9 % NaCl with KCl 20 mEq/L infusion   Intravenous Continuous Shanker Ghimire 20 mL/hr at 09/12/11 1230    . diphenhydrAMINE (BENADRYL) capsule 25 mg  25 mg Oral Once Maren Reamer, NP   25 mg at 09/11/11 2240  . enoxaparin (LOVENOX) injection 40 mg  40 mg Subcutaneous Q24H Peter Le   40 mg at 09/12/11 0842  . insulin aspart (novoLOG) injection 0-9 Units  0-9 Units Subcutaneous Q4H Peter Le   1 Units at 09/12/11 1300  . morphine 2 MG/ML injection 1 mg  1 mg Intravenous Q4H PRN Pleas Koch, MD   1 mg at 09/12/11 0753  . ondansetron (ZOFRAN) injection 4 mg  4 mg Intravenous Q8H Pleas Koch, MD   4 mg at 09/12/11 1300  .  pantoprazole (PROTONIX) EC tablet 40 mg  40 mg Oral Q1200 Shanker Ghimire   40 mg at 09/12/11 1230  . ramipril (ALTACE) capsule 20 mg  20 mg Oral Daily Peter Le   20 mg at 09/12/11 0949  . venlafaxine (EFFEXOR-XR) 24 hr capsule 75 mg  75 mg Oral Daily Peter Le   75 mg at 09/12/11 0949    Allergies as of 09/09/2011 - Review Complete 09/09/2011  Allergen Reaction Noted  . Penicillins  08/21/2011  . Shellfish allergy  08/21/2011    Family History  Problem Relation Age of Onset  . Hypertension Mother   . Hypertension Father   . Kidney disease Father     History   Social History  . Marital Status: Married    Spouse Name: N/A    Number of Children: N/A  . Years of Education: N/A   Occupational History  . Not on file.   Social  History Main Topics  . Smoking status: Never Smoker   . Smokeless tobacco: Never Used  . Alcohol Use: 0.0 oz/week     Occasional  . Drug Use: No  . Sexually Active: No   Other Topics Concern  . Not on file   Social History Narrative  . No narrative on file    Review of Systems: Gen: Denies any fever, chills, sweats, anorexia, fatigue, weakness, malaise, weight loss, and sleep disorder CV: Denies chest pain, angina, palpitations, syncope, orthopnea, PND, peripheral edema, and claudication. Resp: Denies dyspnea at rest, dyspnea with exercise, cough, sputum, wheezing, coughing up blood, and pleurisy. GI: Denies vomiting blood, jaundice, and fecal incontinence.   Denies dysphagia or odynophagia. GU : Denies urinary burning, blood in urine, urinary frequency, urinary hesitancy, nocturnal urination, and urinary incontinence. MS: Denies joint pain, limitation of movement, and swelling, stiffness, low back pain, extremity pain. Denies muscle weakness, cramps, atrophy.  Derm: Denies rash, itching, dry skin, hives, moles, warts, or unhealing ulcers.  Psych: Denies depression, anxiety, memory loss, suicidal ideation, hallucinations, paranoia, and confusion. Heme: Denies bruising, bleeding, and enlarged lymph nodes. Neuro:  Denies any headaches, dizziness, paresthesias. Endo:  Denies any problems with DM, thyroid, adrenal function.  PHYSICAL EXAM: Vital signs in last 24 hours: Temp:  [97.8 F (36.6 C)-97.9 F (36.6 C)] 97.8 F (36.6 C) (01/03 0945) Pulse Rate:  [78-100] 89  (01/03 0945) Resp:  [19-20] 20  (01/03 0945) BP: (112-134)/(65-80) 121/79 mmHg (01/03 0945) SpO2:  [96 %-99 %] 98 % (01/03 0945) Weight:  [68.2 kg (150 lb 5.7 oz)] 150 lb 5.7 oz (68.2 kg) (01/02 2050) Last BM Date:  (Pt said it was before she was admitted in the hospital) General:  Well-developed, white female in NAD Head:  Normocephalic and atraumatic. Eyes:   No icterus.   Conjunctiva pink. Ears:  Normal auditory  acuity. Neck:  Supple; no masses felt Lungs:  Respirations even and unlabored. Lungs clear to auscultation bilaterlly.   No wheezes, crackles, or rhonchi.  Heart:  Regular rate and rhythm Abdomen:  Soft, nondistended, mild diffuse nontender. Normal bowel sounds. No appreciable masses or hepatomegaly.  Rectal:  Not performed.  Msk:  Symmetrical without gross deformities.  Extremities:  Without edema. Neurologic:  Alert and  oriented x4;  grossly normal neurologically. Skin:  Intact without significant lesions or rashes. Cervical Nodes:  No significant cervical adenopathy. Psych:  Alert and cooperative. Normal mood and affect.  LAB RESULTS:  Basename 09/12/11 0508 09/11/11 0710 09/10/11 0933  WBC 3.2* 3.7* 6.2  HGB  11.2* 11.3* 12.4  HCT 34.0* 34.2* 37.5  PLT 144* 132* 132*   BMET  Basename 09/12/11 0508 09/11/11 0710 09/10/11 1445  NA 143 137 141  K 4.1 4.8 4.6  CL 107 105 105  CO2 28 25 28   GLUCOSE 115* 149* 149*  BUN 10 15 19   CREATININE 0.74 0.72 0.73  CALCIUM 9.2 8.9 9.6   LFT  Basename 09/12/11 0508  PROT 5.7*  ALBUMIN 2.9*  AST 27  ALT 40*  ALKPHOS 126*  BILITOT 1.8*  BILIDIR 0.9*  IBILI 0.9      09/09/2011 23:32 09/10/2011 09:33 09/10/2011 14:45 09/11/2011 07:10 09/12/2011 05:08  Alkaline Phosphatase 144 (H)  138 (H) 119 (H) 126 (H)  Albumin 3.6  3.2 (L) 2.9 (L) 2.9 (L)  Lipase 17      AST 78 (H)  42 (H) 46 (H) 27  ALT 90 (H)  66 (H) 50 (H) 40 (H)  Total Protein 6.7  6.2 5.9 (L) 5.7 (L)  Bilirubin, Direct     0.9 (H)  Indirect Bilirubin     0.9  Total Bilirubin 5.8 (H)  3.3 (H) 2.5 (H) 1.8 (H)    STUDIES: Nm Hepatobiliary Liver Func  09/11/2011  *RADIOLOGY REPORT*  Clinical Data:  Quadrant pain  NUCLEAR MEDICINE HEPATOBILIARY IMAGING  Technique:  Sequential images of the abdomen were obtained out to 60 minutes following intravenous administration of radiopharmaceutical.  Radiopharmaceutical:  5.0 mCi Tc-23m Choletec  Comparison:  None.  Findings: Gallbladder  activity occurs after 1 hour.  Small bowel activity occurs after 30 minutes.  IMPRESSION: Cystic and common bile ducts are patent.  Original Report Authenticated By: Donavan Burnet, M.D.   PREVIOUS ENDOSCOPIES: EGD Dec 2012 -see HPI.  IMPRESSION / PLAN: 1. Epigastric pain and abnormal LFTs (mixed pattern). Patient hospitalized last month with epigastric pain. LFTs were normal then. Contrast CTscan revealed extensive abdominal adenopathy including retroperitoneal, mesenteric, porta hepatis and celiac axis. EGD was unremarkable. LFTs are improving. Ultrasound reveals focal gallbladder sludge and gallbladder wall thickening without obvious shadowing stones. No evidence for acute cholecystitis based on HIDA. Not sure why LFTs were elevated and whether even related to epigastric pain. It seems unlikely elevated LFTs related to progressive lymphoma since they are rapidly normalizing. Doesn't appear to be medication related as she hasn't been on anything new. She may need repeat CTscan of the abdomen.  Will review further with Dr. Rhea Belton. See #2. 2. Lymphoma, recurrent. Patient is followed by oncologist in [place] where she lives. If she isn't returning to [facility] anytime soon then we may need to involve oncology.    Thanks    LOS: 3 days   Willette Cluster  09/12/2011, 4:04 PM

## 2011-09-13 LAB — HEPATIC FUNCTION PANEL
AST: 30 U/L (ref 0–37)
Albumin: 2.9 g/dL — ABNORMAL LOW (ref 3.5–5.2)
Alkaline Phosphatase: 127 U/L — ABNORMAL HIGH (ref 39–117)
Total Bilirubin: 1.3 mg/dL — ABNORMAL HIGH (ref 0.3–1.2)
Total Protein: 5.6 g/dL — ABNORMAL LOW (ref 6.0–8.3)

## 2011-09-13 LAB — GLUCOSE, CAPILLARY
Glucose-Capillary: 102 mg/dL — ABNORMAL HIGH (ref 70–99)
Glucose-Capillary: 114 mg/dL — ABNORMAL HIGH (ref 70–99)
Glucose-Capillary: 120 mg/dL — ABNORMAL HIGH (ref 70–99)
Glucose-Capillary: 92 mg/dL (ref 70–99)

## 2011-09-13 LAB — HEPATITIS PANEL, ACUTE
HCV Ab: NEGATIVE
Hepatitis B Surface Ag: NEGATIVE

## 2011-09-13 MED ORDER — SENNA 8.6 MG PO TABS
2.0000 | ORAL_TABLET | Freq: Two times a day (BID) | ORAL | Status: DC
  Administered 2011-09-13 – 2011-09-14 (×2): 17.2 mg via ORAL
  Filled 2011-09-13 (×4): qty 2

## 2011-09-13 MED ORDER — POLYETHYLENE GLYCOL 3350 17 G PO PACK
17.0000 g | PACK | Freq: Two times a day (BID) | ORAL | Status: DC
  Administered 2011-09-13 – 2011-09-14 (×2): 17 g via ORAL
  Filled 2011-09-13 (×3): qty 1

## 2011-09-13 MED ORDER — FLEET ENEMA 7-19 GM/118ML RE ENEM
1.0000 | ENEMA | Freq: Every day | RECTAL | Status: DC | PRN
  Filled 2011-09-13: qty 1

## 2011-09-13 NOTE — Progress Notes (Signed)
PATIENT DETAILS Name: Victoria Wood Age: 61 y.o. Sex: female Date of Birth: 06-Feb-1951 Admit Date: 09/09/2011 PCP:No primary provider on file.  Subjective: Not tolerating a low residual diet. On and off intermittent abdominal pain.  Objective: Vital signs in last 24 hours: Filed Vitals:   09/12/11 1742 09/12/11 2100 09/13/11 0500 09/13/11 1000  BP: 139/87 134/74 114/69 133/83  Pulse: 92 79 80 90  Temp: 97.2 F (36.2 C) 97.1 F (36.2 C) 99 F (37.2 C) 98.2 F (36.8 C)  TempSrc: Oral Oral Oral Oral  Resp: 20 18 18 18   Height:      Weight:      SpO2: 99% 93% 93% 93%    Weight change:   Body mass index is 24.27 kg/(m^2).  Intake/Output from previous day:  Intake/Output Summary (Last 24 hours) at 09/13/11 1810 Last data filed at 09/13/11 0900  Gross per 24 hour  Intake    380 ml  Output      0 ml  Net    380 ml    PHYSICAL EXAM: Gen Exam: Awake and alert with clear speech.   Neck: Supple, No JVD.   Chest: B/L Clear. No added sounds  CVS: S1 S2 Regular, no murmurs.  Abdomen: soft, BS +, Non tender today Extremities: no edema, lower extremities warm to touch. Neurologic: Non Focal.   Skin: No Rash.   Wounds: N/A.    CONSULTS:  GI -pending  LAB RESULTS: CBC  Lab 09/12/11 0508 09/11/11 0710 09/10/11 0933 09/09/11 2332  WBC 3.2* 3.7* 6.2 5.1  HGB 11.2* 11.3* 12.4 12.4  HCT 34.0* 34.2* 37.5 37.7  PLT 144* 132* 132* 127*  MCV 89.0 89.5 88.4 88.3  MCH 29.3 29.6 29.2 29.0  MCHC 32.9 33.0 33.1 32.9  RDW 13.2 13.3 13.5 13.4  LYMPHSABS -- -- -- 0.5*  MONOABS -- -- -- 0.5  EOSABS -- -- -- 0.1  BASOSABS -- -- -- 0.0  BANDABS -- -- -- --    Chemistries   Lab 09/12/11 0508 09/11/11 0710 09/10/11 1445 09/10/11 0933 09/09/11 2332  NA 143 137 141 -- 139  K 4.1 4.8 4.6 -- 3.7  CL 107 105 105 -- 103  CO2 28 25 28  -- 24  GLUCOSE 115* 149* 149* -- 82  BUN 10 15 19  -- 16  CREATININE 0.74 0.72 0.73 0.71 0.74  CALCIUM 9.2 8.9 9.6 -- 9.6  MG -- -- -- -- --     GFR Estimated Creatinine Clearance: 68.3 ml/min (by C-G formula based on Cr of 0.74).  Coagulation profile No results found for this basename: INR:5,PROTIME:5 in the last 168 hours  Cardiac Enzymes No results found for this basename: CK:3,CKMB:3,TROPONINI:3,MYOGLOBIN:3 in the last 168 hours  No components found with this basename: POCBNP:3 No results found for this basename: DDIMER:2 in the last 72 hours No results found for this basename: HGBA1C:2 in the last 72 hours No results found for this basename: CHOL:2,HDL:2,LDLCALC:2,TRIG:2,CHOLHDL:2,LDLDIRECT:2 in the last 72 hours No results found for this basename: TSH,T4TOTAL,FREET3,T3FREE,THYROIDAB in the last 72 hours No results found for this basename: VITAMINB12:2,FOLATE:2,FERRITIN:2,TIBC:2,IRON:2,RETICCTPCT:2 in the last 72 hours No results found for this basename: LIPASE:2,AMYLASE:2 in the last 72 hours  Urine Studies No results found for this basename: UACOL:2,UAPR:2,USPG:2,UPH:2,UTP:2,UGL:2,UKET:2,UBIL:2,UHGB:2,UNIT:2,UROB:2,ULEU:2,UEPI:2,UWBC:2,URBC:2,UBAC:2,CAST:2,CRYS:2,UCOM:2,BILUA:2 in the last 72 hours  MICROBIOLOGY: No results found for this or any previous visit (from the past 240 hour(s)).  RADIOLOGY STUDIES/RESULTS: Nm Hepatobiliary Liver Func  09/11/2011  *RADIOLOGY REPORT*  Clinical Data:  Quadrant pain  NUCLEAR MEDICINE HEPATOBILIARY IMAGING  Technique:  Sequential images of the abdomen were obtained out to 60 minutes following intravenous administration of radiopharmaceutical.  Radiopharmaceutical:  5.0 mCi Tc-71m Choletec  Comparison:  None.  Findings: Gallbladder activity occurs after 1 hour.  Small bowel activity occurs after 30 minutes.  IMPRESSION: Cystic and common bile ducts are patent.  Original Report Authenticated By: Donavan Burnet, M.D.   US Abdomen Complete  09/10/2011  *RADIOLOGY REPORT*  Clinical Data:  Right upper quadrant pain and elevated liver function studies.  COMPLETE ABDOMINAL ULTRASOUND   Comparison:  CT 08/21/2011  Findings:  Gallbladder:  There is layering sludge in the gallbladder with mild wall thickening.  No stones are visualized. Murphy's sign is indeterminate due to patient sedation.  Common bile duct:  Normal caliber with measured diameter of about 4 mm.  Liver:  No focal lesion identified.  Within normal limits in parenchymal echogenicity.  IVC:  Appears normal.  Pancreas:  The pancreas is mostly obscured by overlying bowel gas is not well visualized.  Spleen:  Spleen length measures 10.3 cm.  Normal homogeneous parenchymal echotexture.  Right Kidney:  Right kidney length measures 10.4 cm.  No hydronephrosis.  Left Kidney:  Left kidney measures 12.3 cm length.  No hydronephrosis.  Abdominal aorta:  No aneurysm identified.  IMPRESSION: Focal gallbladder sludge and gallbladder wall thickening without obvious shadowing stones.  Nonspecific etiology.  Original Report Authenticated By: Marlon Pel, M.D.   Ct Abdomen Pelvis W Contrast  08/21/2011  *RADIOLOGY REPORT*  Clinical Data: Epigastric pain, nausea.  History of gastric lymphoma with prior chemotherapy.  CT ABDOMEN AND PELVIS WITH CONTRAST  Technique:  Multidetector CT imaging of the abdomen and pelvis was performed following the standard protocol during bolus administration of intravenous contrast.  Contrast: OMNIPAQUE IOHEXOL 300 MG/ML IV SOLN none.  Comparison: None.  Findings: Bibasilar atelectasis.  Heart is upper limits normal in size.  No effusions.  There are mildly enlarged right cardiophrenic lymph nodes.  Lymph node on image 7 measures 13 mm.  Lymph node on image 19 anterior to the liver measures 12 mm.  On the same image, a lymph node anterior to the right heart measures 9 mm.  These are short axis measurements.  Borderline and mildly enlarged celiac axis and porta hepatis lymph nodes.  Portacaval lymph node on image 25 has a short axis diameter of 19 mm.  There is extensive retroperitoneal adenopathy.  Left  periaortic lymph node on image 38 has a short axis diameter of 23 mm.  Aortocaval lymph node on the same image has a short axis diameter of 20 mm.  Adenopathy continues into the iliac chains bilaterally, right much greater than left.  Enlarged lymph node at the common iliac bifurcation on the right has a short axis diameter of 26 mm.  External iliac node on image 73 on the right has a short axis diameter of 24 mm.  Left external iliac node on image 73 has a short axis diameter of 12 mm.  Bilateral inguinal adenopathy also noted, right greater than left.  Small amount of free fluid in the pelvis.  Uterus, adnexa urinary bladder are grossly unremarkable.  Liver, spleen, pancreas, adrenals and kidneys are normal.  Stomach is grossly unremarkable. There is mild haziness throughout the mesentery with borderline and mildly enlarged mesenteric lymph nodes.  Large and small bowel are grossly unremarkable.  Aorta is calcified, non-aneurysmal.  IMPRESSION: Extensive of abdominal adenopathy including retroperitoneal, mesenteric, porta hepatis and celiac axis.  This  continues in the pelvis within the iliac chains and pelvic sidewall, right greater than left.  Also, inguinal adenopathy, right greater than left. There is a right cardiophrenic angle adenopathy as well. Findings findings most compatible with lymphoma.  Small amount of free fluid in the pelvis.  Dependent atelectasis in the lung bases.  Original Report Authenticated By: Cyndie Chime, M.D.   US Venous Img Lower Bilateral  08/19/2011  *RADIOLOGY REPORT*  Clinical Data: Right lower extremity edema.  VENOUS DUPLEX ULTRASOUND OF BILATERAL LOWER EXTREMITIES  Technique:  Gray-scale sonography with graded compression, as well as color Doppler and duplex ultrasound, were performed to evaluate the deep venous system of both lower extremities from the level of the common femoral vein through the popliteal and proximal calf veins.  Spectral Doppler was utilized to  evaluate flow at rest and with distal augmentation maneuvers.  Comparison:  None.  Findings: From the level of the common femoral vein to the popliteal vein, there is adequate color flow, compression and augmentation without evidence of a lower extremity deep venous thrombosis on either side.  Superficial veins are compressible as are visualized calf veins.  Reflux is noted bilaterally.  Bilateral inguinal adenopathy.  IMPRESSION: No evidence of lower extremity deep venous thrombosis on either side.  Bilateral inguinal adenopathy.  Etiology/significance indeterminate.  Original Report Authenticated By: Fuller Canada, M.D.    MEDICATIONS: Scheduled Meds:    . enoxaparin  40 mg Subcutaneous Q24H  . insulin aspart  0-9 Units Subcutaneous Q4H  . ondansetron (ZOFRAN) IV  4 mg Intravenous Q8H  . pantoprazole  40 mg Oral Q1200  . ramipril  20 mg Oral Daily  . venlafaxine  75 mg Oral Daily   Continuous Infusions:    . dextrose 5 % and 0.9 % NaCl with KCl 20 mEq/L 20 mL/hr at 09/12/11 1230   PRN Meds:.ALPRAZolam, morphine injection  Antibiotics: Anti-infectives    None      Assessment/Plan: Patient Active Hospital Problem List: Epigastric pain    Assessment: Unclear etiology, possibly may be related to underlying malignancy-slowly continues to improve.   Plan: Continue with PPI .HIDA is scan negative. Recent endoscopy showed a gastric polyp.Abdominal plain x-ray shows no major abnormalities. Overall better, if continues to do well will likely discharge tomorrow morning.  Jaundice   Assessment: Not sure what the exact etiology here is.LFT's continue to improve with just supportive measures-maybe it was a viral synd?  Plan: Await hepatitis serology  Thrombocytopenia   Assessment: Mild,thrombocytopenia, but stable.   Plan: Monitor CBC.  Non-Hodgkin's lymphoma   Assessment: Status post chemotherapy November 2012 in [place].    Plan: Appreciate oncology evaluation, further workup will  be done as an outpatient.  HTN (hypertension)   Assessment: Controlled.    Plan: Continue ramipril.  Disposition: Remain inpatient for now.  DVT Prophylaxis: Lovenox.  Code Status: Full code  Maretta Bees,  MD. 09/13/2011, 6:10 PM

## 2011-09-13 NOTE — Consult Note (Signed)
[facility] INITIAL HEMATOLOGY CONSULTATION  Referral MD:    Reason for Referral:  Patient with history of Non Hodgkin Lymphoma requiring management  HPI:  Victoria Wood is a 61 year old WF with PMH of GERD, Depression, GI polyps, HTN, DM, and non-Hodgkin lymphoma evaluated for continue management of her cancer.  She was diagnosed with non-Hodgkin lymphoma (Follicular Stage IV) when she was 61 year old.  She was successfully treated with chemotherapy and remained disease free for 29 years.  She does not remember exact chemotype since that oncology practice is no longer in service.   In 2008, she was found to have recurrent stage IV follicular disease which she underwent chemotherapy (FND-Fludarabine/Mitoxantrone/Dex?) and was in remission.  Since then,  Dr. Parks Ranger [phone]) has been following her progression with biannually PET scan with evidence of progressive disease.  In June 2012, she had small shotty adenopathy in the neck, chest, and abdomen.  Decision was for watchful observation.  In October 2012, her B symptoms came back and after evaluated and confirmed by Dr. Parks Ranger for progressive disease, she asked to be put on Rituximab and was receiving once weekly with last dose on the Wednesday prior to Thanksgiving (07/31/2011) for a total of 5 doses.  She is currently not taking any chemotherapy nor treatment for her cancer.  She does not think that the Rituximab decrease the nodes size and believe that it is getting bigger.  In addition, she also noticed a new right inguinal adenopathy that was not present when she started Rituxan this time.   Recently, for the past few months, she has noticed nausea/vomiting, RUQ abdominal pain with food consumption.  Eating a bland, small diet, her symptoms improved.  She has been admitted twice this past month.  She was noted to have extensive cholestasis.  A CT abdomen was performed on 08/21/2011 and I had the chance to review this myself.  There  was extensive adenopathy in celiac axis, porta hepatis, retroperithoneal, left periaortic, aortocaval, bilateral iliac chain, common iliac, and mesenteric adenopathy.  The largest node was in the right common iliac bifurcation measuring 2.6cm.  Her symptoms improved with conservative management.  She was admitted again on 09/09/2011 with similar symptoms.  HIDA scan on 09/11/2011 did not show cystic and common bile duct obstruction.  Again, with initial NPO and clear diet and slow diet titration, her cholestasis, nausea/vomiting have resolved.   Currently, she stated that she is doing well.  She states that she have excruciating (10/10) pain on the right groin region that was relief by Morphine overnight.  She admits to also have some ache in her right ear, some constipation, palpable nodes on the right neck, night sweat, and some weight loss.  She denies HA, blurred/double vision, congestion, difficulty swallowing, neck pain, sore throat, CP, SOB, palpitation, diarrhea, bloody urine/stool, incontinence, and swelling.   REVIEW OF SYSTEM:  The rest of the 14-point review of sytem was negative.   Past Medical History  Diagnosis Date  . Gastric polyps     History of  . Cancer     hx of Rituxan in 07/2011  . Non-Hodgkin's lymphoma   . GERD (gastroesophageal reflux disease)   . HTN (hypertension)   . DM type 2 (diabetes mellitus, type 2)   . Asthma   . Non Hodgkin's lymphoma      Past Surgical History  Procedure Date  . Portacath placement   . Upper gastrointestinal endoscopy 02/2011    gastric polyps  x 2  . Neck and thigh surgery     lymph nodes removed  . Esophagogastroduodenoscopy 08/22/2011    Procedure: ESOPHAGOGASTRODUODENOSCOPY (EGD);  Surgeon: Louis Meckel, MD;  Location: [facility];  Service: Endoscopy;  Laterality: N/A;    CURRENT MEDS: Current Facility-Administered Medications  Medication Dose Route Frequency Provider Last Rate Last Dose  . ALPRAZolam Prudy Feeler) tablet 0.25 mg   0.25 mg Oral BID PRN Houston Siren      . dextrose 5 % and 0.9 % NaCl with KCl 20 mEq/L infusion   Intravenous Continuous Shanker Ghimire 20 mL/hr at 09/12/11 1230    . enoxaparin (LOVENOX) injection 40 mg  40 mg Subcutaneous Q24H Peter Le   40 mg at 09/13/11 0824  . insulin aspart (novoLOG) injection 0-9 Units  0-9 Units Subcutaneous Q4H Peter Le   1 Units at 09/12/11 2042  . morphine 2 MG/ML injection 1 mg  1 mg Intravenous Q4H PRN Pleas Koch, MD   1 mg at 09/13/11 0545  . ondansetron (ZOFRAN) injection 4 mg  4 mg Intravenous Q8H Pleas Koch, MD   4 mg at 09/13/11 0544  . pantoprazole (PROTONIX) EC tablet 40 mg  40 mg Oral Q1200 Shanker Ghimire   40 mg at 09/12/11 1230  . ramipril (ALTACE) capsule 20 mg  20 mg Oral Daily Peter Le   20 mg at 09/13/11 1011  . venlafaxine (EFFEXOR-XR) 24 hr capsule 75 mg  75 mg Oral Daily Peter Le   75 mg at 09/13/11 1010      Allergies  Allergen Reactions  . Penicillins     unknown  . Shellfish Allergy     migraines  :  Family History  Problem Relation Age of Onset  . Hypertension Mother   . Hypertension Father   . Kidney disease Father   :  History   Social History  . Marital Status: Married    Spouse Name: N/A    Number of Children: N/A  . Years of Education: N/A   Occupational History  . Not on file.   Social History Main Topics  . Smoking status: Never Smoker   . Smokeless tobacco: Never Used  . Alcohol Use: 0.0 oz/week     Occasional  . Drug Use: No  . Sexually Active: No   Other Topics Concern  . Not on file   Social History Narrative  . No narrative on file    Physical Exam: Patient Vitals for the past 24 hrs:  BP Temp Temp src Pulse Resp SpO2  09/13/11 1000 133/83 mmHg 98.2 F (36.8 C) Oral 90  18  93 %  09/13/11 0500 114/69 mmHg 99 F (37.2 C) Oral 80  18  93 %  09/12/11 2100 134/74 mmHg 97.1 F (36.2 C) Oral 79  18  93 %  09/12/11 1742 139/87 mmHg 97.2 F (36.2 C) Oral 92  20  99 %  09/12/11 1400 116/84 mmHg 98  F (36.7 C) Oral 94  20  98 %   GENERAL:  Well developed/nourished WF resting on bed in no acute distress HEAD:   NCAT, good hair texture, no lumps EYES:   EOMI, non icteric sclera, no discharge EARS:   Intact, no lesion, no hearing impairment, no discharge NOSE:   No congestion, no discharge THROAT:  Midline thyroid and non enlarge; no JVD, no erythema    Posterior cervical lymphadenopathy x3 on right side LUNGS:  CTAB, no adventitious sounds, no accessory muscles use ABDOMEN:  (+)  BS, soft, no guarding, no ascites, tenderness to palpation at LUQ and RLQ    Spleen and liver not palpable and not enlarge EXT:   Intact, (+) pulses and equal, no lesions, no swelling, no erythema, no loss of function/ROM LYMPH NODES: Palpable shotty lymph node x3 on right posterior cervical group    No axillary lymph nodes enlargement noted    Palpable lymph node on right inguinal region about 3cm.  NEURO:  AAOX3, appropriate mentation and speech   LABS:  Lab Results  Component Value Date   WBC 3.2* 09/12/2011   HGB 11.2* 09/12/2011   HCT 34.0* 09/12/2011   PLT 144* 09/12/2011   GLUCOSE 115* 09/12/2011   ALT 38* 09/13/2011   AST 30 09/13/2011   NA 143 09/12/2011   K 4.1 09/12/2011   CL 107 09/12/2011   CREATININE 0.74 09/12/2011   BUN 10 09/12/2011   CO2 28 09/12/2011   HGBA1C 5.8* 09/10/2011    Nm Hepatobiliary Liver Func  09/11/2011  *RADIOLOGY REPORT*  Clinical Data:  Quadrant pain  NUCLEAR MEDICINE HEPATOBILIARY IMAGING  Technique:  Sequential images of the abdomen were obtained out to 60 minutes following intravenous administration of radiopharmaceutical.  Radiopharmaceutical:  5.0 mCi Tc-40m Choletec  Comparison:  None.  Findings: Gallbladder activity occurs after 1 hour.  Small bowel activity occurs after 30 minutes.  IMPRESSION: Cystic and common bile ducts are patent.  Original Report Authenticated By: Donavan Burnet, M.D.   US Abdomen Complete  09/10/2011  *RADIOLOGY REPORT*  Clinical Data:  Right upper quadrant  pain and elevated liver function studies.  COMPLETE ABDOMINAL ULTRASOUND  Comparison:  CT 08/21/2011  Findings:  Gallbladder:  There is layering sludge in the gallbladder with mild wall thickening.  No stones are visualized. Murphy's sign is indeterminate due to patient sedation.  Common bile duct:  Normal caliber with measured diameter of about 4 mm.  Liver:  No focal lesion identified.  Within normal limits in parenchymal echogenicity.  IVC:  Appears normal.  Pancreas:  The pancreas is mostly obscured by overlying bowel gas is not well visualized.  Spleen:  Spleen length measures 10.3 cm.  Normal homogeneous parenchymal echotexture.  Right Kidney:  Right kidney length measures 10.4 cm.  No hydronephrosis.  Left Kidney:  Left kidney measures 12.3 cm length.  No hydronephrosis.  Abdominal aorta:  No aneurysm identified.  IMPRESSION: Focal gallbladder sludge and gallbladder wall thickening without obvious shadowing stones.  Nonspecific etiology.  Original Report Authenticated By: Marlon Pel, M.D.   Ct Abdomen Pelvis W Contrast  08/21/2011  *RADIOLOGY REPORT*  Clinical Data: Epigastric pain, nausea.  History of gastric lymphoma with prior chemotherapy.  CT ABDOMEN AND PELVIS WITH CONTRAST  Technique:  Multidetector CT imaging of the abdomen and pelvis was performed following the standard protocol during bolus administration of intravenous contrast.  Contrast: OMNIPAQUE IOHEXOL 300 MG/ML IV SOLN none.  Comparison: None.  Findings: Bibasilar atelectasis.  Heart is upper limits normal in size.  No effusions.  There are mildly enlarged right cardiophrenic lymph nodes.  Lymph node on image 7 measures 13 mm.  Lymph node on image 19 anterior to the liver measures 12 mm.  On the same image, a lymph node anterior to the right heart measures 9 mm.  These are short axis measurements.  Borderline and mildly enlarged celiac axis and porta hepatis lymph nodes.  Portacaval lymph node on image 25 has a short axis  diameter of 19 mm.  There is  extensive retroperitoneal adenopathy.  Left periaortic lymph node on image 38 has a short axis diameter of 23 mm.  Aortocaval lymph node on the same image has a short axis diameter of 20 mm.  Adenopathy continues into the iliac chains bilaterally, right much greater than left.  Enlarged lymph node at the common iliac bifurcation on the right has a short axis diameter of 26 mm.  External iliac node on image 73 on the right has a short axis diameter of 24 mm.  Left external iliac node on image 73 has a short axis diameter of 12 mm.  Bilateral inguinal adenopathy also noted, right greater than left.  Small amount of free fluid in the pelvis.  Uterus, adnexa urinary bladder are grossly unremarkable.  Liver, spleen, pancreas, adrenals and kidneys are normal.  Stomach is grossly unremarkable. There is mild haziness throughout the mesentery with borderline and mildly enlarged mesenteric lymph nodes.  Large and small bowel are grossly unremarkable.  Aorta is calcified, non-aneurysmal.  IMPRESSION: Extensive of abdominal adenopathy including retroperitoneal, mesenteric, porta hepatis and celiac axis.  This continues in the pelvis within the iliac chains and pelvic sidewall, right greater than left.  Also, inguinal adenopathy, right greater than left. There is a right cardiophrenic angle adenopathy as well. Findings findings most compatible with lymphoma.  Small amount of free fluid in the pelvis.  Dependent atelectasis in the lung bases.  Original Report Authenticated By: Cyndie Chime, M.D.   US Venous Img Lower Bilateral  08/19/2011  *RADIOLOGY REPORT*  Clinical Data: Right lower extremity edema.  VENOUS DUPLEX ULTRASOUND OF BILATERAL LOWER EXTREMITIES  Technique:  Gray-scale sonography with graded compression, as well as color Doppler and duplex ultrasound, were performed to evaluate the deep venous system of both lower extremities from the level of the common femoral vein through the  popliteal and proximal calf veins.  Spectral Doppler was utilized to evaluate flow at rest and with distal augmentation maneuvers.  Comparison:  None.  Findings: From the level of the common femoral vein to the popliteal vein, there is adequate color flow, compression and augmentation without evidence of a lower extremity deep venous thrombosis on either side.  Superficial veins are compressible as are visualized calf veins.  Reflux is noted bilaterally.  Bilateral inguinal adenopathy.  IMPRESSION: No evidence of lower extremity deep venous thrombosis on either side.  Bilateral inguinal adenopathy.  Etiology/significance indeterminate.  Original Report Authenticated By: Fuller Canada, M.D.   Dg Abd 2 Views  09/11/2011  *RADIOLOGY REPORT*  Clinical Data: Right mid abdomen and back pain for the past 3 weeks.  Constipation.  ABDOMEN - 2 VIEW  Comparison: Abdomen and pelvis CT dated 08/21/2011.  Findings: Normal bowel gas pattern without free peritoneal air. Mild scoliosis.  Diffuse osteopenia.  IMPRESSION: No acute abnormality.  Original Report Authenticated By: Darrol Angel, M.D.    ASSESSMENT AND PLAN: Ok to d/c home from oncology standpoint  Non-Hodgkin Lymphoma (Follicular) -  Previously stage IV disease, currently unstaged for recurrent -  Patient and 2 daughters were counseled regarding CT, PET scan, node biopsy in out patient setting for restage and next step of management for her disease -  Patient and 2 daughters were also counseled regarding treatment options and required supports for each option once her disease is restage and treatment is pursue  1.  CT of chest, and pelvic  2.  PET scan for guidance in biopsy  3.  Biopsy of easiest access node or increase  uptake nodes on PET  4.  Referral for bone marrow transplant  5.  Chemotherapy with BR regimen  Patient/daughters were instructed to contact our office for these work up once discharge from this  admission.  =========================  ATTENDING'S ATTESTATION:  I personally reviewed patient's chart, examined patient myself, formulated the treatment plan as followed.     In short, Victoria Wood is a 61 yo woman with history of follicular lymphoma when she was 61 years-old.  She received chemotherapy (unknown) then with CR.  She had her 1st relapse in 2008 and underwent 18 months of chemotherapy (FND) and again had CR.  She had 2nd relapse in June 2012 per PET scan.  She was on observation until repeat PET scan in November 2012 showed disease progression.  She had single agent Rituxan in November 2012 without response per this latest restaging CT abdomen.  I discussed with patient and her two daughters that I need to restage her with CT chest, PET scan, bone marrow biopsy.  If she wants to be treated by [place], I would recommend repeat biopsy to confirm outside diagnosis that this is recurrent follicular lymphoma and not other NHL and to rule out possibility of transformation to DLBCL.  She is at high risk of recurrence with chemotherapy alone since she is currently at her 2nd relapse which did not respond to Rix[place]an.  I strongly recommended referral to a tertiary center to see if she is a candidate for autologous stem cell transplant.    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  If she is candidate for BMT, then a potential salvage chemo would be R-ICE regimen.  If she is not a candidate for BMT either now or ever, then other salvage chemo regimen such as Bendamustine/Rituxan may be considered.    PLAN:  I will order outpatient chest CT, PET, and referral to Gen Surg for biopsy and BMT evaluation once she is discharged.  OK to d/c home from hem standpoint if she is able to tolerate PO without nausea/vomiting.   The length of time of the face-to-face  encounter was 45 minutes. More than 50% of time was spent counseling and coordination of care.

## 2011-09-14 MED ORDER — POLYETHYLENE GLYCOL 3350 17 G PO PACK: 17.0000 g | PACK | Freq: Two times a day (BID) | ORAL | Status: AC

## 2011-09-14 MED ORDER — SENNA 8.6 MG PO TABS: 2.0000 | ORAL_TABLET | Freq: Two times a day (BID) | ORAL | Status: DC

## 2011-09-14 MED ORDER — ONDANSETRON HCL 4 MG PO TABS: 4.0000 mg | ORAL_TABLET | Freq: Three times a day (TID) | ORAL | Status: AC | PRN

## 2011-09-14 MED ORDER — OXYCODONE HCL 5 MG PO TABS: 5.0000 mg | ORAL_TABLET | ORAL | Status: AC | PRN

## 2011-09-14 NOTE — Discharge Summary (Signed)
PATIENT DETAILS Name: Victoria Wood Age: 61 y.o. Sex: female Date of Birth: 09-17-1950 MRN: [id number]. Admit Date: 09/09/2011 Admitting Physician: Pleas Koch, MD PCP:No primary provider on file.  PRIMARY DISCHARGE DIAGNOSIS:  Principal Problem:  *Epigastric pain Active Problems:  Non-Hodgkin's lymphoma  HTN (hypertension) Jaundice      PAST MEDICAL HISTORY: Past Medical History  Diagnosis Date  . Gastric polyps     History of  . Cancer     hx of Rituxan in 07/2011  . Non-Hodgkin's lymphoma   . GERD (gastroesophageal reflux disease)   . HTN (hypertension)   . DM type 2 (diabetes mellitus, type 2)   . Asthma   . Non Hodgkin's lymphoma     DISCHARGE MEDICATIONS: Discharge Medication List as of 09/14/2011 12:23 PM    START taking these medications   Details  ondansetron (ZOFRAN) 4 MG tablet Take 1 tablet (4 mg total) by mouth every 8 (eight) hours as needed for nausea., Starting 09/14/2011, Until Sat 09/21/11, Print    oxyCODONE (ROXICODONE) 5 MG immediate release tablet Take 1 tablet (5 mg total) by mouth every 4 (four) hours as needed for pain., Starting 09/14/2011, Until Tue 09/24/11, Print    polyethylene glycol (MIRALAX / GLYCOLAX) packet Take 17 g by mouth 2 (two) times daily., Starting 09/14/2011, Until Tue 09/17/11, No Print    senna (SENOKOT) 8.6 MG TABS Take 2 tablets (17.2 mg total) by mouth 2 (two) times daily., Starting 09/14/2011, Until Discontinued, Print      CONTINUE these medications which have NOT CHANGED   Details  ALPRAZolam (XANAX) 0.25 MG tablet Take 0.25 mg by mouth 2 (two) times daily as needed. anxiety, Until Discontinued, Historical Med    Cyanocobalamin (VITAMIN B 12 PO) Take 1 tablet by mouth daily.  , Until Discontinued, Historical Med    metFORMIN (GLUCOPHAGE) 500 MG tablet Take 500 mg by mouth 2 (two) times daily with a meal.  , Until Discontinued, Historical Med    omeprazole (PRILOSEC OTC) 20 MG tablet Take 1 tablet (20 mg total) by mouth 2 (two)  times daily., Starting 08/22/2011, Until Sun 09/22/11, Print    OVER THE COUNTER MEDICATION Take 3 tablets by mouth daily. Procera vitamin , Until Discontinued, Historical Med    ramipril (ALTACE) 10 MG tablet Take 20 mg by mouth daily.  , Until Discontinued, Historical Med    venlafaxine (EFFEXOR-XR) 75 MG 24 hr capsule Take 75 mg by mouth daily.  , Until Discontinued, Historical Med         BRIEF HPI:  See H&P, Labs, Consult and Test reports for all details in brief, patient was admitted for abdominal pain and jaundice. For further details please see the history and physical done on admission.  CONSULTATIONS:   GI and hematology/oncology  PERTINENT RADIOLOGIC STUDIES: Nm Hepatobiliary Liver Func  09/11/2011  *RADIOLOGY REPORT*  Clinical Data:  Quadrant pain  NUCLEAR MEDICINE HEPATOBILIARY IMAGING  Technique:  Sequential images of the abdomen were obtained out to 60 minutes following intravenous administration of radiopharmaceutical.  Radiopharmaceutical:  5.0 mCi Tc-56m Choletec  Comparison:  None.  Findings: Gallbladder activity occurs after 1 hour.  Small bowel activity occurs after 30 minutes.  IMPRESSION: Cystic and common bile ducts are patent.  Original Report Authenticated By: Donavan Burnet, M.D.   US Abdomen Complete  09/10/2011  *RADIOLOGY REPORT*  Clinical Data:  Right upper quadrant pain and elevated liver function studies.  COMPLETE ABDOMINAL ULTRASOUND  Comparison:  CT 08/21/2011  Findings:  Gallbladder:  There is layering sludge in the gallbladder with mild wall thickening.  No stones are visualized. Murphy's sign is indeterminate due to patient sedation.  Common bile duct:  Normal caliber with measured diameter of about 4 mm.  Liver:  No focal lesion identified.  Within normal limits in parenchymal echogenicity.  IVC:  Appears normal.  Pancreas:  The pancreas is mostly obscured by overlying bowel gas is not well visualized.  Spleen:  Spleen length measures 10.3 cm.  Normal  homogeneous parenchymal echotexture.  Right Kidney:  Right kidney length measures 10.4 cm.  No hydronephrosis.  Left Kidney:  Left kidney measures 12.3 cm length.  No hydronephrosis.  Abdominal aorta:  No aneurysm identified.  IMPRESSION: Focal gallbladder sludge and gallbladder wall thickening without obvious shadowing stones.  Nonspecific etiology.  Original Report Authenticated By: Marlon Pel, M.D.   Ct Abdomen Pelvis W Contrast  08/21/2011  *RADIOLOGY REPORT*  Clinical Data: Epigastric pain, nausea.  History of gastric lymphoma with prior chemotherapy.  CT ABDOMEN AND PELVIS WITH CONTRAST  Technique:  Multidetector CT imaging of the abdomen and pelvis was performed following the standard protocol during bolus administration of intravenous contrast.  Contrast: OMNIPAQUE IOHEXOL 300 MG/ML IV SOLN none.  Comparison: None.  Findings: Bibasilar atelectasis.  Heart is upper limits normal in size.  No effusions.  There are mildly enlarged right cardiophrenic lymph nodes.  Lymph node on image 7 measures 13 mm.  Lymph node on image 19 anterior to the liver measures 12 mm.  On the same image, a lymph node anterior to the right heart measures 9 mm.  These are short axis measurements.  Borderline and mildly enlarged celiac axis and porta hepatis lymph nodes.  Portacaval lymph node on image 25 has a short axis diameter of 19 mm.  There is extensive retroperitoneal adenopathy.  Left periaortic lymph node on image 38 has a short axis diameter of 23 mm.  Aortocaval lymph node on the same image has a short axis diameter of 20 mm.  Adenopathy continues into the iliac chains bilaterally, right much greater than left.  Enlarged lymph node at the common iliac bifurcation on the right has a short axis diameter of 26 mm.  External iliac node on image 73 on the right has a short axis diameter of 24 mm.  Left external iliac node on image 73 has a short axis diameter of 12 mm.  Bilateral inguinal adenopathy also noted,  right greater than left.  Small amount of free fluid in the pelvis.  Uterus, adnexa urinary bladder are grossly unremarkable.  Liver, spleen, pancreas, adrenals and kidneys are normal.  Stomach is grossly unremarkable. There is mild haziness throughout the mesentery with borderline and mildly enlarged mesenteric lymph nodes.  Large and small bowel are grossly unremarkable.  Aorta is calcified, non-aneurysmal.  IMPRESSION: Extensive of abdominal adenopathy including retroperitoneal, mesenteric, porta hepatis and celiac axis.  This continues in the pelvis within the iliac chains and pelvic sidewall, right greater than left.  Also, inguinal adenopathy, right greater than left. There is a right cardiophrenic angle adenopathy as well. Findings findings most compatible with lymphoma.  Small amount of free fluid in the pelvis.  Dependent atelectasis in the lung bases.  Original Report Authenticated By: Cyndie Chime, M.D.   US Venous Img Lower Bilateral  08/19/2011  *RADIOLOGY REPORT*  Clinical Data: Right lower extremity edema.  VENOUS DUPLEX ULTRASOUND OF BILATERAL LOWER EXTREMITIES  Technique:  Gray-scale sonography with graded compression,  as well as color Doppler and duplex ultrasound, were performed to evaluate the deep venous system of both lower extremities from the level of the common femoral vein through the popliteal and proximal calf veins.  Spectral Doppler was utilized to evaluate flow at rest and with distal augmentation maneuvers.  Comparison:  None.  Findings: From the level of the common femoral vein to the popliteal vein, there is adequate color flow, compression and augmentation without evidence of a lower extremity deep venous thrombosis on either side.  Superficial veins are compressible as are visualized calf veins.  Reflux is noted bilaterally.  Bilateral inguinal adenopathy.  IMPRESSION: No evidence of lower extremity deep venous thrombosis on either side.  Bilateral inguinal adenopathy.   Etiology/significance indeterminate.  Original Report Authenticated By: Fuller Canada, M.D.   Dg Abd 2 Views  09/11/2011  *RADIOLOGY REPORT*  Clinical Data: Right mid abdomen and back pain for the past 3 weeks.  Constipation.  ABDOMEN - 2 VIEW  Comparison: Abdomen and pelvis CT dated 08/21/2011.  Findings: Normal bowel gas pattern without free peritoneal air. Mild scoliosis.  Diffuse osteopenia.  IMPRESSION: No acute abnormality.  Original Report Authenticated By: Darrol Angel, M.D.   PERTINENT LAB RESULTS: CBC:  Basename 09/12/11 0508  WBC 3.2*  HGB 11.2*  HCT 34.0*  PLT 144*   CMET CMP     Component Value Date/Time   NA 143 09/12/2011 0508   K 4.1 09/12/2011 0508   CL 107 09/12/2011 0508   CO2 28 09/12/2011 0508   GLUCOSE 115* 09/12/2011 0508   BUN 10 09/12/2011 0508   CREATININE 0.74 09/12/2011 0508   CALCIUM 9.2 09/12/2011 0508   PROT 5.6* 09/13/2011 0530   ALBUMIN 2.9* 09/13/2011 0530   AST 30 09/13/2011 0530   ALT 38* 09/13/2011 0530   ALKPHOS 127* 09/13/2011 0530   BILITOT 1.3* 09/13/2011 0530   GFRNONAA 89* 09/12/2011 0508   GFRAA >90 09/12/2011 0508    GFR Estimated Creatinine Clearance: 68.3 ml/min (by C-G formula based on Cr of 0.74). No results found for this basename: LIPASE:2,AMYLASE:2 in the last 72 hours No results found for this basename: CKTOTAL:3,CKMB:3,CKMBINDEX:3,TROPONINI:3 in the last 72 hours No components found with this basename: POCBNP:3 No results found for this basename: DDIMER:2 in the last 72 hours No results found for this basename: HGBA1C:2 in the last 72 hours No results found for this basename: CHOL:2,HDL:2,LDLCALC:2,TRIG:2,CHOLHDL:2,LDLDIRECT:2 in the last 72 hours No results found for this basename: TSH,T4TOTAL,FREET3,T3FREE,THYROIDAB in the last 72 hours No results found for this basename: VITAMINB12:2,FOLATE:2,FERRITIN:2,TIBC:2,IRON:2,RETICCTPCT:2 in the last 72 hours Coags: No results found for this basename: PT:2,INR:2 in the last 72 hours Microbiology: No  results found for this or any previous visit (from the past 240 hour(s)).   BRIEF HOSPITAL COURSE:   Principal Problem:  *Epigastric pain -This is unfortunately the patient's third episode of abdominal pain that is of unexplained etiology. She's had a recent CT scan of her abdomen done last admission we did show extensive lymphadenopathy. She also had a endoscopy done last admission which showed a gastric polyp. At this point since we have no other explanation it is presumed that her abdominal pain may be related to her underlying lymphoma. On examination during this admission abdomen was very soft without any evidence of rigidity or rebound. A abdominal x-ray done during this admission was also unremarkable. Gastroenterology consultation was obtained, they did not have any further recommendations. Fortunately with conservative measures and supportive care patient's abdominal pain has  by and large is resolved. She was initially kept nothing by mouth, however she was then slowly transitioned to a low residual diet which she is tolerating. Current plans are to discharge her and have a followup with oncology for further continued workup.  Active Problems: Jaundice -Patient had evidence of significant bilirubin elevation on admission. This was also off unknown etiology however suspicions are full perhaps a cholestatic process. This also resolved with supportive care. Viral hepatitis serology was obtained and these came back negative as well. Gastroenterology was consulted. A HIDA scan done early during the admission was negative for any biliary obstruction.   Non-Hodgkin's lymphoma -Oncology consultation was obtained, Dr.Ha saw the patient in consultation, and his recommendations are for outpatient followup with him where the patient will have a PET scan and a possible bone marrow biopsy. I have given the number of [facility] [place] [facility] for the patient to contact and make appointments to biopsy a lymph  node. At this point further management of this will be deferred to be done in an outpatient setting. Patient does followup with a oncologist however that oncologist is located in [place], and the patient will be staying here for the next few months and will now be following at the cancer center and [facility] with Dr. Overton Mam OF DISCHARGE:  Subjective:   Victoria Wood today has no headache,no chest abdominal pain,no new weakness tingling or numbness, feels much better wants to go home today. Now tolerating a regular diet,  Objective:   Blood pressure 120/70, pulse 80, temperature 97.9 F (36.6 C), temperature source Oral, resp. rate 18, height 5\' 6"  (1.676 m), weight 68.2 kg (150 lb 5.7 oz), SpO2 95.00%.  Intake/Output Summary (Last 24 hours) at 09/14/11 1842 Last data filed at 09/14/11 1300  Gross per 24 hour  Intake    960 ml  Output      0 ml  Net    960 ml    Exam Awake Alert, Oriented *3, No new F.N deficits, Normal affect [place].AT,PERRAL Supple Neck,No JVD, No cervical lymphadenopathy appriciated.  Symmetrical Chest wall movement, Good air movement bilaterally, CTAB RRR,No Gallops,Rubs or new Murmurs, No Parasternal Heave +ve B.Sounds, Abd Soft, Non tender, No organomegaly appriciated, No rebound -guarding or rigidity. No Cyanosis, Clubbing or edema, No new Rash or bruise. No Right or left leg swelling observed.  DISPOSITION: Home  DISCHARGE INSTRUCTIONS:    Follow-up Information    Follow up with Jethro Bolus, MD. Make an appointment in 1 week.   Contact information:   [address] [place] [address] [phone]       Follow up with [facility]. Call in 5 days.   Contact information:   [address] [place] [address] [phone]        Total Time spent on discharge equals 45 minutes.  SignedJeoffrey Massed 09/14/2011 6:42 PM

## 2011-09-14 NOTE — Progress Notes (Signed)
09/13/10 nsg 1223 D/c pt to home d/c instructions given; iv taken out;  Konrad Dolores RN

## 2011-09-16 ENCOUNTER — Other Ambulatory Visit: Payer: Self-pay | Admitting: Oncology

## 2011-09-16 DIAGNOSIS — C8590 Non-Hodgkin lymphoma, unspecified, unspecified site: Secondary | ICD-10-CM

## 2011-09-17 ENCOUNTER — Telehealth: Payer: Self-pay | Admitting: Oncology

## 2011-09-17 NOTE — Telephone Encounter (Signed)
S/w the pt regarding her pet scan/ct scan appts along with the instructions. Pt is aware of her hosp f/u appt in jan.

## 2011-09-18 ENCOUNTER — Telehealth: Payer: Self-pay | Admitting: Oncology

## 2011-09-18 NOTE — Telephone Encounter (Signed)
S/w janice from [facility] and she stated that the pt is already scheduled with dr Henrene Hawking

## 2011-09-19 ENCOUNTER — Telehealth: Payer: Self-pay | Admitting: *Deleted

## 2011-09-19 ENCOUNTER — Other Ambulatory Visit: Payer: Self-pay | Admitting: *Deleted

## 2011-09-19 NOTE — Telephone Encounter (Signed)
Called husband and explained pt's schedule for appt w/ Dr. Donell Beers.  Explained that Dr. Donell Beers appt is not the actual biopsy appt,  That probably won't be made until pt seen by Dr. Donell Beers in office.  Gave husband the ph # to [facility] to call to confirm appt and ask about biopsy. Instructed him to call us when they know the date of biopsy and we can move pt's appt up to see Dr. Gaylyn Rong after Bx is done.  He verbalized understanding.

## 2011-09-19 NOTE — Telephone Encounter (Signed)
Message copied by Wende Mott on Thu Sep 19, 2011  3:28 PM ------      Message from: HA, Raliegh Ip T      Created: Thu Sep 19, 2011  2:52 PM       Please call pt's family.  They see Dr. Donell Beers on 09/24/2011.  This is ONLY an office visit (not biopsy date).             Please ask patient to let us know as soon as she knows when she is scheduled for node biopsy with Dr. Donell Beers.  I can move up her appointment (for now 10/09/11) if I know when biopsy is performed.              Thanks.                   ----- Message -----         From: Orrin Brigham         Sent: 09/19/2011   1:47 PM           To: Jethro Bolus, MD            Pt's husband called with many concerns regarding this pt with questions i could not answer advised the pt's husband to direct those questions to the nurse and maybe she could elaborate more information. Pt's husband also wants to move the md visit up closer to the biopsy appt d/t if possible. whrn i called [facility] to schedule her an appt she had already been scheduled with dr Donell Beers. Please advise?

## 2011-09-23 ENCOUNTER — Other Ambulatory Visit (HOSPITAL_BASED_OUTPATIENT_CLINIC_OR_DEPARTMENT_OTHER): Payer: BC Managed Care – PPO

## 2011-09-23 ENCOUNTER — Encounter (HOSPITAL_COMMUNITY)
Admission: RE | Admit: 2011-09-23 | Discharge: 2011-09-23 | Disposition: A | Discharge status: Home / Self Care | Payer: BC Managed Care – PPO | Source: Ambulatory Visit | Attending: Oncology | Admitting: Oncology

## 2011-09-23 ENCOUNTER — Encounter (HOSPITAL_COMMUNITY): Payer: Self-pay

## 2011-09-23 ENCOUNTER — Other Ambulatory Visit: Payer: Self-pay | Admitting: Oncology

## 2011-09-23 DIAGNOSIS — C8590 Non-Hodgkin lymphoma, unspecified, unspecified site: Secondary | ICD-10-CM

## 2011-09-23 DIAGNOSIS — C8589 Other specified types of non-Hodgkin lymphoma, extranodal and solid organ sites: Secondary | ICD-10-CM | POA: Insufficient documentation

## 2011-09-23 DIAGNOSIS — R599 Enlarged lymph nodes, unspecified: Secondary | ICD-10-CM | POA: Insufficient documentation

## 2011-09-23 LAB — COMPREHENSIVE METABOLIC PANEL
Alkaline Phosphatase: 90 U/L (ref 39–117)
BUN: 12 mg/dL (ref 6–23)
Glucose, Bld: 113 mg/dL — ABNORMAL HIGH (ref 70–99)
Sodium: 143 mEq/L (ref 135–145)
Total Bilirubin: 0.7 mg/dL (ref 0.3–1.2)
Total Protein: 6.2 g/dL (ref 6.0–8.3)

## 2011-09-23 LAB — CBC WITH DIFFERENTIAL/PLATELET
EOS%: 2.9 % (ref 0.0–7.0)
Eosinophils Absolute: 0.2 10*3/uL (ref 0.0–0.5)
LYMPH%: 12.1 % — ABNORMAL LOW (ref 14.0–49.7)
MCH: 29.8 pg (ref 25.1–34.0)
MCV: 90.3 fL (ref 79.5–101.0)
MONO%: 4.9 % (ref 0.0–14.0)
NEUT#: 4.9 10*3/uL (ref 1.5–6.5)
Platelets: 238 10*3/uL (ref 145–400)
RBC: 4.25 10*6/uL (ref 3.70–5.45)

## 2011-09-23 MED ORDER — IOHEXOL 300 MG/ML  SOLN
80.0000 mL | Freq: Once | INTRAMUSCULAR | Status: AC | PRN
  Administered 2011-09-23: 80 mL via INTRAVENOUS

## 2011-09-23 MED ORDER — FLUDEOXYGLUCOSE F - 18 (FDG) INJECTION
16.3000 | Freq: Once | INTRAVENOUS | Status: AC | PRN
  Administered 2011-09-23: 16.3 via INTRAVENOUS

## 2011-09-24 ENCOUNTER — Ambulatory Visit (INDEPENDENT_AMBULATORY_CARE_PROVIDER_SITE_OTHER): Payer: BC Managed Care – PPO | Admitting: General Surgery

## 2011-09-24 ENCOUNTER — Encounter (INDEPENDENT_AMBULATORY_CARE_PROVIDER_SITE_OTHER): Payer: Self-pay | Admitting: General Surgery

## 2011-09-24 ENCOUNTER — Other Ambulatory Visit: Payer: Self-pay | Admitting: Oncology

## 2011-09-24 VITALS — BP 156/92 | HR 100 | Temp 98.6°F | Resp 18 | Ht 66.0 in | Wt 148.4 lb

## 2011-09-24 DIAGNOSIS — C8590 Non-Hodgkin lymphoma, unspecified, unspecified site: Secondary | ICD-10-CM

## 2011-09-24 DIAGNOSIS — C8589 Other specified types of non-Hodgkin lymphoma, extranodal and solid organ sites: Secondary | ICD-10-CM

## 2011-09-24 NOTE — Patient Instructions (Addendum)
I will get lymph node from Left axilla first.  If large enough, will stop there.  The risks below of infection and swelling are much higher in the right groin due to the extensive enlargement of lymph nodes that goes all the way up into abdomen.    Risks of surgery include bleeding, infection, damage to adjacent structures, numbness or swelling of incision, possible need to drain fluid from surgical site, possible arm or leg swelling.    Follow up pathology results will be Dr. Gaylyn Rong.    I will see you around 2-3 weeks after surgery.

## 2011-09-24 NOTE — Progress Notes (Signed)
Chief Complaint  Patient presents with  . Other    new cancer- eval lymphoma, rt groin bx    HISTORY: Pt is 61 year old female with recurrent lymphoma.  This is her second recurrence. She has night sweats.  Her worst pain is the right lower quadrant and right leg.  She has also started having R leg swelling.  She has nausea/vomiting.  She has also been feeling quite weak.  Dr. Ha refers her for lymph node biopsy to compare profile to plan chemo and +/- referral for BMT.     Past Medical History  Diagnosis Date  . Gastric polyps     History of  . GERD (gastroesophageal reflux disease)   . HTN (hypertension)   . DM type 2 (diabetes mellitus, type 2)   . Asthma   . Non Hodgkin's lymphoma   . Cancer     hx of Rituxan in 07/2011  . Non-Hodgkin's lymphoma   . Anemia   . Blood transfusion   . Hyperlipidemia     Past Surgical History  Procedure Date  . Portacath placement   . Upper gastrointestinal endoscopy 02/2011    gastric polyps x 2  . Neck and thigh surgery     lymph nodes removed  . Esophagogastroduodenoscopy 08/22/2011    Procedure: ESOPHAGOGASTRODUODENOSCOPY (EGD);  Surgeon: Robert D Kaplan, MD;  Location: [facility];  Service: Endoscopy;  Laterality: N/A;    Current Outpatient Prescriptions  Medication Sig Dispense Refill  . ALPRAZolam (XANAX) 0.25 MG tablet Take 0.25 mg by mouth 2 (two) times daily as needed. anxiety      . Cyanocobalamin (VITAMIN B 12 PO) Take 1 tablet by mouth daily.        . HYDROcodone-acetaminophen (VICODIN) 5-500 MG per tablet Ad lib.      . metFORMIN (GLUCOPHAGE) 500 MG tablet Take 500 mg by mouth 2 (two) times daily with a meal.        . oxyCODONE (ROXICODONE) 5 MG immediate release tablet Take 1 tablet (5 mg total) by mouth every 4 (four) hours as needed for pain.  30 tablet  0  . PRILOSEC OTC 20 MG tablet daily.      . ramipril (ALTACE) 10 MG tablet Take 20 mg by mouth daily.        . senna (SENOKOT) 8.6 MG TABS Take 2 tablets (17.2 mg  total) by mouth 2 (two) times daily.  30 each  0  . venlafaxine (EFFEXOR-XR) 75 MG 24 hr capsule Take 75 mg by mouth daily.           Allergies  Allergen Reactions  . Penicillins     unknown  . Shellfish Allergy     migraines     Family History  Problem Relation Age of Onset  . Hypertension Mother   . Cancer Mother     breast  . Hypertension Father   . Kidney disease Father   . Cancer Father     throat  . Cancer Maternal Grandmother     breast     History   Social History  . Marital Status: Married    Spouse Name: N/A    Number of Children: N/A  . Years of Education: N/A   Social History Main Topics  . Smoking status: Never Smoker   . Smokeless tobacco: Never Used  . Alcohol Use: 0.0 oz/week     Occasional  . Drug Use: No  . Sexually Active: No   Other Topics   Concern  . None   Social History Narrative  . None     REVIEW OF SYSTEMS - PERTINENT POSITIVES ONLY: 12 point review of systems negative other than HPI and PMH except for cough.    EXAM: Filed Vitals:   09/24/11 1609  BP: 156/92  Pulse: 100  Temp: 98.6 F (37 C)  Resp: 18    Gen:  No acute distress.  Well nourished and well groomed.   Neurological: Alert and oriented to person, place, and time. Coordination normal.  Head: Normocephalic and atraumatic.  Eyes: Conjunctivae are normal. Pupils are equal, round, and reactive to light. No scleral icterus.  Neck: Normal range of motion. Neck supple. No tracheal deviation or thyromegaly present. R posterior lymph node enlarged.   Cardiovascular: Normal rate, regular rhythm, normal heart sounds and intact distal pulses.  Exam reveals no gallop and no friction rub.  No murmur heard. Respiratory: Effort normal.  No respiratory distress. No chest wall tenderness. Breath sounds normal.  No wheezes, rales or rhonchi.  GI: Soft. Bowel sounds are normal. The abdomen is soft and nontender.  There is no rebound and no guarding.  Musculoskeletal: Normal  range of motion. Extremities are nontender.  Lymphadenopathy: Adenopathy in all lymphatic basins.  Worst in R groin.  Good size node in L axilla.   There are varices in RLQ skin. Skin: Skin is warm and dry. No rash noted. No diaphoresis. No erythema. No pallor. No clubbing, cyanosis, or edema.   Psychiatric: Normal mood and affect. Behavior is normal. Judgment and thought content normal.    LABORATORY RESULTS: Available labs are reviewed in EPIC    RADIOLOGY RESULTS: Chest CT:   IMPRESSION:  1. Widespread adenopathy within the supraclavicular, mediastinal,  hilar and axillary lymph nodes. Adenopathy extends to the abdomen  in the retrocrural and para aortic locations.  2. No evidence of pulmonary metastasis.   PET: IMPRESSION:  1. Intensely hypermetabolic lymphadenopathy involving the  cervical lymph nodes, supraclavicular lymph nodes, mediastinal  lymph nodes, retroperitoneal lymph nodes, and pelvic lymph nodes.  2. Lesions most accessible for biopsy would include the left  supraclavicular lymph node and right inguinal lymph nodes.  3. Hypermetabolic activity within the spleen is most consistent  with splenic lymphoma involvement.  Original Report Authenticated By: STEWART EDMUNDS, M.D.   ASSESSMENT AND PLAN: Non-Hodgkin's lymphoma Will plan lymph node biopsy.   Will shoot for left axilla first.  If lymph node appears inadequate, will get R inguinal lymph node.   The R groin has lymph nodes going all the way up aortic bifurcation, and pt experiencing leg swelling.  This area would have higher risk of complications such as lymphocele, infection, or worsening leg swelling.   Advised pt that Dr. Ha will base further recommendations on lymph node biopsy.  Pt was advised of risks of procedure.   She understands and wishes to proceed.        Dontay Harm L Melquisedec Journey MD Surgical Oncology, General and Endocrine Surgery [facility]      Visit Diagnoses: 1.  Non-Hodgkin's lymphoma     Primary Care Physician: No primary provider on file.    

## 2011-09-24 NOTE — Assessment & Plan Note (Signed)
Will plan lymph node biopsy.   Will shoot for left axilla first.  If lymph node appears inadequate, will get R inguinal lymph node.   The R groin has lymph nodes going all the way up aortic bifurcation, and pt experiencing leg swelling.  This area would have higher risk of complications such as lymphocele, infection, or worsening leg swelling.   Advised pt that Dr. Gaylyn Rong will base further recommendations on lymph node biopsy.  Pt was advised of risks of procedure.   She understands and wishes to proceed.

## 2011-09-25 ENCOUNTER — Telehealth: Payer: Self-pay | Admitting: *Deleted

## 2011-09-25 ENCOUNTER — Encounter (HOSPITAL_COMMUNITY): Payer: Self-pay | Admitting: Pharmacy Technician

## 2011-09-25 NOTE — Telephone Encounter (Signed)
Husband called to inform that pt's Bx is scheduled for 10/02/11 at 3:30pm.  Per Dr. Gaylyn Rong, pt to keep office visit as already scheduled on 10/09/11.  Husband verbalized understanding.

## 2011-09-25 NOTE — Telephone Encounter (Signed)
Call from pt's husband stating Dr. Donell Beers is planning on biopsy of axillary node instead of a groin lymph node as Dr. Gaylyn Rong had said.  Explained to Mr. Stith that it is the surgeon's discretion which node to biopsy and this is ok w/ Dr. Gaylyn Rong.  Reminded him to let us know when the biopsy is scheduled and then we will get appt made to see Dr. Gaylyn Rong a few days after that.  He verbalized understanding and also reports pt's appt w/ Dr. Greggory Stallion at [facility] is this coming Friday.

## 2011-09-27 ENCOUNTER — Other Ambulatory Visit (HOSPITAL_COMMUNITY): Payer: BC Managed Care – PPO

## 2011-09-30 ENCOUNTER — Encounter (HOSPITAL_COMMUNITY)
Admission: RE | Admit: 2011-09-30 | Discharge: 2011-09-30 | Disposition: A | Discharge status: Home / Self Care | Payer: BC Managed Care – PPO | Source: Ambulatory Visit | Attending: General Surgery | Admitting: General Surgery

## 2011-09-30 ENCOUNTER — Encounter (HOSPITAL_COMMUNITY): Payer: Self-pay

## 2011-09-30 HISTORY — DX: Adverse effect of unspecified anesthetic, initial encounter: T41.45XA

## 2011-09-30 HISTORY — DX: Other complications of anesthesia, initial encounter: T88.59XA

## 2011-09-30 NOTE — Patient Instructions (Signed)
[name]  09/30/2011   Your procedure is scheduled on:  10/02/11 330pm-430pm  Report to [facility] at 130 pm  Call this number if you have problems the morning of surgery: [phone]   Remember:   Do not eat food:After Midnight.  May have clear liquids:until 0900AN THEN NPO .  Clear liquids include soda, tea, black coffee, apple or grape juice, broth.  Take these medicines the morning of surgery with A SIP OF WATER:    Do not wear jewelry, make-up or nail polish.  Do not wear lotions, powders, or perfumes.   Do not shave 48 hours prior to surgery.  Do not bring valuables to the hospital.  Contacts, dentures or bridgework may not be worn into surgery.  Leave suitcase in the car. After surgery it may be brought to your room.  For patients admitted to the hospital, checkout time is 11:00 AM the day of discharge.       Special Instructions: CHG Shower Use Special Wash: 1/2 bottle night before surgery and 1/2 bottle morning of surgery. SHOWER CHIN TO TOES WITH CHG.   Wash face and private parts with reguilar soap.     Please read over the following fact sheets that you were given: MRSA Information, coughing and deep breathing exercises, leg exercises

## 2011-09-30 NOTE — Pre-Procedure Instructions (Signed)
CBC,CMEt doen 09/23/11 in [facility]  Chest CT 09/23/11 in [facility]  EKG 08/21/11 [facility]

## 2011-10-02 ENCOUNTER — Encounter (HOSPITAL_COMMUNITY): Payer: Self-pay | Admitting: Anesthesiology

## 2011-10-02 ENCOUNTER — Ambulatory Visit (HOSPITAL_COMMUNITY): Payer: BC Managed Care – PPO | Admitting: Anesthesiology

## 2011-10-02 ENCOUNTER — Ambulatory Visit (HOSPITAL_COMMUNITY)
Admission: RE | Admit: 2011-10-02 | Discharge: 2011-10-02 | Disposition: A | Discharge status: Home / Self Care | Payer: BC Managed Care – PPO | Source: Ambulatory Visit | Attending: General Surgery | Admitting: General Surgery

## 2011-10-02 ENCOUNTER — Other Ambulatory Visit (INDEPENDENT_AMBULATORY_CARE_PROVIDER_SITE_OTHER): Payer: Self-pay | Admitting: General Surgery

## 2011-10-02 ENCOUNTER — Telehealth (INDEPENDENT_AMBULATORY_CARE_PROVIDER_SITE_OTHER): Payer: Self-pay

## 2011-10-02 ENCOUNTER — Encounter (HOSPITAL_COMMUNITY)
Admission: RE | Disposition: A | Discharge status: Home / Self Care | Payer: Self-pay | Source: Ambulatory Visit | Attending: General Surgery

## 2011-10-02 ENCOUNTER — Encounter (HOSPITAL_COMMUNITY): Payer: Self-pay | Admitting: *Deleted

## 2011-10-02 DIAGNOSIS — C8584 Other specified types of non-Hodgkin lymphoma, lymph nodes of axilla and upper limb: Secondary | ICD-10-CM | POA: Insufficient documentation

## 2011-10-02 DIAGNOSIS — J45909 Unspecified asthma, uncomplicated: Secondary | ICD-10-CM | POA: Insufficient documentation

## 2011-10-02 DIAGNOSIS — R599 Enlarged lymph nodes, unspecified: Secondary | ICD-10-CM | POA: Insufficient documentation

## 2011-10-02 DIAGNOSIS — E785 Hyperlipidemia, unspecified: Secondary | ICD-10-CM | POA: Insufficient documentation

## 2011-10-02 DIAGNOSIS — I10 Essential (primary) hypertension: Secondary | ICD-10-CM | POA: Insufficient documentation

## 2011-10-02 DIAGNOSIS — K219 Gastro-esophageal reflux disease without esophagitis: Secondary | ICD-10-CM | POA: Insufficient documentation

## 2011-10-02 DIAGNOSIS — E119 Type 2 diabetes mellitus without complications: Secondary | ICD-10-CM | POA: Insufficient documentation

## 2011-10-02 DIAGNOSIS — Z79899 Other long term (current) drug therapy: Secondary | ICD-10-CM | POA: Insufficient documentation

## 2011-10-02 DIAGNOSIS — C8589 Other specified types of non-Hodgkin lymphoma, extranodal and solid organ sites: Secondary | ICD-10-CM

## 2011-10-02 DIAGNOSIS — Z01812 Encounter for preprocedural laboratory examination: Secondary | ICD-10-CM | POA: Insufficient documentation

## 2011-10-02 HISTORY — PX: LYMPH NODE BIOPSY: SHX201

## 2011-10-02 LAB — GLUCOSE, CAPILLARY: Glucose-Capillary: 157 mg/dL — ABNORMAL HIGH (ref 70–99)

## 2011-10-02 SURGERY — AXILLARY LYMPH NODE BIOPSY
Anesthesia: General | Site: Axilla | Laterality: Right | Wound class: Clean

## 2011-10-02 MED ORDER — PROPOFOL 10 MG/ML IV EMUL
INTRAVENOUS | Status: DC | PRN
  Administered 2011-10-02: 160 mg via INTRAVENOUS

## 2011-10-02 MED ORDER — ACETAMINOPHEN 10 MG/ML IV SOLN
INTRAVENOUS | Status: DC | PRN
  Administered 2011-10-02: 1000 mg via INTRAVENOUS

## 2011-10-02 MED ORDER — PROMETHAZINE HCL 25 MG/ML IJ SOLN: 6.2500 mg | INTRAMUSCULAR | Status: DC | PRN

## 2011-10-02 MED ORDER — ONDANSETRON HCL 4 MG/2ML IJ SOLN
INTRAMUSCULAR | Status: DC | PRN
  Administered 2011-10-02: 4 mg via INTRAVENOUS

## 2011-10-02 MED ORDER — DEXAMETHASONE SODIUM PHOSPHATE 10 MG/ML IJ SOLN
INTRAMUSCULAR | Status: DC | PRN
  Administered 2011-10-02: 10 mg via INTRAVENOUS

## 2011-10-02 MED ORDER — LACTATED RINGERS IV SOLN
INTRAVENOUS | Status: DC
  Administered 2011-10-02: 1000 mL via INTRAVENOUS

## 2011-10-02 MED ORDER — MIDAZOLAM HCL 5 MG/5ML IJ SOLN
INTRAMUSCULAR | Status: DC | PRN
  Administered 2011-10-02: 2 mg via INTRAVENOUS

## 2011-10-02 MED ORDER — BUPIVACAINE-EPINEPHRINE 0.5% -1:200000 IJ SOLN
INTRAMUSCULAR | Status: DC | PRN
  Administered 2011-10-02: 5 mL

## 2011-10-02 MED ORDER — FENTANYL CITRATE 0.05 MG/ML IJ SOLN
INTRAMUSCULAR | Status: DC | PRN
  Administered 2011-10-02: 50 ug via INTRAVENOUS

## 2011-10-02 MED ORDER — ONDANSETRON HCL 4 MG/2ML IJ SOLN
4.0000 mg | Freq: Once | INTRAMUSCULAR | Status: AC
  Administered 2011-10-02: 4 mg via INTRAVENOUS

## 2011-10-02 MED ORDER — DROPERIDOL 2.5 MG/ML IJ SOLN
INTRAMUSCULAR | Status: DC | PRN
  Administered 2011-10-02: 0.625 mg via INTRAVENOUS

## 2011-10-02 MED ORDER — CIPROFLOXACIN IN D5W 400 MG/200ML IV SOLN
400.0000 mg | INTRAVENOUS | Status: AC
  Administered 2011-10-02: 400 mg via INTRAVENOUS

## 2011-10-02 MED ORDER — LACTATED RINGERS IV SOLN
INTRAVENOUS | Status: DC | PRN
  Administered 2011-10-02 (×2): via INTRAVENOUS

## 2011-10-02 MED ORDER — LIDOCAINE HCL (PF) 1 % IJ SOLN
INTRAMUSCULAR | Status: DC | PRN
  Administered 2011-10-02: 5 mL

## 2011-10-02 MED ORDER — LIDOCAINE HCL 1 % IJ SOLN
INTRAMUSCULAR | Status: DC | PRN
  Administered 2011-10-02: 50 mg via INTRADERMAL

## 2011-10-02 MED ORDER — FENTANYL CITRATE 0.05 MG/ML IJ SOLN
25.0000 ug | INTRAMUSCULAR | Status: DC | PRN
  Administered 2011-10-02: 50 ug via INTRAVENOUS

## 2011-10-02 MED ORDER — DEXTROSE 50 % IV SOLN
12.5000 g | Freq: Once | INTRAVENOUS | Status: AC
  Administered 2011-10-02: 12.5 g via INTRAVENOUS

## 2011-10-02 SURGICAL SUPPLY — 2 items
DERMABOND ADVANCED (GAUZE/BANDAGES/DRESSINGS) ×1
DERMABOND ADVANCED .7 DNX12 (GAUZE/BANDAGES/DRESSINGS) ×2 IMPLANT

## 2011-10-02 NOTE — Transfer of Care (Signed)
Immediate Anesthesia Transfer of Care Note  Patient: Victoria Wood  Procedure(s) Performed:  AXILLARY LYMPH NODE BIOPSY - Left Axillary Lymph Node Biopsy  Patient Location: PACU  Anesthesia Type: General  Level of Consciousness: awake and alert   Airway & Oxygen Therapy: Patient Spontanous Breathing and Patient connected to face mask oxygen  Post-op Assessment: Report given to PACU RN and Post -op Vital signs reviewed and stable  Post vital signs: Reviewed and stable  Complications: No apparent anesthesia complications

## 2011-10-02 NOTE — Interval H&P Note (Signed)
History and Physical Interval Note:  10/02/2011 2:27 PM  Victoria Wood  has presented today for surgery, with the diagnosis of lymphoma  The various methods of treatment have been discussed with the patient and family. After consideration of risks, benefits and other options for treatment, the patient has consented to  Procedure(s): Left AXILLARY LYMPH NODE BIOPSY or Right INGUINAL LYMPH NODE BIOPSY as a surgical intervention .  The patients' history has been reviewed, patient examined, no change in status, stable for surgery.  I have reviewed the patients' chart and labs.  Questions were answered to the patient's satisfaction.     Oluwadamilare Tobler

## 2011-10-02 NOTE — Brief Op Note (Signed)
10/02/2011  2:33 PM  PATIENT:  Victoria Wood  61 y.o. female  PRE-OPERATIVE DIAGNOSIS:  Recurrent non-hodgkin's lymphoma  POST-OPERATIVE DIAGNOSIS:  Same  PROCEDURE:  Procedure Left AXILLARY LYMPH NODE BIOPSY  SURGEON:  Surgeon(s): Almond Lint, MD  ANESTHESIA:   general  EBL:   Minimal  LOCAL MEDICATIONS USED:  BUPIVICAINE 5 CC and LIDOCAINE 5 CC  SPECIMEN:  Source of Specimen:  L axilla matted 2-3 enlarged lymph nodes.    DISPOSITION OF SPECIMEN:  PATHOLOGY  COUNTS:  YES  DICTATION: .Other Dictation: Dictation Number [id number]  PLAN OF CARE: Discharge to home after PACU  PATIENT DISPOSITION:  PACU - hemodynamically stable.

## 2011-10-02 NOTE — Anesthesia Procedure Notes (Signed)
Procedure Name: LMA Insertion Date/Time: 10/02/2011 3:13 PM Performed by: [place], Savvas Roper C Pre-anesthesia Checklist: Patient identified, Timeout performed, Emergency Drugs available, Suction available and Patient being monitored Patient Re-evaluated:Patient Re-evaluated prior to inductionOxygen Delivery Method: Circle System Utilized Preoxygenation: Pre-oxygenation with 100% oxygen Intubation Type: IV induction LMA: LMA inserted LMA Size: 4.0 Number of attempts: 1 Dental Injury: Teeth and Oropharynx as per pre-operative assessment

## 2011-10-02 NOTE — Progress Notes (Signed)
Pt verbalized understanding of discharge instructions. Home with daughter at 33.

## 2011-10-02 NOTE — Anesthesia Preprocedure Evaluation (Signed)
Anesthesia Evaluation  Patient identified by MRN, date of birth, ID band Patient awake    Reviewed: Allergy & Precautions, H&P , NPO status , Patient's Chart, lab work & pertinent test results  Airway Mallampati: II TM Distance: >3 FB Neck ROM: Full    Dental No notable dental hx.    Pulmonary asthma ,  clear to auscultation  Pulmonary exam normal       Cardiovascular hypertension, neg cardio ROS Regular Normal    Neuro/Psych Negative Neurological ROS  Negative Psych ROS   GI/Hepatic GERD-  ,(+) Hepatitis -  Endo/Other  Diabetes mellitus-  Renal/GU negative Renal ROS  Genitourinary negative   Musculoskeletal negative musculoskeletal ROS (+)   Abdominal   Peds negative pediatric ROS (+)  Hematology negative hematology ROS (+)   Anesthesia Other Findings   Reproductive/Obstetrics negative OB ROS                           Anesthesia Physical Anesthesia Plan  ASA: III  Anesthesia Plan: General   Post-op Pain Management:    Induction: Intravenous  Airway Management Planned: LMA  Additional Equipment:   Intra-op Plan:   Post-operative Plan:   Informed Consent: I have reviewed the patients History and Physical, chart, labs and discussed the procedure including the risks, benefits and alternatives for the proposed anesthesia with the patient or authorized representative who has indicated his/her understanding and acceptance.   Dental advisory given  Plan Discussed with: CRNA and Surgeon  Anesthesia Plan Comments:         Anesthesia Quick Evaluation

## 2011-10-02 NOTE — Telephone Encounter (Signed)
LMOM pt has post op appointment with Dr. Donell Beers on 10/25/11.

## 2011-10-02 NOTE — H&P (View-Only) (Signed)
Chief Complaint  Patient presents with  . Other    new cancer- eval lymphoma, rt groin bx    HISTORY: Pt is 61 year old female with recurrent lymphoma.  This is her second recurrence. She has night sweats.  Her worst pain is the right lower quadrant and right leg.  She has also started having R leg swelling.  She has nausea/vomiting.  She has also been feeling quite weak.  Dr. Gaylyn Rong refers her for lymph node biopsy to compare profile to plan chemo and +/- referral for BMT.     Past Medical History  Diagnosis Date  . Gastric polyps     History of  . GERD (gastroesophageal reflux disease)   . HTN (hypertension)   . DM type 2 (diabetes mellitus, type 2)   . Asthma   . Non Hodgkin's lymphoma   . Cancer     hx of Rituxan in 07/2011  . Non-Hodgkin's lymphoma   . Anemia   . Blood transfusion   . Hyperlipidemia     Past Surgical History  Procedure Date  . Portacath placement   . Upper gastrointestinal endoscopy 02/2011    gastric polyps x 2  . Neck and thigh surgery     lymph nodes removed  . Esophagogastroduodenoscopy 08/22/2011    Procedure: ESOPHAGOGASTRODUODENOSCOPY (EGD);  Surgeon: Louis Meckel, MD;  Location: [facility];  Service: Endoscopy;  Laterality: N/A;    Current Outpatient Prescriptions  Medication Sig Dispense Refill  . ALPRAZolam (XANAX) 0.25 MG tablet Take 0.25 mg by mouth 2 (two) times daily as needed. anxiety      . Cyanocobalamin (VITAMIN B 12 PO) Take 1 tablet by mouth daily.        Marland Kitchen HYDROcodone-acetaminophen (VICODIN) 5-500 MG per tablet Ad lib.      . metFORMIN (GLUCOPHAGE) 500 MG tablet Take 500 mg by mouth 2 (two) times daily with a meal.        . oxyCODONE (ROXICODONE) 5 MG immediate release tablet Take 1 tablet (5 mg total) by mouth every 4 (four) hours as needed for pain.  30 tablet  0  . PRILOSEC OTC 20 MG tablet daily.      . ramipril (ALTACE) 10 MG tablet Take 20 mg by mouth daily.        Marland Kitchen senna (SENOKOT) 8.6 MG TABS Take 2 tablets (17.2 mg  total) by mouth 2 (two) times daily.  30 each  0  . venlafaxine (EFFEXOR-XR) 75 MG 24 hr capsule Take 75 mg by mouth daily.           Allergies  Allergen Reactions  . Penicillins     unknown  . Shellfish Allergy     migraines     Family History  Problem Relation Age of Onset  . Hypertension Mother   . Cancer Mother     breast  . Hypertension Father   . Kidney disease Father   . Cancer Father     throat  . Cancer Maternal Grandmother     breast     History   Social History  . Marital Status: Married    Spouse Name: N/A    Number of Children: N/A  . Years of Education: N/A   Social History Main Topics  . Smoking status: Never Smoker   . Smokeless tobacco: Never Used  . Alcohol Use: 0.0 oz/week     Occasional  . Drug Use: No  . Sexually Active: No   Other Topics  Concern  . None   Social History Narrative  . None     REVIEW OF SYSTEMS - PERTINENT POSITIVES ONLY: 12 point review of systems negative other than HPI and PMH except for cough.    EXAM: Filed Vitals:   09/24/11 1609  BP: 156/92  Pulse: 100  Temp: 98.6 F (37 C)  Resp: 18    Gen:  No acute distress.  Well nourished and well groomed.   Neurological: Alert and oriented to person, place, and time. Coordination normal.  Head: Normocephalic and atraumatic.  Eyes: Conjunctivae are normal. Pupils are equal, round, and reactive to light. No scleral icterus.  Neck: Normal range of motion. Neck supple. No tracheal deviation or thyromegaly present. R posterior lymph node enlarged.   Cardiovascular: Normal rate, regular rhythm, normal heart sounds and intact distal pulses.  Exam reveals no gallop and no friction rub.  No murmur heard. Respiratory: Effort normal.  No respiratory distress. No chest wall tenderness. Breath sounds normal.  No wheezes, rales or rhonchi.  GI: Soft. Bowel sounds are normal. The abdomen is soft and nontender.  There is no rebound and no guarding.  Musculoskeletal: Normal  range of motion. Extremities are nontender.  Lymphadenopathy: Adenopathy in all lymphatic basins.  Worst in R groin.  Good size node in L axilla.   There are varices in RLQ skin. Skin: Skin is warm and dry. No rash noted. No diaphoresis. No erythema. No pallor. No clubbing, cyanosis, or edema.   Psychiatric: Normal mood and affect. Behavior is normal. Judgment and thought content normal.    LABORATORY RESULTS: Available labs are reviewed in EPIC    RADIOLOGY RESULTS: Chest CT:   IMPRESSION:  1. Widespread adenopathy within the supraclavicular, mediastinal,  hilar and axillary lymph nodes. Adenopathy extends to the abdomen  in the retrocrural and para aortic locations.  2. No evidence of pulmonary metastasis.   PET: IMPRESSION:  1. Intensely hypermetabolic lymphadenopathy involving the  cervical lymph nodes, supraclavicular lymph nodes, mediastinal  lymph nodes, retroperitoneal lymph nodes, and pelvic lymph nodes.  2. Lesions most accessible for biopsy would include the left  supraclavicular lymph node and right inguinal lymph nodes.  3. Hypermetabolic activity within the spleen is most consistent  with splenic lymphoma involvement.  Original Report Authenticated By: Genevive Bi, M.D.   ASSESSMENT AND PLAN: Non-Hodgkin's lymphoma Will plan lymph node biopsy.   Will shoot for left axilla first.  If lymph node appears inadequate, will get R inguinal lymph node.   The R groin has lymph nodes going all the way up aortic bifurcation, and pt experiencing leg swelling.  This area would have higher risk of complications such as lymphocele, infection, or worsening leg swelling.   Advised pt that Dr. Gaylyn Rong will base further recommendations on lymph node biopsy.  Pt was advised of risks of procedure.   She understands and wishes to proceed.        Maudry Diego MD Surgical Oncology, General and Endocrine Surgery [facility]      Visit Diagnoses: 1.  Non-Hodgkin's lymphoma     Primary Care Physician: No primary provider on file.

## 2011-10-02 NOTE — Anesthesia Postprocedure Evaluation (Signed)
  Anesthesia Post-op Note  Patient: Victoria Wood  Procedure(s) Performed:  AXILLARY LYMPH NODE BIOPSY - Left Axillary Lymph Node Biopsy  Patient Location: PACU  Anesthesia Type: General  Level of Consciousness: awake and alert   Airway and Oxygen Therapy: Patient Spontanous Breathing  Post-op Pain: mild  Post-op Assessment: Post-op Vital signs reviewed, Patient's Cardiovascular Status Stable, Respiratory Function Stable, Patent Airway and No signs of Nausea or vomiting  Post-op Vital Signs: stable  Complications: No apparent anesthesia complications

## 2011-10-03 ENCOUNTER — Other Ambulatory Visit: Payer: BC Managed Care – PPO | Admitting: Lab

## 2011-10-03 ENCOUNTER — Other Ambulatory Visit (HOSPITAL_COMMUNITY): Payer: BC Managed Care – PPO

## 2011-10-03 NOTE — Op Note (Signed)
[name], Victoria Wood                ACCOUNT NO.:  [id number]  MEDICAL RECORD NO.:  [id number]  LOCATION:  [facility]                         FACILITY:  [facility]  PHYSICIAN:  Almond Lint, MD       DATE OF BIRTH:  22-Jul-1951  DATE OF PROCEDURE: DATE OF DISCHARGE:  10/02/2011                              OPERATIVE REPORT   PREOPERATIVE DIAGNOSIS:  Recurrent non-Hodgkin lymphoma.  POSTOPERATIVE DIAGNOSIS:  Recurrent non-Hodgkin lymphoma.  PROCEDURE:  Left axillary lymph node biopsy.  SURGEON:  Almond Lint, MD  ANESTHESIA:  General and local.  FINDINGS:  Matted enlarged axillary nodes.  Assessment, 2 to 3 matted enlarged lymph nodes to Pathology for lymphoma workup.  Counts were correct.  PROCEDURE IN DETAIL:  Victoria Wood was identified in the holding area and taken to the operating room where she was placed on the operating room table.  General anesthesia was induced.  Her right groin and left axilla were prepped and draped in sterile fashion.  Time- out was performed according to the surgical safety check list.  When all was correct, we continued.  The axilla was palpated and area overlying the enlarged node was opened with a #10 blade.  The subcutaneous tissues were divided with cautery. The Weitlander retractor was used to assist with visualization. The pectoral fascia was opened with cautery.  Blunt dissection was used to identify the enlarged lymph node.  This was elevated gently with an Allis.  The lymphovascular channels around the enlarged lymph node complex was clipped with Hemoclips.  The cautery was used to take the specimen out.  This was passed off the table and sent for lymphoma workup.  The axilla was then irrigated and hemostasis was achieved with the cautery.  Local anesthetic was infiltrated into the wound.  The skin was re-approximated with interrupted 3-0 Vicryl deep dermal sutures and Monocryl running 4-0 subcuticular sutures.  The wound was then cleaned, dried, and  dressed with Dermabond.  The patient was awakened from anesthesia and taken to PACU in stable condition.  Needle, sponge, and instrument counts were correct.     Almond Lint, MD    FB/MEDQ  D:  10/02/2011  T:  10/03/2011  Job:  [id number]  cc:   Exie Parody, M.D.

## 2011-10-04 ENCOUNTER — Ambulatory Visit (HOSPITAL_BASED_OUTPATIENT_CLINIC_OR_DEPARTMENT_OTHER): Payer: BC Managed Care – PPO | Admitting: Oncology

## 2011-10-04 ENCOUNTER — Other Ambulatory Visit: Payer: Self-pay | Admitting: Oncology

## 2011-10-04 VITALS — BP 147/77 | HR 96 | Temp 97.9°F | Ht 66.0 in | Wt 151.7 lb

## 2011-10-04 DIAGNOSIS — C8589 Other specified types of non-Hodgkin lymphoma, extranodal and solid organ sites: Secondary | ICD-10-CM

## 2011-10-04 DIAGNOSIS — Z23 Encounter for immunization: Secondary | ICD-10-CM

## 2011-10-04 DIAGNOSIS — K838 Other specified diseases of biliary tract: Secondary | ICD-10-CM

## 2011-10-04 MED ORDER — PNEUMOCOCCAL VAC POLYVALENT 25 MCG/0.5ML IJ INJ
0.5000 mL | INJECTION | Freq: Once | INTRAMUSCULAR | Status: DC
  Filled 2011-10-04: qty 0.5

## 2011-10-04 NOTE — Progress Notes (Signed)
[facility] OFFICE PROGRESS NOTE  Cc:  No primary provider on file.  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   PAST THERAPY:  She was diagnosed with non-Hodgkin lymphoma (Follicular Stage IV) when she was 61 year old. She was successfully treated with chemotherapy and remained disease free for 29 years. She does not remember exact chemoregimen since that oncology practice is no longer in service. In 2008, she was found to have recurrent stage IV follicular disease which she underwent chemotherapy (FND-Fludarabine/Mitoxantrone/Dex?) and was in remission. Since then, Dr. Parks Ranger [phone]) has been following her progression with biannually PET scan with evidence of progressive disease. In June 2012, she had small shotty adenopathy in the neck, chest, and abdomen. Decision was for watchful observation. In October 2012, her B symptoms came back and after evaluated and confirmed by Dr. Parks Ranger with progressive disease, she received single agent weekly Rituximab x 5 doses with last dose on11/21/2012.  She has since transferred her care to [place], [place].    CURRENT THERAPY: here to finalize treatment option.   INTERVAL HISTORY: Victoria Wood 61 y.o. female returns to discuss result of biopsy.  She has been having intermittent midepigastric and RUQ abdominal pain. Pain is crampy, worst at night, no relation with eating or bowel movement.  Pain is relieved with Vicodin and Oxycodone.  She has nausea but no vomiting.  She has not noticed jaundice, darkening urine, or chalky stool.  She has intermittent hematochezia which she thought is due to constipation.  She still has palpable nodes in neck, axilla, groin.  The painful right inguinal LAD no longer causes her pain; however, there is still extensive RLE edema.  She does not have  calf/thigh pain.  She has mild fatigue; however, she is still able to take care of her 62-year old grandson.    Patient denies headache, visual changes, confusion, drenching night sweats, mucositis, odynophagia, dysphagia, nausea vomiting, jaundice, chest pain, palpitation, shortness of breath, dyspnea on exertion, productive cough, gum bleeding, epistaxis, hematemesis, hemoptysis, abdominal swelling, early satiety, melena,  hematuria, skin rash, spontaneous bleeding, joint swelling, joint pain, heat or cold intolerance, bowel bladder incontinence, back pain, focal motor weakness, paresthesia, depression, suicidal or homocidal ideation, feeling hopelessness.   MEDICAL HISTORY: Past Medical History  Diagnosis Date  . Gastric polyps     History of  . GERD (gastroesophageal reflux disease)   . HTN (hypertension)   . DM type 2 (diabetes mellitus, type 2)   . Asthma   . Non Hodgkin's lymphoma   . Cancer     hx of Rituxan in 07/2011  . Non-Hodgkin's lymphoma   . Anemia   . Blood transfusion   . Hyperlipidemia   . Complication of anesthesia     has woken up during surgery   . Pneumonia     hx of pneumonia     SURGICAL HISTORY:  Past Surgical History  Procedure Date  . Portacath placement   . Upper gastrointestinal endoscopy 02/2011    gastric polyps x 2  . Neck and thigh surgery     lymph nodes removed  . Esophagogastroduodenoscopy 08/22/2011    Procedure: ESOPHAGOGASTRODUODENOSCOPY (EGD);  Surgeon: Louis Meckel, MD;  Location: [facility];  Service: Endoscopy;  Laterality: N/A;    MEDICATIONS: Current Outpatient Prescriptions  Medication Sig Dispense Refill  . acetaminophen (TYLENOL) 325  MG tablet Take 325-650 mg by mouth every 6 (six) hours as needed. Pain       . albuterol (PROVENTIL HFA;VENTOLIN HFA) 108 (90 BASE) MCG/ACT inhaler Inhale 2 puffs into the lungs every 6 (six) hours as needed. Wheezing       . ALPRAZolam (XANAX) 0.25 MG tablet Take 0.25 mg by mouth 2 (two)  times daily as needed. anxiety      . HYDROcodone-acetaminophen (VICODIN) 5-500 MG per tablet Take 1 tablet by mouth every 6 (six) hours as needed. Pain       . hydroxypropyl methylcellulose (ISOPTO TEARS) 2.5 % ophthalmic solution Place 1 drop into both eyes daily.      . metFORMIN (GLUCOPHAGE) 500 MG tablet Take 500 mg by mouth 2 (two) times daily as needed.       Marland Kitchen oxycodone (OXY-IR) 5 MG capsule Take 5 mg by mouth every 4 (four) hours as needed. Pain       . PRILOSEC OTC 20 MG tablet Take 20 mg by mouth 2 (two) times daily.       . ramipril (ALTACE) 10 MG tablet Take 20 mg by mouth daily after breakfast.       . senna (SENOKOT) 8.6 MG TABS Take 2 tablets by mouth daily as needed. Constipation       . venlafaxine (EFFEXOR-XR) 75 MG 24 hr capsule Take 75 mg by mouth daily after breakfast.       . Cyanocobalamin (VITAMIN B 12 PO) Take 1 tablet by mouth daily.        Current Facility-Administered Medications  Medication Dose Route Frequency Provider Last Rate Last Dose  . pneumococcal 23 valent vaccine (PNU-IMMUNE) injection 0.5 mL  0.5 mL Intramuscular Tomorrow-1000 Jethro Bolus, MD        ALLERGIES:  is allergic to penicillins and shellfish allergy.  REVIEW OF SYSTEMS:  The rest of the 14-point review of system was negative.   Filed Vitals:   10/04/11 1556  BP: 147/77  Pulse: 96  Temp: 97.9 F (36.6 C)   Wt Readings from Last 3 Encounters:  10/04/11 151 lb 11.2 oz (68.811 kg)  09/30/11 151 lb 11.2 oz (68.811 kg)  09/24/11 148 lb 6.4 oz (67.314 kg)   ECOG Performance status: 1  PHYSICAL EXAMINATION:  General: thin-appearing woman in no acute distress.  Eyes:  no scleral icterus.  ENT:  There were no oropharyngeal lesions.  Neck was without thyromegaly.  Lymphatics:  There was extensive cervical, axillary, bilateral inguinal adenopathy.  Respiratory: lungs were clear bilaterally without wheezing or crackles.  Cardiovascular:  Regular rate and rhythm, S1/S2, without murmur, rub or  gallop.  There was right leg edema.  GI:  abdomen was soft, flat, nontender, nondistended, without organomegaly. I could feel abdominal masses.   Muscoloskeletal:  no spinal tenderness of palpation of vertebral spine.  Skin exam was without echymosis, petichae.  Neuro exam was nonfocal.  Patient was able to get on and off exam table without assistance.  Gait was normal.  Patient was alerted and oriented.  Attention was good.   Language was appropriate.  Mood was normal without depression.  Speech was not pressured.  Thought content was not tangential.         LABORATORY/RADIOLOGY DATA:  Lab Results  Component Value Date   WBC 6.1 09/23/2011   HGB 12.7 09/23/2011   HCT 38.4 09/23/2011   PLT 238 09/23/2011   GLUCOSE 113* 09/23/2011   ALT 11 09/23/2011   AST 15  09/23/2011   NA 143 09/23/2011   K 4.4 09/23/2011   CL 104 09/23/2011   CREATININE 0.78 09/23/2011   BUN 12 09/23/2011   CO2 28 09/23/2011   HGBA1C 5.8* 09/10/2011    Ct Chest W Contrast  09/23/2011  *RADIOLOGY REPORT*  Clinical Data: Follicular lymphoma.  Non Hodgkin's lymphoma diagnosed 2008 with chemotherapy completed 2008 and rotuxin completed  2012  CT CHEST WITH CONTRAST  Technique:  Multidetector CT imaging of the chest was performed following the standard protocol during bolus administration of intravenous contrast.  Contrast: 80mL OMNIPAQUE IOHEXOL 300 MG/ML IV SOLN  Comparison: PET CT scan 09/23/2011, CT scan 08/21/2011 for a can be no  Findings: Port in the right anterior chest wall.  Mildly enlarged bilateral axillary lymph nodes.  For example 16 mm short axis left axillary lymph node.  There are enlarged supraclavicular lymph nodes with a 16 mm short axis left supraclavicular lymph node (image #7).  There are enlarged paratracheal, periaortic and hilar lymph nodes. For example 14 mm right lower paratracheal lymph node.  A 20 mm. periaortic lymph node posterior to the left mainstem bronchus.  No pericardial effusion.  Esophagus appears  normal.  Mild enlarged precordial lymph nodes measuring up to 14 mm short axis.  There are no suspicious pulmonary nodules.  Airways are normal.  Limited view of the upper abdomen demonstrates periportal and retrocrural lymphadenopathy.  IMPRESSION:  1.  Widespread adenopathy within the supraclavicular, mediastinal, hilar  and axillary lymph nodes.  Adenopathy extends to the abdomen in the  retrocrural and para aortic locations.  2.  No evidence of pulmonary metastasis.  Original Report Authenticated By: Genevive Bi, M.D.   Nm Hepatobiliary Liver Func  09/11/2011  *RADIOLOGY REPORT*  Clinical Data:  Quadrant pain  NUCLEAR MEDICINE HEPATOBILIARY IMAGING  Technique:  Sequential images of the abdomen were obtained out to 60 minutes following intravenous administration of radiopharmaceutical.  Radiopharmaceutical:  5.0 mCi Tc-85m Choletec  Comparison:  None.  Findings: Gallbladder activity occurs after 1 hour.  Small bowel activity occurs after 30 minutes.  IMPRESSION: Cystic and common bile ducts are patent.  Original Report Authenticated By: Donavan Burnet, M.D.   US Abdomen Complete  09/10/2011  *RADIOLOGY REPORT*  Clinical Data:  Right upper quadrant pain and elevated liver function studies.  COMPLETE ABDOMINAL ULTRASOUND  Comparison:  CT 08/21/2011  Findings:  Gallbladder:  There is layering sludge in the gallbladder with mild wall thickening.  No stones are visualized. Murphy's sign is indeterminate due to patient sedation.  Common bile duct:  Normal caliber with measured diameter of about 4 mm.  Liver:  No focal lesion identified.  Within normal limits in parenchymal echogenicity.  IVC:  Appears normal.  Pancreas:  The pancreas is mostly obscured by overlying bowel gas is not well visualized.  Spleen:  Spleen length measures 10.3 cm.  Normal homogeneous parenchymal echotexture.  Right Kidney:  Right kidney length measures 10.4 cm.  No hydronephrosis.  Left Kidney:  Left kidney measures 12.3 cm length.  No  hydronephrosis.  Abdominal aorta:  No aneurysm identified.  IMPRESSION: Focal gallbladder sludge and gallbladder wall thickening without obvious shadowing stones.  Nonspecific etiology.  Original Report Authenticated By: Marlon Pel, M.D.   Nm Pet Image Initial (pi) Skull Base To Thigh.  I personally reviewed this PET scan and showed the images to the patient and her daughter and son-in-law who were here with her today.   09/23/2011  *RADIOLOGY REPORT*  Clinical Data:  Subsequent treatment strategy for follicular lymphoma., non Hodgkin's lymphoma.  NUCLEAR MEDICINE PET SKULL BASE TO THIGH  Fasting Blood Glucose:  111  Technique:  16.3 mCi F-18 FDG was injected intravenously.   CT data was obtained and used for attenuation correction and anatomic localization only.  (This was not acquired as a diagnostic CT examination.) Additional exam technical data entered  on technologist worksheet.  Comparison: CT 08/21/2011  Findings:  Neck: Intensely hypermetabolic cervical lymph nodes.  The highest lymph nodes are hypermetabolic level II lymph nodes which extends. above the hyoid bone.  Most intense nodes are hypermetabolic supraclavicular lymph nodes.  For example left supraclavicular lymph node measuring 16 mm short axis (image 55) with SUV max = 8.6.  Chest: There are hypermetabolic paratracheal, hilar, bilateral hypermetabolic axial lymph nodes, internal mammary lymph nodes, and precordial lymph nodes.  For example right lower paratracheal lymph node measures 17 mm short axis with SUV max = 6.8.  Review lung parenchyma demonstrates no suspicious pulmonary nodules.  Abdomen / Pelvis:There is bulky retroperitoneal lymphadenopathy which extends from the retrocrural lymph nodes to the aortic bifurcation.  Hypermetabolic nodes extend along the common iliac vessels but are much more bulky on the right than the left.  For example right  periaortic nodal mass measures 40 mm short axis SUV max = 8.8.  Bulky nodal  conglomerate along the right external iliac vessels measure up to 3.6 cm short axis dimension SUV max = 11.0.  There are hypermetabolic right inguinal lymph nodes.  There are hypermetabolic periportal lymph nodes additionally with similar intense metabolic activity.  The spleen demonstrates mildly increased metabolic activity with SUV max = 4.4.  There is swelling of the proximal right lower extremity with lymph edema similar to prior CT.  Skeleton:No focal hypermetabolic activity to suggest skeletal metastasis.  IMPRESSION:  1.  Intensely hypermetabolic lymphadenopathy involving the cervical lymph nodes, supraclavicular lymph nodes, mediastinal lymph nodes, retroperitoneal lymph nodes, and pelvic lymph nodes. 2.  Lesions most accessible for biopsy would include the left supraclavicular lymph node and right inguinal lymph nodes. 3.  Hypermetabolic activity within the spleen is most consistent with splenic lymphoma involvement.  Original Report Authenticated By: Genevive Bi, M.D.    ASSESSMENT AND PLAN:   1.  GERD:  She is on Prilosec.  2.  HTN:  She is on Ramipril.  3.  DM, type II:  She is on Metformin.  4.  Depression, anxiety:  She is on Effexor XR.  5.  Intermittent cholestatis:  From #5 below.  Recent work up did not show evidence of biliary disease.  6.  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    Staging:  Stage IV given splenic involvement.  Bone marrow biopsy is pending next week to rule out bone marrow involvement (prior to bone marrow transplant).  Treatment:  I discussed the case today with Dr. Marlaine Hind after result of biopsy came out.  He saw the patient last week and had recommended bone marrow transplant after induction chemotherapy.  He recommended at least 3 cycles of bendamustine/Rituxan and restage with PET after the 3rd cycle.  If she has response, the 4th cycle of chemo will be R-ICE followed by autologous hematopoietic stem cell  transplant. I agreed with this recommendation. Given the fact that she had history of fludarabine-containing chemotherapy I'm afraid that 3 cycles of R-ICE will be difficult for her to mobilize stem cell.  I also recommended Neulasta injection on  day 3 chemotherapy cycle to decrease her risk of neutropenic fever.  I discussed with the patient and her relative side effects of this chemotherapy BR regimen which include limited to hair thinning, mucositis, nausea vomiting, skin rash, cytopenia, infection, bleeding, infusion reaction, tumor lysis syndrome, myalgia, arthralgia.  The patient and her relatives expressed informed understanding and wished to start chemotherapy as soon as possible.  In preparation for chemotherapy, she was arranged for chemo class next week.  She had received a influenza vaccination this season. However she has not received her Pneumovax in the past 5 years. I arranged for this next week as well comes back for chemotherapy class.  I gave her prescriptions for Compazine and Zofran as needed for nausea vomiting. I gave her prescription  for acyclovir to decrease her risk of herpes reactivation. I gave her prescription for allopurinol to decrease the risk of tumor lysis syndrome.  She had hepatitis check in the hospital and was negative hepatitis B and C.  For followup, I will see her again on Wednesday, 10/09/2011 the day of the first dose  of chemotherapy for any last minute questions. Her husband will join her that day and I will answer any question that he may have.  The length of time of the face-to-face encounter was 30 minutes. More than 50% of time was spent counseling and coordination of care.

## 2011-10-07 ENCOUNTER — Ambulatory Visit (HOSPITAL_BASED_OUTPATIENT_CLINIC_OR_DEPARTMENT_OTHER): Payer: BC Managed Care – PPO

## 2011-10-07 ENCOUNTER — Other Ambulatory Visit: Payer: BC Managed Care – PPO

## 2011-10-07 DIAGNOSIS — Z23 Encounter for immunization: Secondary | ICD-10-CM

## 2011-10-07 DIAGNOSIS — C8590 Non-Hodgkin lymphoma, unspecified, unspecified site: Secondary | ICD-10-CM

## 2011-10-08 ENCOUNTER — Other Ambulatory Visit: Payer: Self-pay | Admitting: Oncology

## 2011-10-08 ENCOUNTER — Ambulatory Visit (HOSPITAL_BASED_OUTPATIENT_CLINIC_OR_DEPARTMENT_OTHER): Payer: BC Managed Care – PPO | Admitting: Oncology

## 2011-10-08 ENCOUNTER — Other Ambulatory Visit (HOSPITAL_BASED_OUTPATIENT_CLINIC_OR_DEPARTMENT_OTHER): Payer: BC Managed Care – PPO | Admitting: Lab

## 2011-10-08 ENCOUNTER — Ambulatory Visit: Payer: BC Managed Care – PPO | Admitting: Oncology

## 2011-10-08 ENCOUNTER — Other Ambulatory Visit (HOSPITAL_COMMUNITY)
Admission: RE | Admit: 2011-10-08 | Discharge: 2011-10-08 | Disposition: A | Discharge status: Home / Self Care | Payer: BC Managed Care – PPO | Source: Ambulatory Visit | Attending: Oncology | Admitting: Oncology

## 2011-10-08 VITALS — BP 113/67 | HR 85 | Temp 97.1°F

## 2011-10-08 DIAGNOSIS — C8299 Follicular lymphoma, unspecified, extranodal and solid organ sites: Secondary | ICD-10-CM | POA: Insufficient documentation

## 2011-10-08 DIAGNOSIS — C8590 Non-Hodgkin lymphoma, unspecified, unspecified site: Secondary | ICD-10-CM

## 2011-10-08 DIAGNOSIS — C8589 Other specified types of non-Hodgkin lymphoma, extranodal and solid organ sites: Secondary | ICD-10-CM

## 2011-10-08 LAB — CBC WITH DIFFERENTIAL/PLATELET
Basophils Absolute: 0.1 10*3/uL (ref 0.0–0.1)
EOS%: 4.9 % (ref 0.0–7.0)
Eosinophils Absolute: 0.3 10*3/uL (ref 0.0–0.5)
HCT: 36.7 % (ref 34.8–46.6)
HGB: 12.2 g/dL (ref 11.6–15.9)
MCH: 29.8 pg (ref 25.1–34.0)
MCV: 89.5 fL (ref 79.5–101.0)
MONO%: 8.8 % (ref 0.0–14.0)
NEUT#: 4 10*3/uL (ref 1.5–6.5)
NEUT%: 71.1 % (ref 38.4–76.8)
lymph#: 0.8 10*3/uL — ABNORMAL LOW (ref 0.9–3.3)

## 2011-10-08 LAB — CHCC SMEAR

## 2011-10-08 NOTE — Progress Notes (Signed)
Bone marrow biopsy and aspiration.  INDICATION:  Recurrent follicular lymphoma; staging bone marrow biopsy before salvage chemo and bone marrow transplant.   Procedure: After obtained from consent, Victoria Wood was placed in the prone position. Time out was performed verifying correct patient and procedure. The skin overlying the left posterior crest was prepped with Betadine and draped in the usual sterile fashion. The skin and periosteum were infiltrated with 10 mL of 2% lidocaine x 2 as she was still uncomfortable after the first 10mL. A small puncture wound was made with #11 scalpel blade.  Bone marrow aspirate was obtained on the first pass of the aspiration needle.  One core biopsy was obtained through the same incision.   The aspirate was sent for routine histology, flow cytometry, and cytogenetics.  Core biopsy was sent for routine histology.   Victoria Wood tolerated procedure well with minimal  blood loss and without immediate complication.   A sterile dressing was applied.   Jethro Bolus M.D. 10/08/2011

## 2011-10-08 NOTE — Progress Notes (Signed)
VSS reviewed with pt to keep dressing on x24hr. Check for any drainage, if drainage coming through dressing to reinforce w/gauze and keep dressing on and call Dr on call or Desk nurse to advise staff. Pt verblaized understanding. Dressing is clean, dry intact. Pt d/c ambulatory, family member is waiting in front to take pt home.

## 2011-10-08 NOTE — Patient Instructions (Signed)
Pt to call with concerns. Reviewed to keep dressing on x24hr.

## 2011-10-08 NOTE — Progress Notes (Signed)
Pt arrived to Procedure Room at 8:10am.  Accompanied by her daughter.  Pt in stable condition, denies any pain, no acute distress.  Pt verbalized understanding of BMBX procedure and denied any questions.  Pt tolerated procedure well, performed by Dr. Gaylyn Rong.   Instructed pt on laying on site x 30 minutes then will be d/c'd home by Infusion RN.Marland Kitchen  Instructed on laying on site x 30 min if any bleeding and to call if bleeding does not stop after d/c today.  Pt verbalized understanding.

## 2011-10-09 ENCOUNTER — Other Ambulatory Visit: Payer: Self-pay | Admitting: *Deleted

## 2011-10-09 ENCOUNTER — Ambulatory Visit (HOSPITAL_BASED_OUTPATIENT_CLINIC_OR_DEPARTMENT_OTHER): Payer: BC Managed Care – PPO

## 2011-10-09 ENCOUNTER — Telehealth: Payer: Self-pay | Admitting: Oncology

## 2011-10-09 ENCOUNTER — Ambulatory Visit: Payer: BC Managed Care – PPO | Admitting: Oncology

## 2011-10-09 ENCOUNTER — Ambulatory Visit (HOSPITAL_BASED_OUTPATIENT_CLINIC_OR_DEPARTMENT_OTHER): Payer: BC Managed Care – PPO | Admitting: Oncology

## 2011-10-09 VITALS — BP 94/61 | HR 89 | Temp 97.0°F | Ht 66.0 in | Wt 152.9 lb

## 2011-10-09 VITALS — BP 89/52 | HR 110 | Temp 99.5°F

## 2011-10-09 DIAGNOSIS — Z5112 Encounter for antineoplastic immunotherapy: Secondary | ICD-10-CM

## 2011-10-09 DIAGNOSIS — C8590 Non-Hodgkin lymphoma, unspecified, unspecified site: Secondary | ICD-10-CM

## 2011-10-09 DIAGNOSIS — C8589 Other specified types of non-Hodgkin lymphoma, extranodal and solid organ sites: Secondary | ICD-10-CM

## 2011-10-09 DIAGNOSIS — R509 Fever, unspecified: Secondary | ICD-10-CM

## 2011-10-09 LAB — URINALYSIS, MICROSCOPIC - CHCC
Ketones: NEGATIVE mg/dL
Nitrite: NEGATIVE
Protein: NEGATIVE mg/dL

## 2011-10-09 MED ORDER — ALLOPURINOL 300 MG PO TABS: 300.0000 mg | ORAL_TABLET | Freq: Every day | ORAL | Status: DC

## 2011-10-09 MED ORDER — SODIUM CHLORIDE 0.9 % IV SOLN
Freq: Once | INTRAVENOUS | Status: AC | PRN
  Administered 2011-10-09: 11:00:00 via INTRAVENOUS

## 2011-10-09 MED ORDER — LIDOCAINE-PRILOCAINE 2.5-2.5 % EX CREA: TOPICAL_CREAM | CUTANEOUS | Status: DC | PRN

## 2011-10-09 MED ORDER — ONDANSETRON 8 MG/50ML IVPB (CHCC)
8.0000 mg | Freq: Once | INTRAVENOUS | Status: AC
  Administered 2011-10-09: 8 mg via INTRAVENOUS

## 2011-10-09 MED ORDER — SODIUM CHLORIDE 0.9 % IV SOLN
Freq: Once | INTRAVENOUS | Status: AC
  Administered 2011-10-09: 09:00:00 via INTRAVENOUS

## 2011-10-09 MED ORDER — DEXAMETHASONE SODIUM PHOSPHATE 10 MG/ML IJ SOLN
10.0000 mg | Freq: Once | INTRAMUSCULAR | Status: AC
  Administered 2011-10-09: 10 mg via INTRAVENOUS

## 2011-10-09 MED ORDER — ONDANSETRON HCL 8 MG PO TABS: ORAL_TABLET | ORAL | Status: DC

## 2011-10-09 MED ORDER — DIPHENHYDRAMINE HCL 25 MG PO CAPS
50.0000 mg | ORAL_CAPSULE | Freq: Once | ORAL | Status: AC
  Administered 2011-10-09: 50 mg via ORAL

## 2011-10-09 MED ORDER — ACETAMINOPHEN 325 MG PO TABS
650.0000 mg | ORAL_TABLET | Freq: Once | ORAL | Status: AC
  Administered 2011-10-09: 650 mg via ORAL

## 2011-10-09 MED ORDER — ACYCLOVIR 400 MG PO TABS: 400.0000 mg | ORAL_TABLET | Freq: Every day | ORAL | Status: DC

## 2011-10-09 MED ORDER — SODIUM CHLORIDE 0.9 % IV SOLN
375.0000 mg/m2 | Freq: Once | INTRAVENOUS | Status: AC
  Administered 2011-10-09: 700 mg via INTRAVENOUS
  Filled 2011-10-09: qty 70

## 2011-10-09 MED ORDER — PROCHLORPERAZINE MALEATE 10 MG PO TABS: 10.0000 mg | ORAL_TABLET | Freq: Four times a day (QID) | ORAL | Status: DC | PRN

## 2011-10-09 MED ORDER — FAMOTIDINE IN NACL 20-0.9 MG/50ML-% IV SOLN
20.0000 mg | Freq: Two times a day (BID) | INTRAVENOUS | Status: DC
  Administered 2011-10-09: 20 mg via INTRAVENOUS

## 2011-10-09 MED ORDER — SODIUM CHLORIDE 0.9 % IV SOLN
90.0000 mg/m2 | Freq: Once | INTRAVENOUS | Status: AC
  Administered 2011-10-09: 160 mg via INTRAVENOUS
  Filled 2011-10-09: qty 32

## 2011-10-09 MED ORDER — DIPHENHYDRAMINE HCL 50 MG/ML IJ SOLN
25.0000 mg | Freq: Once | INTRAMUSCULAR | Status: AC | PRN
  Administered 2011-10-09: 25 mg via INTRAVENOUS

## 2011-10-09 NOTE — Telephone Encounter (Signed)
Per dr Gaylyn Rong if pt is not through w/ tx today then go 1/31 for cxr.  Victoria Wood will have tx nurse advise the pt  aom

## 2011-10-09 NOTE — Progress Notes (Signed)
At 1040, patient started to have signs/symptoms of reaction to Rituxan.  Dose infusing at the time of reaction was 200mg /hr.  Patient began rigors and complained of neck pain.  Dr. Gaylyn Rong made aware.  Orders administered per MAR included Tylenol, Pepcid, Benadryl, and NS for 30 minutes.  Tylenol was not given prior to the initial administration to Rituxan as patient took 650mg  at home just immediately before her scheduled appointments.  650mg  Tylenol was given, however, at the onset of reaction.  Patient did not have complaints of any SOB.  At 1144- Rituxan restarted at 100mg /hr dose.  After 30 minutes, VSS, patient has no complaints.  Dose increased to 150mg /hr.  Patient was asymptomatic, but developed a low-grade fever and decreased blood pressure.  Dr. Gaylyn Rong made aware, and instructed to continue with Rituxan at the 150mg /hr rate.  Blood cultures and urine cultures obtained.

## 2011-10-09 NOTE — Patient Instructions (Signed)
[facility] Discharge Instructions for Patients Receiving Chemotherapy  Today you received the following chemotherapy agents Rituxan and  Treanda.   To help prevent nausea and vomiting after your treatment, we encourage you to take your nausea medication; Prochloraperzine and Ondansetron.  Begin taking it for any nausea or vomiting and take it as often as prescribed.   If you develop nausea and vomiting that is not controlled by your nausea medication, call the clinic. If it is after clinic hours your family physician or the after hours number for the clinic or go to the Emergency Department.   BELOW ARE SYMPTOMS THAT SHOULD BE REPORTED IMMEDIATELY:  *FEVER GREATER THAN 100.5 F  *CHILLS WITH OR WITHOUT FEVER  NAUSEA AND VOMITING THAT IS NOT CONTROLLED WITH YOUR NAUSEA MEDICATION  *UNUSUAL SHORTNESS OF BREATH  *UNUSUAL BRUISING OR BLEEDING  TENDERNESS IN MOUTH AND THROAT WITH OR WITHOUT PRESENCE OF ULCERS  *URINARY PROBLEMS  *BOWEL PROBLEMS  UNUSUAL RASH Items with * indicate a potential emergency and should be followed up as soon as possible.  One of the nurses will contact you 24 hours after your treatment (you will be here tomorrow). Please let the nurse know about any problems that you may have experienced. Feel free to call the clinic you have any questions or concerns. The clinic phone number is [phone].   I have been informed and understand all the instructions given to me. I know to contact the clinic, my physician, or go to the Emergency Department if any problems should occur. I do not have any questions at this time, but understand that I may call the clinic during office hours   should I have any questions or need assistance in obtaining follow up care.    __________________________________________  _____________  __________ Signature of Patient or Authorized Representative            Date                    Time    __________________________________________ Nurse's Signature

## 2011-10-09 NOTE — Progress Notes (Signed)
[facility] OFFICE PROGRESS NOTE  Cc:  No primary provider on file.  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   PAST THERAPY:  She was diagnosed with non-Hodgkin lymphoma (Follicular Stage IV) when she was 61 year old. She was successfully treated with chemotherapy and remained disease free for 29 years. She does not remember exact chemoregimen since that oncology practice is no longer in service. In 2008, she was found to have recurrent stage IV follicular disease which she underwent chemotherapy (FND-Fludarabine/Mitoxantrone/Dex?) and was in remission. Since then, Dr. Parks Ranger [phone]) has been following her progression with biannually PET scan with evidence of progressive disease. In June 2012, she had small shotty adenopathy in the neck, chest, and abdomen. Decision was for watchful observation. In October 2012, her B symptoms came back and after evaluated and confirmed by Dr. Parks Ranger with progressive disease, she received single agent weekly Rituximab x 5 doses with last dose on11/21/2012.  She has since transferred her care to [place], [place].    CURRENT THERAPY: here to start Bendamustine/Rituxan before R-ICE chemo prior to autologous BMT.   INTERVAL HISTORY: Victoria Wood 61 y.o. female returns to go over last minutes issues and answers question for the husband who is here for the first time.  She still has right groin pain and RLE swelling without calf/thigh pain.  She has vague, mild midepigastric dyscomfort.  She has mild nausea but no vomiting.  She has been able to tolerate foods better and has slight weight gain.  She has night sweat and mild fatigue; however, she is still independent of all activities of daily living.  She denies jaundice, change in color of urine/stool.  Patient denies headache, visual  changes, confusion, drenching night sweats, mucositis, odynophagia, dysphagia, nausea vomiting, jaundice, chest pain, palpitation, shortness of breath, dyspnea on exertion, productive cough, gum bleeding, epistaxis, hematemesis, hemoptysis, abdominal swelling, early satiety, melena,  hematuria, skin rash, spontaneous bleeding, joint swelling, joint pain, heat or cold intolerance, bowel bladder incontinence, back pain, focal motor weakness, paresthesia, depression, suicidal or homocidal ideation, feeling hopelessness.   MEDICAL HISTORY: Past Medical History  Diagnosis Date  . Gastric polyps     History of  . GERD (gastroesophageal reflux disease)   . HTN (hypertension)   . DM type 2 (diabetes mellitus, type 2)   . Asthma   . Non Hodgkin's lymphoma   . Cancer     hx of Rituxan in 07/2011  . Non-Hodgkin's lymphoma   . Anemia   . Blood transfusion   . Hyperlipidemia   . Complication of anesthesia     has woken up during surgery   . Pneumonia     hx of pneumonia     SURGICAL HISTORY:  Past Surgical History  Procedure Date  . Portacath placement   . Upper gastrointestinal endoscopy 02/2011    gastric polyps x 2  . Neck and thigh surgery     lymph nodes removed  . Esophagogastroduodenoscopy 08/22/2011    Procedure: ESOPHAGOGASTRODUODENOSCOPY (EGD);  Surgeon: Louis Meckel, MD;  Location: [facility];  Service: Endoscopy;  Laterality: N/A;    MEDICATIONS: Current Outpatient Prescriptions  Medication Sig Dispense Refill  . acetaminophen (TYLENOL) 325 MG tablet Take 325-650 mg by mouth every 6 (six) hours as needed. Pain       . acyclovir (ZOVIRAX) 400 MG tablet Take  1 tablet (400 mg total) by mouth daily.  30 tablet  3  . albuterol (PROVENTIL HFA;VENTOLIN HFA) 108 (90 BASE) MCG/ACT inhaler Inhale 2 puffs into the lungs every 6 (six) hours as needed. Wheezing       . allopurinol (ZYLOPRIM) 300 MG tablet Take 1 tablet (300 mg total) by mouth daily.  30 tablet  3  . ALPRAZolam  (XANAX) 0.25 MG tablet Take 0.25 mg by mouth 2 (two) times daily as needed. anxiety      . Cyanocobalamin (VITAMIN B 12 PO) Take 1 tablet by mouth daily.       Marland Kitchen HYDROcodone-acetaminophen (VICODIN) 5-500 MG per tablet Take 1 tablet by mouth every 6 (six) hours as needed. Pain       . hydroxypropyl methylcellulose (ISOPTO TEARS) 2.5 % ophthalmic solution Place 1 drop into both eyes daily.      . metFORMIN (GLUCOPHAGE) 500 MG tablet Take 500 mg by mouth 2 (two) times daily as needed.       . ondansetron (ZOFRAN) 8 MG tablet Take 1 tablet two times a day starting the day after chemo for 2 days. Then take 1 tablet two times a day as needed for nausea or vomiting.  30 tablet  1  . oxycodone (OXY-IR) 5 MG capsule Take 5 mg by mouth every 4 (four) hours as needed. Pain       . PRILOSEC OTC 20 MG tablet Take 20 mg by mouth 2 (two) times daily.       . prochlorperazine (COMPAZINE) 10 MG tablet Take 1 tablet (10 mg total) by mouth every 6 (six) hours as needed (Nausea or vomiting).  30 tablet  1  . ramipril (ALTACE) 10 MG tablet Take 20 mg by mouth daily after breakfast.       . senna (SENOKOT) 8.6 MG TABS Take 2 tablets by mouth daily as needed. Constipation       . venlafaxine (EFFEXOR-XR) 75 MG 24 hr capsule Take 75 mg by mouth daily after breakfast.        No current facility-administered medications for this visit.   Facility-Administered Medications Ordered in Other Visits  Medication Dose Route Frequency Provider Last Rate Last Dose  . 0.9 %  sodium chloride infusion   Intravenous Once Jethro Bolus, MD 20 mL/hr at 10/09/11 0900    . acetaminophen (TYLENOL) tablet 650 mg  650 mg Oral Once Jethro Bolus, MD      . bendamustine (TREANDA) 160 mg in sodium chloride 0.9 % 500 mL chemo infusion  90 mg/m2 (Treatment Plan Actual) Intravenous Once Jethro Bolus, MD      . dexamethasone (DECADRON) injection 10 mg  10 mg Intravenous Once Jethro Bolus, MD      . diphenhydrAMINE (BENADRYL) capsule 50 mg  50 mg Oral Once Jethro Bolus,  MD   50 mg at 10/09/11 0915  . ondansetron (ZOFRAN) IVPB 8 mg  8 mg Intravenous Once Jethro Bolus, MD      . riTUXimab (RITUXAN) 700 mg in sodium chloride 0.9 % 250 mL chemo infusion  375 mg/m2 (Treatment Plan Actual) Intravenous Once Jethro Bolus, MD        ALLERGIES:  is allergic to penicillins and shellfish allergy.  REVIEW OF SYSTEMS:  The rest of the 14-point review of system was negative.   Filed Vitals:   10/09/11 0812  BP: 94/61  Pulse: 89  Temp: 97 F (36.1 C)   Wt Readings from Last 3 Encounters:  10/09/11 152 lb  14.4 oz (69.355 kg)  10/04/11 151 lb 11.2 oz (68.811 kg)  09/30/11 151 lb 11.2 oz (68.811 kg)   ECOG Performance status: 1  PHYSICAL EXAMINATION:  General: thin-appearing woman in no acute distress.  Eyes:  no scleral icterus.  ENT:  There were no oropharyngeal lesions.  Neck was without thyromegaly.  Lymphatics:  There was extensive cervical, axillary, bilateral inguinal adenopathy.  Respiratory: lungs were clear bilaterally without wheezing or crackles.  Cardiovascular:  Regular rate and rhythm, S1/S2, without murmur, rub or gallop.  There was right leg edema.  GI:  abdomen was soft, flat, nontender, nondistended, without organomegaly. I could feel abdominal masses.   Muscoloskeletal:  no spinal tenderness of palpation of vertebral spine.  Skin exam was without echymosis, petichae.  Neuro exam was nonfocal.  Patient was able to get on and off exam table without assistance.  Gait was normal.  Patient was alerted and oriented.  Attention was good.   Language was appropriate.  Mood was normal without depression.  Speech was not pressured.  Thought content was not tangential.     LABORATORY/RADIOLOGY DATA:  Lab Results  Component Value Date   WBC 5.7 10/08/2011   HGB 12.2 10/08/2011   HCT 36.7 10/08/2011   PLT 196 10/08/2011   GLUCOSE 127* 10/08/2011   ALT 8 10/08/2011   AST 13 10/08/2011   NA 141 10/08/2011   K 4.5 10/08/2011   CL 104 10/08/2011   CREATININE 0.90 10/08/2011    BUN 16 10/08/2011   CO2 29 10/08/2011   HGBA1C 5.8* 09/10/2011     ASSESSMENT AND PLAN:   1.  GERD:  She is on Prilosec.  2.  HTN:  She is on Ramipril.  3.  DM, type II:  She is on Metformin.  4.  Depression, anxiety:  She is on Effexor XR.  5.  Intermittent cholestatis:  From #5 below.  Recent work up did not show evidence of biliary disease.  6.  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    Staging:  Stage IV given splenic involvement.  Bone marrow biopsy was done yesterday.  Result pending.  I showed the patient, daughter, husband again the images of the PET scan showing how extensive her disease was.  She has stage IV disease given splenic involvement.  She has symptoms with abdominal pain, B symptoms, intermittent cholestasis.  She needs salvage therapy for this 2nd recurrence of her dz.  Again, I went over the plan as discussed last week for Bendamustine/Rituxan d1,d2 q4wks for 3 cycles and followed up with PET scan and possibly autologous BMT if she has good response to Bendamustine/Rituxan chemo.  I again went over side effects of this regimen which include but not limited to hair thinning, fever, cytopenia, infection, bleeding, skin rash, infusion reaction.  The chance of response is about 80%.  Patient and family expressed informed understanding and wished to proceed with chemo today.  She also has Neulasta on day 3 to decrease the risk of neutropenic fever.  I again urged her to pick up Allopurinol and Acyclovir to prevent tumor lysis syndrome and herpes reactivation; respectively.  She has Zofran/Compazine prn nausea/vomiting.    For follow up, she has lab only appointment in 1 wk for tumor lysis syndrome lab.  I'll see her in 2 wks midcycle.    The length of time of the face-to-face encounter was 20 minutes. More than 50% of time was spent counseling and  coordination of care.

## 2011-10-10 ENCOUNTER — Ambulatory Visit (HOSPITAL_COMMUNITY)
Admission: RE | Admit: 2011-10-10 | Discharge: 2011-10-10 | Disposition: A | Discharge status: Home / Self Care | Payer: BC Managed Care – PPO | Source: Ambulatory Visit | Attending: Oncology | Admitting: Oncology

## 2011-10-10 ENCOUNTER — Ambulatory Visit (HOSPITAL_BASED_OUTPATIENT_CLINIC_OR_DEPARTMENT_OTHER): Payer: BC Managed Care – PPO

## 2011-10-10 VITALS — BP 122/69 | HR 86 | Temp 97.2°F

## 2011-10-10 DIAGNOSIS — R509 Fever, unspecified: Secondary | ICD-10-CM | POA: Insufficient documentation

## 2011-10-10 DIAGNOSIS — C8589 Other specified types of non-Hodgkin lymphoma, extranodal and solid organ sites: Secondary | ICD-10-CM

## 2011-10-10 DIAGNOSIS — R599 Enlarged lymph nodes, unspecified: Secondary | ICD-10-CM | POA: Insufficient documentation

## 2011-10-10 DIAGNOSIS — C8590 Non-Hodgkin lymphoma, unspecified, unspecified site: Secondary | ICD-10-CM

## 2011-10-10 DIAGNOSIS — Z5112 Encounter for antineoplastic immunotherapy: Secondary | ICD-10-CM

## 2011-10-10 LAB — COMPREHENSIVE METABOLIC PANEL
AST: 13 U/L (ref 0–37)
Albumin: 4.2 g/dL (ref 3.5–5.2)
BUN: 16 mg/dL (ref 6–23)
Calcium: 9.7 mg/dL (ref 8.4–10.5)
Chloride: 104 mEq/L (ref 96–112)
Creatinine, Ser: 0.9 mg/dL (ref 0.50–1.10)
Glucose, Bld: 127 mg/dL — ABNORMAL HIGH (ref 70–99)
Potassium: 4.5 mEq/L (ref 3.5–5.3)

## 2011-10-10 LAB — BETA 2 MICROGLOBULIN, SERUM: Beta-2 Microglobulin: 3.86 mg/L — ABNORMAL HIGH (ref 1.01–1.73)

## 2011-10-10 LAB — URIC ACID: Uric Acid, Serum: 6.9 mg/dL (ref 2.4–7.0)

## 2011-10-10 LAB — LACTATE DEHYDROGENASE: LDH: 132 U/L (ref 94–250)

## 2011-10-10 MED ORDER — SODIUM CHLORIDE 0.9 % IV SOLN
Freq: Once | INTRAVENOUS | Status: AC
  Administered 2011-10-10: 09:00:00 via INTRAVENOUS

## 2011-10-10 MED ORDER — ONDANSETRON 8 MG/50ML IVPB (CHCC)
8.0000 mg | Freq: Once | INTRAVENOUS | Status: AC
  Administered 2011-10-10: 8 mg via INTRAVENOUS

## 2011-10-10 MED ORDER — SODIUM CHLORIDE 0.9 % IV SOLN
90.0000 mg/m2 | Freq: Once | INTRAVENOUS | Status: AC
  Administered 2011-10-10: 160 mg via INTRAVENOUS
  Filled 2011-10-10: qty 32

## 2011-10-10 MED ORDER — DEXAMETHASONE SODIUM PHOSPHATE 10 MG/ML IJ SOLN
10.0000 mg | Freq: Once | INTRAMUSCULAR | Status: AC
  Administered 2011-10-10: 10 mg via INTRAVENOUS

## 2011-10-11 ENCOUNTER — Encounter (INDEPENDENT_AMBULATORY_CARE_PROVIDER_SITE_OTHER): Payer: Self-pay | Admitting: Surgery

## 2011-10-11 ENCOUNTER — Ambulatory Visit (INDEPENDENT_AMBULATORY_CARE_PROVIDER_SITE_OTHER): Payer: BC Managed Care – PPO | Admitting: Surgery

## 2011-10-11 ENCOUNTER — Ambulatory Visit (HOSPITAL_BASED_OUTPATIENT_CLINIC_OR_DEPARTMENT_OTHER): Payer: BC Managed Care – PPO

## 2011-10-11 VITALS — BP 142/76 | HR 106 | Temp 97.8°F | Resp 16 | Ht 66.0 in | Wt 158.0 lb

## 2011-10-11 VITALS — BP 130/62 | HR 66 | Temp 98.8°F

## 2011-10-11 DIAGNOSIS — C8590 Non-Hodgkin lymphoma, unspecified, unspecified site: Secondary | ICD-10-CM

## 2011-10-11 DIAGNOSIS — Z5189 Encounter for other specified aftercare: Secondary | ICD-10-CM

## 2011-10-11 DIAGNOSIS — C8589 Other specified types of non-Hodgkin lymphoma, extranodal and solid organ sites: Secondary | ICD-10-CM

## 2011-10-11 LAB — URINE CULTURE

## 2011-10-11 MED ORDER — PEGFILGRASTIM INJECTION 6 MG/0.6ML
6.0000 mg | Freq: Once | SUBCUTANEOUS | Status: AC
  Administered 2011-10-11: 6 mg via SUBCUTANEOUS

## 2011-10-11 NOTE — Progress Notes (Signed)
This patient is s/p left axillary lymph node biopsy on 10/02/11 by Dr. Donell Beers.  Over the last couple of days, she has developed significant swelling and pain under her incision in the armpit.  She presents to urgent office for evaluation as Dr. Donell Beers is not available.    Filed Vitals:   10/11/11 1443  BP: 142/76  Pulse: 106  Temp: 97.8 F (36.6 C)  Resp: 16   Left axilla - incision healing well with no sign of infection; obvious fluid collection under the incision.  We prepped this alcohol and anesthetized with 1% Lidocaine.  Using an 18 gauge needle, we aspirated over 50 ml of clear yellow seroma fluid with immediate decompression of the fluid collection.  The patient will keep the dressing over this site for 24 hours.  I asked her to limit the activity with that arm for a few days.    She has a follow-up appointment with Dr.Byerly in 2 weeks.  She is to call our office sooner if the fluid seems to reaccumulate.  It may need to be aspirated again.  Wilmon Arms. Corliss Skains, MD, [facility]  10/11/2011 3:07 PM

## 2011-10-11 NOTE — Patient Instructions (Signed)
Keep your follow-up appointment with Dr. Donell Beers as scheduled.  Call us sooner if the fluid seems to reaccumulate.

## 2011-10-15 LAB — CULTURE, BLOOD (SINGLE)

## 2011-10-17 ENCOUNTER — Other Ambulatory Visit: Payer: Self-pay | Admitting: Oncology

## 2011-10-17 ENCOUNTER — Other Ambulatory Visit: Payer: Self-pay | Admitting: *Deleted

## 2011-10-17 ENCOUNTER — Telehealth: Payer: Self-pay | Admitting: *Deleted

## 2011-10-17 ENCOUNTER — Other Ambulatory Visit (HOSPITAL_BASED_OUTPATIENT_CLINIC_OR_DEPARTMENT_OTHER): Payer: BC Managed Care – PPO | Admitting: Lab

## 2011-10-17 DIAGNOSIS — C8590 Non-Hodgkin lymphoma, unspecified, unspecified site: Secondary | ICD-10-CM

## 2011-10-17 DIAGNOSIS — C8589 Other specified types of non-Hodgkin lymphoma, extranodal and solid organ sites: Secondary | ICD-10-CM

## 2011-10-17 LAB — COMPREHENSIVE METABOLIC PANEL
AST: 14 U/L (ref 0–37)
Albumin: 4.3 g/dL (ref 3.5–5.2)
Alkaline Phosphatase: 117 U/L (ref 39–117)
BUN: 12 mg/dL (ref 6–23)
Potassium: 3.5 mEq/L (ref 3.5–5.3)
Total Bilirubin: 0.6 mg/dL (ref 0.3–1.2)

## 2011-10-17 LAB — CBC WITH DIFFERENTIAL/PLATELET
Basophils Absolute: 0.1 10*3/uL (ref 0.0–0.1)
EOS%: 2.2 % (ref 0.0–7.0)
HGB: 12 g/dL (ref 11.6–15.9)
MCH: 29.6 pg (ref 25.1–34.0)
MCV: 89.6 fL (ref 79.5–101.0)
MONO%: 3.6 % (ref 0.0–14.0)
RBC: 4.05 10*6/uL (ref 3.70–5.45)
RDW: 13.8 % (ref 11.2–14.5)

## 2011-10-17 NOTE — Telephone Encounter (Signed)
Message copied by Wende Mott on Thu Oct 17, 2011  4:41 PM ------      Message from: HA, Raliegh Ip T      Created: Thu Oct 17, 2011  4:32 PM       Please call pt.  Her labs look good. There is no evidence of tumor lysis syndrome.  Thanks.

## 2011-10-17 NOTE — Telephone Encounter (Signed)
Could not reach pt herself, no answer and no voicemail.  Called her husband instead and informed of labs good today and no evidence of tumor lysis syndrome per Dr. Gaylyn Rong.  Reviewed upcoming schedule and husband states he thought pt to see Dr. Gaylyn Rong mid way between chemo treatments.  Per Dr. Gaylyn Rong he ordered this, but he will order it again for scheduling to schedule.  Informed husband to call on Monday if they have not heard of appt by then.  He verbalized understanding.

## 2011-10-18 ENCOUNTER — Telehealth: Payer: Self-pay | Admitting: Oncology

## 2011-10-18 NOTE — Telephone Encounter (Signed)
called pt and inform her of appt for 02/12

## 2011-10-21 ENCOUNTER — Telehealth: Payer: Self-pay | Admitting: *Deleted

## 2011-10-21 NOTE — Telephone Encounter (Signed)
Husband called to confirm pt's appts.

## 2011-10-22 ENCOUNTER — Ambulatory Visit (HOSPITAL_BASED_OUTPATIENT_CLINIC_OR_DEPARTMENT_OTHER): Payer: BC Managed Care – PPO | Admitting: Oncology

## 2011-10-22 ENCOUNTER — Other Ambulatory Visit: Payer: BC Managed Care – PPO | Admitting: Lab

## 2011-10-22 VITALS — BP 125/77 | HR 86 | Temp 98.3°F | Ht 66.0 in | Wt 141.6 lb

## 2011-10-22 DIAGNOSIS — C8590 Non-Hodgkin lymphoma, unspecified, unspecified site: Secondary | ICD-10-CM

## 2011-10-22 DIAGNOSIS — C8589 Other specified types of non-Hodgkin lymphoma, extranodal and solid organ sites: Secondary | ICD-10-CM

## 2011-10-22 LAB — COMPREHENSIVE METABOLIC PANEL
BUN: 12 mg/dL (ref 6–23)
CO2: 29 mEq/L (ref 19–32)
Calcium: 10.5 mg/dL (ref 8.4–10.5)
Creatinine, Ser: 0.78 mg/dL (ref 0.50–1.10)
Glucose, Bld: 96 mg/dL (ref 70–99)
Total Bilirubin: 0.7 mg/dL (ref 0.3–1.2)

## 2011-10-22 LAB — CBC WITH DIFFERENTIAL/PLATELET
Basophils Absolute: 0.1 10*3/uL (ref 0.0–0.1)
Eosinophils Absolute: 0.3 10*3/uL (ref 0.0–0.5)
HCT: 38.6 % (ref 34.8–46.6)
HGB: 12.9 g/dL (ref 11.6–15.9)
LYMPH%: 16.6 % (ref 14.0–49.7)
MCHC: 33.4 g/dL (ref 31.5–36.0)
MONO#: 0.5 10*3/uL (ref 0.1–0.9)
NEUT#: 7.3 10*3/uL — ABNORMAL HIGH (ref 1.5–6.5)
NEUT%: 75.2 % (ref 38.4–76.8)
Platelets: 235 10*3/uL (ref 145–400)
WBC: 9.8 10*3/uL (ref 3.9–10.3)

## 2011-10-22 LAB — URIC ACID: Uric Acid, Serum: 4 mg/dL (ref 2.4–7.0)

## 2011-10-22 LAB — LACTATE DEHYDROGENASE: LDH: 149 U/L (ref 94–250)

## 2011-10-22 NOTE — Progress Notes (Signed)
[facility] OFFICE PROGRESS NOTE  Cc:  No primary provider on file.  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 She was successfully treated with chemotherapy and remained disease free for 29 years. She does not remember exact chemoregimen since that oncology practice is no longer in service. In 2008, she was found to have recurrent stage IV follicular disease which she underwent chemotherapy (FND-Fludarabine/Mitoxantrone/Dex?) and was in remission. Since then, Dr. Parks Ranger [phone]) has been following her progression with biannually PET scan with evidence of progressive disease. In June 2012, she had small shotty adenopathy in the neck, chest, and abdomen. Decision was for watchful observation. In October 2012, her B symptoms came back and after evaluated and confirmed by Dr. Parks Ranger with progressive disease, she received single agent weekly Rituximab x 5 doses with last dose on11/21/2012.  She has since transferred her care to [place], [place].    CURRENT THERAPY: started Bendamustine/Rituxan on 10/09/2011 before R-ICE chemo prior to autologous BMT.   INTERVAL HISTORY: Victoria Wood 61 y.o. female returns to clinic today with her daughter as mid cycle chemo check.  She reported that her right leg swelling has resolved about 1 week after chemo.  Her abdominal pain has also resolved. She has not been taking any more pain med.  She has some skin rash in the back but no where else.  She had left axillary swelling a few days after excisional biopsy.  She was seen by Surgery and has aspiration.  The swelling has resolved since then.  She had some fatigue about a week after chemo; she was still independent of all activities of daily living.  She has completely recovered  now.  She thinks that her strength has quite improved compared to prior to chemo.     MEDICAL HISTORY: Past Medical History  Diagnosis Date  . Gastric polyps     History of  . GERD (gastroesophageal reflux disease)   . HTN (hypertension)   . DM type 2 (diabetes mellitus, type 2)   . Asthma   . Non Hodgkin's lymphoma   . Cancer     hx of Rituxan in 07/2011  . Non-Hodgkin's lymphoma   . Anemia   . Blood transfusion   . Hyperlipidemia   . Complication of anesthesia     has woken up during surgery   . Pneumonia     hx of pneumonia     SURGICAL HISTORY:  Past Surgical History  Procedure Date  . Portacath placement   . Upper gastrointestinal endoscopy 02/2011    gastric polyps x 2  . Neck and thigh surgery     lymph nodes removed  . Esophagogastroduodenoscopy 08/22/2011    Procedure: ESOPHAGOGASTRODUODENOSCOPY (EGD);  Surgeon: Louis Meckel, MD;  Location: [facility];  Service: Endoscopy;  Laterality: N/A;  . Lymph node biopsy 10/02/11    left axillary    MEDICATIONS: Current Outpatient Prescriptions  Medication Sig Dispense Refill  . acetaminophen (TYLENOL) 325 MG tablet Take 325-650 mg by mouth every 6 (six) hours as needed. Pain       . acyclovir (ZOVIRAX) 400 MG tablet Take 1 tablet (400 mg total) by mouth daily.  30 tablet  3  . albuterol (PROVENTIL HFA;VENTOLIN HFA) 108 (90 BASE) MCG/ACT inhaler Inhale 2  puffs into the lungs every 6 (six) hours as needed. Wheezing       . allopurinol (ZYLOPRIM) 300 MG tablet Take 1 tablet (300 mg total) by mouth daily.  30 tablet  3  . ALPRAZolam (XANAX) 0.25 MG tablet Take 0.25 mg by mouth 2 (two) times daily as needed. anxiety      . HYDROcodone-acetaminophen (VICODIN) 5-500 MG per tablet Take 1 tablet by mouth every 6 (six) hours as needed. Pain       . hydroxypropyl methylcellulose (ISOPTO TEARS) 2.5 % ophthalmic solution Place 1 drop into both eyes daily.      Marland Kitchen lidocaine-prilocaine (EMLA) cream Apply topically as needed.   30 g  2  . metFORMIN (GLUCOPHAGE) 500 MG tablet Take 500 mg by mouth 2 (two) times daily as needed.       . ondansetron (ZOFRAN) 8 MG tablet Take 1 tablet two times a day starting the day after chemo for 2 days. Then take 1 tablet two times a day as needed for nausea or vomiting.  30 tablet  1  . oxycodone (OXY-IR) 5 MG capsule Take 5 mg by mouth every 4 (four) hours as needed. Pain       . PRILOSEC OTC 20 MG tablet Take 20 mg by mouth 2 (two) times daily.       . prochlorperazine (COMPAZINE) 10 MG tablet Take 1 tablet (10 mg total) by mouth every 6 (six) hours as needed (Nausea or vomiting).  30 tablet  1  . ramipril (ALTACE) 10 MG tablet Take 20 mg by mouth daily after breakfast.       . senna (SENOKOT) 8.6 MG TABS Take 2 tablets by mouth daily as needed. Constipation       . venlafaxine (EFFEXOR-XR) 75 MG 24 hr capsule Take 75 mg by mouth daily after breakfast.       . Cyanocobalamin (VITAMIN B 12 PO) Take 1 tablet by mouth daily.         ALLERGIES:  is allergic to penicillins and shellfish allergy.  REVIEW OF SYSTEMS:  The rest of the 14-point review of system was negative.   Filed Vitals:   10/22/11 1316  BP: 125/77  Pulse: 86  Temp: 98.3 F (36.8 C)   Wt Readings from Last 3 Encounters:  10/22/11 141 lb 9.6 oz (64.229 kg)  10/11/11 158 lb (71.668 kg)  10/09/11 152 lb 14.4 oz (69.355 kg)   ECOG Performance status: 1  PHYSICAL EXAMINATION:  General: thin-appearing woman in no acute distress.  Eyes:  no scleral icterus.  ENT:  There were no oropharyngeal lesions.  Neck was without thyromegaly.  Lymphatics:  I could not longer palpate extensive adenopathy.  There is some swelling in the left axilla but no frank adenopathy.   Respiratory: lungs were clear bilaterally without wheezing or crackles.  Cardiovascular:  Regular rate and rhythm, S1/S2, without murmur, rub or gallop.  The right leg edema has completely resolved.  GI:  abdomen was soft, flat, nontender, nondistended,  without organomegaly. I could feel abdominal masses.   Muscoloskeletal:  no spinal tenderness of palpation of vertebral spine.  Skin exam was without echymosis, petichae.  Neuro exam was nonfocal.  Patient was able to get on and off exam table without assistance.  Gait was normal.  Patient was alerted and oriented.  Attention was good.   Language was appropriate.  Mood was normal without depression.  Speech was not pressured.  Thought content was not tangential.  LABORATORY/RADIOLOGY DATA:  Lab Results  Component Value Date   WBC 9.8 10/22/2011   HGB 12.9 10/22/2011   HCT 38.6 10/22/2011   PLT 235 10/22/2011   GLUCOSE 122* 10/17/2011   ALT <8 10/17/2011   AST 14 10/17/2011   NA 142 10/17/2011   K 3.5 10/17/2011   CL 102 10/17/2011   CREATININE 0.84 10/17/2011   BUN 12 10/17/2011   CO2 30 10/17/2011   HGBA1C 5.8* 09/10/2011     ASSESSMENT AND PLAN:   1.  GERD:  She is on Prilosec.  This has significantly improved compared to prior to chemo.  2.  HTN:  She is on Ramipril.  3.  DM, type II:  She is on Metformin.  4.  Depression, anxiety:  She is on Effexor XR.  She is running out of this.  I advised her to contact her PCP who started this for refill.  5.  Intermittent cholestatis:  From #5 below.  Recent work up did not show evidence of biliary disease.  6.  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    She tolerated chemotherapy well the first cycle except for infusion reaction to Rituxan from which she recovered with slower infusion rate.  She has had very good clinical response with resolution of palpable adenopathy on exam today after only one cycle.  I advised her to continue Allopurinol to decrease the risk of tumor lysis syndrome.  There has been no sign of this so far.    I will see her in about 2 wks before the 2nd cycle of chemo.

## 2011-10-25 ENCOUNTER — Ambulatory Visit (INDEPENDENT_AMBULATORY_CARE_PROVIDER_SITE_OTHER): Payer: BC Managed Care – PPO | Admitting: General Surgery

## 2011-10-25 ENCOUNTER — Encounter (INDEPENDENT_AMBULATORY_CARE_PROVIDER_SITE_OTHER): Payer: Self-pay | Admitting: General Surgery

## 2011-10-25 VITALS — BP 120/78 | HR 92 | Temp 98.9°F | Resp 12 | Ht 66.0 in | Wt 140.0 lb

## 2011-10-25 DIAGNOSIS — C8589 Other specified types of non-Hodgkin lymphoma, extranodal and solid organ sites: Secondary | ICD-10-CM

## 2011-10-25 DIAGNOSIS — C8590 Non-Hodgkin lymphoma, unspecified, unspecified site: Secondary | ICD-10-CM

## 2011-10-25 NOTE — Assessment & Plan Note (Signed)
Pt on rituxan. No recurrence of seroma in L axilla. Going to get stem cell transplant at [facility]. May need second port.

## 2011-10-25 NOTE — Progress Notes (Signed)
HISTORY: Pt s/p left axillary lymph node biopsy for recurrent lymphoma.  She had seroma drainage last week by Dr. Corliss Skains.  Since that time, she has had no issues.  She does not notice any pain.     EXAM: L axillary incision without seroma or erythema.      Pathology reviewed and demonstrates: Non hodgkin's lymphoma, follicular variant.  ASSESSMENT AND PLAN:   Non-Hodgkin's lymphoma Pt on rituxan. No recurrence of seroma in L axilla. Going to get stem cell transplant at [facility]. May need second port.     Maudry Diego, MD Surgical Oncology, General & Endocrine Surgery [facility]  No primary provider on file. No ref. provider found

## 2011-10-25 NOTE — Patient Instructions (Signed)
Can return to normal activity.  I will contact Dr. Gaylyn Rong about second port. Our office will contact.

## 2011-11-05 ENCOUNTER — Encounter: Payer: Self-pay | Admitting: Oncology

## 2011-11-06 ENCOUNTER — Other Ambulatory Visit: Payer: BC Managed Care – PPO | Admitting: Lab

## 2011-11-06 ENCOUNTER — Ambulatory Visit (HOSPITAL_BASED_OUTPATIENT_CLINIC_OR_DEPARTMENT_OTHER): Payer: BC Managed Care – PPO | Admitting: Oncology

## 2011-11-06 ENCOUNTER — Ambulatory Visit (HOSPITAL_BASED_OUTPATIENT_CLINIC_OR_DEPARTMENT_OTHER): Payer: BC Managed Care – PPO

## 2011-11-06 VITALS — BP 117/68 | HR 88 | Temp 98.0°F

## 2011-11-06 VITALS — BP 110/62 | HR 49 | Temp 97.5°F | Ht 66.0 in | Wt 143.4 lb

## 2011-11-06 DIAGNOSIS — C8590 Non-Hodgkin lymphoma, unspecified, unspecified site: Secondary | ICD-10-CM

## 2011-11-06 DIAGNOSIS — C8589 Other specified types of non-Hodgkin lymphoma, extranodal and solid organ sites: Secondary | ICD-10-CM

## 2011-11-06 DIAGNOSIS — K219 Gastro-esophageal reflux disease without esophagitis: Secondary | ICD-10-CM

## 2011-11-06 DIAGNOSIS — Z5111 Encounter for antineoplastic chemotherapy: Secondary | ICD-10-CM

## 2011-11-06 DIAGNOSIS — I10 Essential (primary) hypertension: Secondary | ICD-10-CM

## 2011-11-06 DIAGNOSIS — E119 Type 2 diabetes mellitus without complications: Secondary | ICD-10-CM

## 2011-11-06 LAB — CBC WITH DIFFERENTIAL/PLATELET
Basophils Absolute: 0.1 10*3/uL (ref 0.0–0.1)
EOS%: 11.5 % — ABNORMAL HIGH (ref 0.0–7.0)
HCT: 37.5 % (ref 34.8–46.6)
HGB: 12.7 g/dL (ref 11.6–15.9)
LYMPH%: 25.2 % (ref 14.0–49.7)
MCH: 28.9 pg (ref 25.1–34.0)
MONO#: 0.3 10*3/uL (ref 0.1–0.9)
NEUT%: 54.6 % (ref 38.4–76.8)
Platelets: 166 10*3/uL (ref 145–400)
lymph#: 1.2 10*3/uL (ref 0.9–3.3)

## 2011-11-06 LAB — COMPREHENSIVE METABOLIC PANEL
BUN: 13 mg/dL (ref 6–23)
CO2: 29 mEq/L (ref 19–32)
Calcium: 9.5 mg/dL (ref 8.4–10.5)
Chloride: 104 mEq/L (ref 96–112)
Creatinine, Ser: 0.72 mg/dL (ref 0.50–1.10)
Total Bilirubin: 0.6 mg/dL (ref 0.3–1.2)

## 2011-11-06 LAB — LACTATE DEHYDROGENASE: LDH: 140 U/L (ref 94–250)

## 2011-11-06 MED ORDER — SODIUM CHLORIDE 0.9 % IV SOLN
90.0000 mg/m2 | Freq: Once | INTRAVENOUS | Status: AC
  Administered 2011-11-06: 160 mg via INTRAVENOUS
  Filled 2011-11-06: qty 32

## 2011-11-06 MED ORDER — ONDANSETRON 8 MG/50ML IVPB (CHCC)
8.0000 mg | Freq: Once | INTRAVENOUS | Status: AC
  Administered 2011-11-06: 8 mg via INTRAVENOUS

## 2011-11-06 MED ORDER — DEXAMETHASONE SODIUM PHOSPHATE 10 MG/ML IJ SOLN
10.0000 mg | Freq: Once | INTRAMUSCULAR | Status: AC
  Administered 2011-11-06: 10 mg via INTRAVENOUS

## 2011-11-06 MED ORDER — ACETAMINOPHEN 325 MG PO TABS
650.0000 mg | ORAL_TABLET | Freq: Once | ORAL | Status: AC
  Administered 2011-11-06: 650 mg via ORAL

## 2011-11-06 MED ORDER — HEPARIN SOD (PORK) LOCK FLUSH 100 UNIT/ML IV SOLN
500.0000 [IU] | Freq: Once | INTRAVENOUS | Status: AC | PRN
  Administered 2011-11-06: 500 [IU]
  Filled 2011-11-06: qty 5

## 2011-11-06 MED ORDER — SODIUM CHLORIDE 0.9 % IV SOLN
Freq: Once | INTRAVENOUS | Status: AC
  Administered 2011-11-06: 10:00:00 via INTRAVENOUS

## 2011-11-06 MED ORDER — DIPHENHYDRAMINE HCL 25 MG PO CAPS
50.0000 mg | ORAL_CAPSULE | Freq: Once | ORAL | Status: AC
  Administered 2011-11-06: 50 mg via ORAL

## 2011-11-06 MED ORDER — SODIUM CHLORIDE 0.9 % IV SOLN
375.0000 mg/m2 | Freq: Once | INTRAVENOUS | Status: AC
  Administered 2011-11-06: 50 mg via INTRAVENOUS
  Filled 2011-11-06: qty 70

## 2011-11-06 MED ORDER — SODIUM CHLORIDE 0.9 % IJ SOLN
10.0000 mL | INTRAMUSCULAR | Status: DC | PRN
  Administered 2011-11-06: 10 mL
  Filled 2011-11-06: qty 10

## 2011-11-06 MED ORDER — METHYLPREDNISOLONE SODIUM SUCC 125 MG IJ SOLR
125.0000 mg | Freq: Once | INTRAMUSCULAR | Status: AC
  Administered 2011-11-06: 125 mg via INTRAVENOUS

## 2011-11-06 NOTE — Patient Instructions (Signed)
Pt was stable at discharge via ambulation with husband.  Pt aware of chemo appt on 11/07/11.  Pt understood to pick up appt calendar on 11/07/11.

## 2011-11-06 NOTE — Progress Notes (Signed)
[facility] OFFICE PROGRESS NOTE  Cc:  No primary provider on file.  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 She was successfully treated with chemotherapy and remained disease free for 29 years. She does not remember exact chemoregimen since that oncology practice is no longer in service. In 2008, she was found to have recurrent stage IV follicular disease which she underwent chemotherapy (FND-Fludarabine/Mitoxantrone/Dex?) and was in remission. Since then, Dr. Parks Ranger [phone]) has been following her progression with biannually PET scan with evidence of progressive disease. In June 2012, she had small shotty adenopathy in the neck, chest, and abdomen. Decision was for watchful observation. In October 2012, her B symptoms came back and after evaluated and confirmed by Dr. Parks Ranger with progressive disease, she received single agent weekly Rituximab x 5 doses with last dose on11/21/2012.  She has since transferred her care to [place], [place].    CURRENT THERAPY: started Bendamustine/Rituxan on 10/09/2011 before R-ICE chemo prior to autologous BMT.   INTERVAL HISTORY: Victoria Wood 61 y.o. female returns to clinic today with her husband.  She reports doing very well.  She denies any more palpable node.  She no longer has nausea/vomiting, jaundice, abdominal pain requiring pain meds.  Her strength was lightly decreased the week after chemo; however, since then, she has recovered.  She thinks that her strength now is much better compared to before she started chemo here with [place].   Patient denies fatigue, headache, visual changes, confusion, drenching night sweats, palpable lymph node swelling, mucositis, odynophagia, dysphagia, nausea vomiting, jaundice, chest  pain, palpitation, shortness of breath, dyspnea on exertion, productive cough, gum bleeding, epistaxis, hematemesis, hemoptysis, abdominal pain, abdominal swelling, early satiety, melena, hematochezia, hematuria, skin rash, spontaneous bleeding, joint swelling, joint pain, heat or cold intolerance, bowel bladder incontinence, back pain, focal motor weakness, paresthesia, depression, suicidal or homocidal ideation, feeling hopelessness.     MEDICAL HISTORY: Past Medical History  Diagnosis Date  . Gastric polyps     History of  . GERD (gastroesophageal reflux disease)   . HTN (hypertension)   . DM type 2 (diabetes mellitus, type 2)   . Asthma   . Non Hodgkin's lymphoma   . Cancer     hx of Rituxan in 07/2011  . Non-Hodgkin's lymphoma   . Anemia   . Blood transfusion   . Hyperlipidemia   . Complication of anesthesia     has woken up during surgery   . Pneumonia     hx of pneumonia     SURGICAL HISTORY:  Past Surgical History  Procedure Date  . Portacath placement   . Upper gastrointestinal endoscopy 02/2011    gastric polyps x 2  . Neck and thigh surgery     lymph nodes removed  . Esophagogastroduodenoscopy 08/22/2011    Procedure: ESOPHAGOGASTRODUODENOSCOPY (EGD);  Surgeon: Louis Meckel, MD;  Location: [facility];  Service: Endoscopy;  Laterality: N/A;  . Lymph node biopsy 10/02/11    left axillary    MEDICATIONS: Current Outpatient Prescriptions  Medication Sig Dispense Refill  . acyclovir (ZOVIRAX) 400 MG tablet Take 1 tablet (400 mg total) by mouth daily.  30 tablet  3  . albuterol (PROVENTIL HFA;VENTOLIN HFA) 108 (90 BASE) MCG/ACT inhaler Inhale 2 puffs into the lungs every 6 (  six) hours as needed. Wheezing       . allopurinol (ZYLOPRIM) 300 MG tablet Take 1 tablet (300 mg total) by mouth daily.  30 tablet  3  . ALPRAZolam (XANAX) 0.25 MG tablet Take 0.25 mg by mouth 2 (two) times daily as needed. anxiety      . Cyanocobalamin (VITAMIN B 12 PO) Take 1 tablet by  mouth daily.       . hydroxypropyl methylcellulose (ISOPTO TEARS) 2.5 % ophthalmic solution Place 1 drop into both eyes daily.      Marland Kitchen lidocaine-prilocaine (EMLA) cream Apply topically as needed.  30 g  2  . metFORMIN (GLUCOPHAGE) 500 MG tablet Take 500 mg by mouth 2 (two) times daily as needed.       . ondansetron (ZOFRAN) 8 MG tablet Take 1 tablet two times a day starting the day after chemo for 2 days. Then take 1 tablet two times a day as needed for nausea or vomiting.  30 tablet  1  . PRILOSEC OTC 20 MG tablet Take 20 mg by mouth 2 (two) times daily.       . prochlorperazine (COMPAZINE) 10 MG tablet Take 1 tablet (10 mg total) by mouth every 6 (six) hours as needed (Nausea or vomiting).  30 tablet  1  . ramipril (ALTACE) 10 MG tablet Take 20 mg by mouth daily after breakfast.       . senna (SENOKOT) 8.6 MG TABS Take 2 tablets by mouth daily as needed. Constipation       . venlafaxine (EFFEXOR-XR) 75 MG 24 hr capsule Take 75 mg by mouth daily after breakfast.       . acetaminophen (TYLENOL) 325 MG tablet Take 325-650 mg by mouth every 6 (six) hours as needed. Pain       . oxycodone (OXY-IR) 5 MG capsule Take 5 mg by mouth every 4 (four) hours as needed. Pain        No current facility-administered medications for this visit.   Facility-Administered Medications Ordered in Other Visits  Medication Dose Route Frequency Provider Last Rate Last Dose  . 0.9 %  sodium chloride infusion   Intravenous Once Jethro Bolus, MD 20 mL/hr at 11/06/11 0940    . acetaminophen (TYLENOL) tablet 650 mg  650 mg Oral Once Jethro Bolus, MD   650 mg at 11/06/11 0939  . bendamustine (TREANDA) 160 mg in sodium chloride 0.9 % 500 mL chemo infusion  90 mg/m2 (Treatment Plan Actual) Intravenous Once Jethro Bolus, MD      . dexamethasone (DECADRON) injection 10 mg  10 mg Intravenous Once Jethro Bolus, MD      . diphenhydrAMINE (BENADRYL) capsule 50 mg  50 mg Oral Once Jethro Bolus, MD   50 mg at 11/06/11 0939  . heparin lock flush 100 unit/mL   500 Units Intracatheter Once PRN Jethro Bolus, MD      . methylPREDNISolone sodium succinate (SOLU-MEDROL) 125 MG injection 125 mg  125 mg Intravenous Once Jethro Bolus, MD   125 mg at 11/06/11 7829  . ondansetron (ZOFRAN) IVPB 8 mg  8 mg Intravenous Once Jethro Bolus, MD      . riTUXimab (RITUXAN) 700 mg in sodium chloride 0.9 % 250 mL chemo infusion  375 mg/m2 (Treatment Plan Actual) Intravenous Once Jethro Bolus, MD 68 mL/hr at 11/06/11 1120 150 mg at 11/06/11 1120  . sodium chloride 0.9 % injection 10 mL  10 mL Intracatheter PRN Jethro Bolus, MD  ALLERGIES:  is allergic to penicillins and shellfish allergy.  REVIEW OF SYSTEMS:  The rest of the 14-point review of system was negative.   Filed Vitals:   11/06/11 0836  BP: 110/62  Pulse: 49  Temp: 97.5 F (36.4 C)   Wt Readings from Last 3 Encounters:  11/06/11 143 lb 6.4 oz (65.046 kg)  10/25/11 140 lb (63.504 kg)  10/22/11 141 lb 9.6 oz (64.229 kg)   ECOG Performance status: 0-1  PHYSICAL EXAMINATION:  General: thin-appearing woman in no acute distress.  Eyes:  no scleral icterus.  ENT:  There were no oropharyngeal lesions.  Neck was without thyromegaly.  Lymphatics:  I could not longer palpate extensive adenopathy.  There is some swelling in the left axilla but no frank adenopathy.   Respiratory: lungs were clear bilaterally without wheezing or crackles.  Cardiovascular:  Regular rate and rhythm, S1/S2, without murmur, rub or gallop.  The right leg edema has completely resolved.  GI:  abdomen was soft, flat, nontender, nondistended, without organomegaly. I could feel a vague abdominal mass in the mid abdomen without clear border.   Muscoloskeletal:  no spinal tenderness of palpation of vertebral spine.  Skin exam was without echymosis, petichae.  Neuro exam was nonfocal.  Patient was able to get on and off exam table without assistance.  Gait was normal.  Patient was alerted and oriented.  Attention was good.   Language was appropriate.  Mood was normal  without depression.  Speech was not pressured.  Thought content was not tangential.     LABORATORY/RADIOLOGY DATA:  Lab Results  Component Value Date   WBC 4.6 11/06/2011   HGB 12.7 11/06/2011   HCT 37.5 11/06/2011   PLT 166 11/06/2011   GLUCOSE 96 10/22/2011   ALT 12 10/22/2011   AST 17 10/22/2011   NA 143 10/22/2011   K 4.7 10/22/2011   CL 105 10/22/2011   CREATININE 0.78 10/22/2011   BUN 12 10/22/2011   CO2 29 10/22/2011   HGBA1C 5.8* 09/10/2011     ASSESSMENT AND PLAN:   1.  GERD:  She is on Prilosec.  Her symptoms have improved.  2.  HTN:  She is on Ramipril with good BP control.  3.  DM, type II:  She is on Metformin.  4.  Depression, anxiety:  She is on Effexor XR.   5.  Intermittent cholestatis:  From #5 below.  Recent work up did not show evidence of biliary disease.  6.  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    She tolerated chemotherapy Bendamustine/Rituxan very well except for slight infusion reaction with Rituxan.  She has not significant improvement of her palpable adenopathy and performance status, eating habit.  There is no evidence of tumor lysis syndrome with mid cycle lab check.  I thus recommended proceeding with the 2nd cycle of chemo today without dose modification.  Given her reaction to Rituxan last cycle, I added on Solumedrol 125mg  IV x one pre med.   I will see her in about 4 wks before the 3nd planned cycle of chemo before restaging PET scan.   FULL CODE.

## 2011-11-07 ENCOUNTER — Ambulatory Visit (HOSPITAL_BASED_OUTPATIENT_CLINIC_OR_DEPARTMENT_OTHER): Payer: BC Managed Care – PPO

## 2011-11-07 ENCOUNTER — Telehealth: Payer: Self-pay | Admitting: Oncology

## 2011-11-07 VITALS — BP 105/60 | HR 82 | Temp 96.8°F

## 2011-11-07 DIAGNOSIS — C8590 Non-Hodgkin lymphoma, unspecified, unspecified site: Secondary | ICD-10-CM

## 2011-11-07 DIAGNOSIS — C8589 Other specified types of non-Hodgkin lymphoma, extranodal and solid organ sites: Secondary | ICD-10-CM

## 2011-11-07 DIAGNOSIS — Z5111 Encounter for antineoplastic chemotherapy: Secondary | ICD-10-CM

## 2011-11-07 MED ORDER — DEXAMETHASONE SODIUM PHOSPHATE 10 MG/ML IJ SOLN
10.0000 mg | Freq: Once | INTRAMUSCULAR | Status: AC
Start: 2011-11-07 — End: 2011-11-07
  Administered 2011-11-07: 10 mg via INTRAVENOUS

## 2011-11-07 MED ORDER — SODIUM CHLORIDE 0.9 % IV SOLN
90.0000 mg/m2 | Freq: Once | INTRAVENOUS | Status: AC
  Administered 2011-11-07: 160 mg via INTRAVENOUS
  Filled 2011-11-07: qty 32

## 2011-11-07 MED ORDER — HEPARIN SOD (PORK) LOCK FLUSH 100 UNIT/ML IV SOLN
500.0000 [IU] | Freq: Once | INTRAVENOUS | Status: AC | PRN
  Administered 2011-11-07: 500 [IU]
  Filled 2011-11-07: qty 5

## 2011-11-07 MED ORDER — SODIUM CHLORIDE 0.9 % IJ SOLN
10.0000 mL | INTRAMUSCULAR | Status: DC | PRN
  Administered 2011-11-07: 10 mL
  Filled 2011-11-07: qty 10

## 2011-11-07 MED ORDER — SODIUM CHLORIDE 0.9 % IV SOLN
Freq: Once | INTRAVENOUS | Status: AC
  Administered 2011-11-07: 14:00:00 via INTRAVENOUS

## 2011-11-07 MED ORDER — ONDANSETRON 8 MG/50ML IVPB (CHCC)
8.0000 mg | Freq: Once | INTRAVENOUS | Status: AC
  Administered 2011-11-07: 8 mg via INTRAVENOUS

## 2011-11-07 NOTE — Telephone Encounter (Signed)
sch printed and taken to pt in tx room   aom 

## 2011-11-08 ENCOUNTER — Ambulatory Visit (HOSPITAL_BASED_OUTPATIENT_CLINIC_OR_DEPARTMENT_OTHER): Payer: BC Managed Care – PPO

## 2011-11-08 VITALS — BP 131/71 | HR 72 | Temp 97.3°F

## 2011-11-08 DIAGNOSIS — Z5189 Encounter for other specified aftercare: Secondary | ICD-10-CM

## 2011-11-08 DIAGNOSIS — C8590 Non-Hodgkin lymphoma, unspecified, unspecified site: Secondary | ICD-10-CM

## 2011-11-08 DIAGNOSIS — C8589 Other specified types of non-Hodgkin lymphoma, extranodal and solid organ sites: Secondary | ICD-10-CM

## 2011-11-08 MED ORDER — PEGFILGRASTIM INJECTION 6 MG/0.6ML
6.0000 mg | Freq: Once | SUBCUTANEOUS | Status: AC
  Administered 2011-11-08: 6 mg via SUBCUTANEOUS
  Filled 2011-11-08: qty 0.6

## 2011-11-11 ENCOUNTER — Telehealth: Payer: Self-pay | Admitting: *Deleted

## 2011-11-11 MED ORDER — PROMETHAZINE HCL 25 MG RE SUPP: 25.0000 mg | Freq: Two times a day (BID) | RECTAL | Status: DC | PRN

## 2011-11-11 NOTE — Telephone Encounter (Signed)
Call from pt's husband reporting pt has had a lot of n/v over the weekend. She had temp max 99.66F.  Also c/o headaches w/ some jaw and neck pains.  They called on call MD last night who prescribed Zofran ODT, which pt has been taking q 6hrs ATC.    Called pt self for update on her condition.  She reports able to take sips of liquids today and no vomiting, but still nauseated and still taking zofran q 6hrs.  Instructed to alternate w/ compazine q 6hrs prn,, so pt could take zofran, then 3 hrs later take compazine and repeat as needed.  Also instructed Dr. Gaylyn Rong ordered phenergan supp q 12hrs prn if she can't keep PO meds down.  Instructed to hold the compazine if taking the phenergan.  Instructed pt to push po fluids to prevent dehydration and call back for unrelieved n/v or any fevers over 100.63F or any other new or concerning symptoms.  Pt verbalized understanding.

## 2011-11-15 ENCOUNTER — Ambulatory Visit: Payer: BC Managed Care – PPO | Admitting: Nutrition

## 2011-11-15 NOTE — Assessment & Plan Note (Signed)
Victoria Wood is a 61 year old female patient of Dr. Lodema Pilot diagnosed with non-Hodgkin lymphoma about 26 years ago.  In 2008 she had recurrent lymphoma and in 2012 she showed progressive disease.  HISTORY:  Includes GERD, hypertension, diabetes, hepatitis cholestatic, asthma, anemia, and hyperlipidemia.  MEDICATIONS:  Include Xanax, vitamin B12, Glucophage, Zofran, oxycodone, Prilosec, Compazine, Phenergan, Senokot, and Effexor XR.  LABORATORIES:  Include a glucose of 121 on February 27,  HEIGHT:  66 inches. WEIGHT:  136.4 pounds. USUAL BODY WEIGHT:  150 pounds. BMI:  22.03.  Patient and daughter present to nutrition followup.  She reports that she experienced severe nausea and vomiting after her chemotherapy.  She is now beginning to start to eat again.  She typically consumes 1 Ensure Plus a day.  Many foods do not appeal to her and she does not really have a taste for much.  NUTRITION DIAGNOSIS:  Unintended weight loss related to diagnosis of lymphoma and associated treatments, as evidenced by 9% weight loss from usual body weight.  INTERVENTION:  I have educated the patient and daughter on the importance of small frequent meals with high-protein, high-calorie foods to promote weight gain.  I have encouraged her to continue Ensure Plus or other nutritional supplements such as Acupuncturist or Orgain.  I have encouraged her to gravitate towards cold protein foods every 2 hours.  I have provided a list of foods to choose from if she is having nausea.  I have also educated her on strategies for dealing with constipation.  I have discussed the importance of increased fluids.  I provided the patient with nutritional samples, fact sheets, and my contact information for questions and concerns.  MONITORING/EVALUATION (GOALS):  The patient will tolerate increased oral diet to promote weight gain.  NEXT VISIT:  Wednesday, March 27th during  chemotherapy.    ______________________________ Zenovia Jarred, RD, LDN Clinical Nutrition Specialist BN/MEDQ  D:  11/15/2011  T:  11/15/2011  Job:  [id number]

## 2011-11-25 ENCOUNTER — Other Ambulatory Visit: Payer: Self-pay | Admitting: Oncology

## 2011-11-25 ENCOUNTER — Ambulatory Visit (HOSPITAL_BASED_OUTPATIENT_CLINIC_OR_DEPARTMENT_OTHER): Payer: BC Managed Care – PPO | Admitting: Oncology

## 2011-11-25 ENCOUNTER — Ambulatory Visit (HOSPITAL_BASED_OUTPATIENT_CLINIC_OR_DEPARTMENT_OTHER): Payer: BC Managed Care – PPO | Admitting: Lab

## 2011-11-25 VITALS — BP 93/58 | HR 120 | Temp 99.1°F | Ht 66.0 in | Wt 140.5 lb

## 2011-11-25 DIAGNOSIS — R509 Fever, unspecified: Secondary | ICD-10-CM

## 2011-11-25 DIAGNOSIS — C8590 Non-Hodgkin lymphoma, unspecified, unspecified site: Secondary | ICD-10-CM

## 2011-11-25 DIAGNOSIS — R110 Nausea: Secondary | ICD-10-CM

## 2011-11-25 DIAGNOSIS — A084 Viral intestinal infection, unspecified: Secondary | ICD-10-CM

## 2011-11-25 DIAGNOSIS — C8588 Other specified types of non-Hodgkin lymphoma, lymph nodes of multiple sites: Secondary | ICD-10-CM

## 2011-11-25 DIAGNOSIS — K5900 Constipation, unspecified: Secondary | ICD-10-CM

## 2011-11-25 DIAGNOSIS — C8589 Other specified types of non-Hodgkin lymphoma, extranodal and solid organ sites: Secondary | ICD-10-CM

## 2011-11-25 LAB — COMPREHENSIVE METABOLIC PANEL
Albumin: 3.5 g/dL (ref 3.5–5.2)
BUN: 17 mg/dL (ref 6–23)
CO2: 29 mEq/L (ref 19–32)
Calcium: 9 mg/dL (ref 8.4–10.5)
Chloride: 101 mEq/L (ref 96–112)
Glucose, Bld: 143 mg/dL — ABNORMAL HIGH (ref 70–99)
Potassium: 3.7 mEq/L (ref 3.5–5.3)

## 2011-11-25 LAB — CBC WITH DIFFERENTIAL/PLATELET
Basophils Absolute: 0 10*3/uL (ref 0.0–0.1)
Eosinophils Absolute: 0.3 10*3/uL (ref 0.0–0.5)
HGB: 12.4 g/dL (ref 11.6–15.9)
MCV: 89.9 fL (ref 79.5–101.0)
MONO#: 0.3 10*3/uL (ref 0.1–0.9)
MONO%: 4.4 % (ref 0.0–14.0)
NEUT#: 5.3 10*3/uL (ref 1.5–6.5)
RDW: 18.4 % — ABNORMAL HIGH (ref 11.2–14.5)
lymph#: 0.5 10*3/uL — ABNORMAL LOW (ref 0.9–3.3)

## 2011-11-25 LAB — LACTATE DEHYDROGENASE: LDH: 143 U/L (ref 94–250)

## 2011-11-25 NOTE — Progress Notes (Signed)
[facility] OFFICE PROGRESS NOTE  Cc:  No primary provider on file.  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 She was successfully treated with chemotherapy and remained disease free for 29 years. She does not remember exact chemoregimen since that oncology practice is no longer in service. In 2008, she was found to have recurrent stage IV follicular disease which she underwent chemotherapy (FND-Fludarabine/Mitoxantrone/Dex?) and was in remission. Since then, Dr. Parks Ranger [phone]) has been following her progression with biannually PET scan with evidence of progressive disease. In June 2012, she had small shotty adenopathy in the neck, chest, and abdomen. Decision was for watchful observation. In October 2012, her B symptoms came back and after evaluated and confirmed by Dr. Parks Ranger with progressive disease, she received single agent weekly Rituximab x 5 doses with last dose on11/21/2012.  She has since transferred her care to [place], [place].    CURRENT THERAPY: started Bendamustine/Rituxan on 10/09/2011 before R-ICE chemo prior to autologous BMT.   INTERVAL HISTORY: Victoria Wood 60 y.o. female returns to clinic today with her daughter. She is seen today for fever. She reports temp of 101.8 at home today, with headache, fatigue, vomiting, and sore throat. Granddaughter has the same symptoms.  She also reports constipation. Senokot ordered, but patient states that it was not working and therefore she stopped it. She has also tried an OTC stool softener that was not effective. Reports abdomen feels full. No BM in ~ 1 week. Patient states that she usually has 1-2 BM per day.  Patient denies visual changes, confusion, drenching night sweats, palpable lymph node  swelling, mucositis, dysphagia, jaundice, chest pain, palpitation, shortness of breath, dyspnea on exertion, productive cough, gum bleeding, epistaxis, hematemesis, hemoptysis, abdominal pain, abdominal swelling, early satiety, melena, hematochezia, hematuria, skin rash, spontaneous bleeding, joint swelling, joint pain, heat or cold intolerance, bowel bladder incontinence, back pain, focal motor weakness, paresthesia, depression, suicidal or homocidal ideation, feeling hopelessness.     MEDICAL HISTORY: Past Medical History  Diagnosis Date  . Gastric polyps     History of  . GERD (gastroesophageal reflux disease)   . HTN (hypertension)   . DM type 2 (diabetes mellitus, type 2)   . Asthma   . Non Hodgkin's lymphoma   . Cancer     hx of Rituxan in 07/2011  . Non-Hodgkin's lymphoma   . Anemia   . Blood transfusion   . Hyperlipidemia   . Complication of anesthesia     has woken up during surgery   . Pneumonia     hx of pneumonia     SURGICAL HISTORY:  Past Surgical History  Procedure Date  . Portacath placement   . Upper gastrointestinal endoscopy 02/2011    gastric polyps x 2  . Neck and thigh surgery     lymph nodes removed  . Esophagogastroduodenoscopy 08/22/2011    Procedure: ESOPHAGOGASTRODUODENOSCOPY (EGD);  Surgeon: Louis Meckel, MD;  Location: [facility];  Service: Endoscopy;  Laterality: N/A;  . Lymph node biopsy 10/02/11    left axillary    MEDICATIONS: Current Outpatient Prescriptions  Medication Sig Dispense Refill  . acetaminophen (TYLENOL) 325 MG tablet Take 325-650 mg by mouth every 6 (six) hours as needed. Pain       . acyclovir (ZOVIRAX)  400 MG tablet Take 1 tablet (400 mg total) by mouth daily.  30 tablet  3  . albuterol (PROVENTIL HFA;VENTOLIN HFA) 108 (90 BASE) MCG/ACT inhaler Inhale 2 puffs into the lungs every 6 (six) hours as needed. Wheezing       . allopurinol (ZYLOPRIM) 300 MG tablet Take 1 tablet (300 mg total) by mouth daily.  30 tablet  3    . ALPRAZolam (XANAX) 0.25 MG tablet Take 0.25 mg by mouth 2 (two) times daily as needed. anxiety      . hydroxypropyl methylcellulose (ISOPTO TEARS) 2.5 % ophthalmic solution Place 1 drop into both eyes daily.      Marland Kitchen lidocaine-prilocaine (EMLA) cream Apply topically as needed.  30 g  2  . metFORMIN (GLUCOPHAGE) 500 MG tablet Take 500 mg by mouth 2 (two) times daily as needed.       . ondansetron (ZOFRAN) 8 MG tablet Take 1 tablet two times a day starting the day after chemo for 2 days. Then take 1 tablet two times a day as needed for nausea or vomiting.  30 tablet  1  . oxycodone (OXY-IR) 5 MG capsule Take 5 mg by mouth every 4 (four) hours as needed. Pain       . prochlorperazine (COMPAZINE) 10 MG tablet Take 1 tablet (10 mg total) by mouth every 6 (six) hours as needed (Nausea or vomiting).  30 tablet  1  . ramipril (ALTACE) 10 MG tablet Take 20 mg by mouth daily after breakfast.       . venlafaxine (EFFEXOR-XR) 75 MG 24 hr capsule Take 75 mg by mouth daily after breakfast.       . Cyanocobalamin (VITAMIN B 12 PO) Take 1 tablet by mouth daily.       Marland Kitchen PRILOSEC OTC 20 MG tablet Take 20 mg by mouth 2 (two) times daily.       Marland Kitchen senna (SENOKOT) 8.6 MG TABS Take 2 tablets by mouth daily as needed. Constipation         ALLERGIES:  is allergic to penicillins and shellfish allergy.  REVIEW OF SYSTEMS:  The rest of the 14-point review of system was negative.   Filed Vitals:   11/25/11 1548  BP: 93/58  Pulse: 120  Temp: 99.1 F (37.3 C)   Wt Readings from Last 3 Encounters:  11/25/11 140 lb 8 oz (63.73 kg)  11/15/11 136 lb 6.4 oz (61.871 kg)  11/06/11 143 lb 6.4 oz (65.046 kg)   ECOG Performance status: 0-1  PHYSICAL EXAMINATION:  General: thin-appearing woman in no acute distress.  Eyes:  no scleral icterus.  ENT:  Tympanic membranes are clear. Posterior pharynx red without exudate. There were no oropharyngeal lesions.  Neck was without thyromegaly.  Lymphatics:  I could not longer  palpate extensive adenopathy.  There is some swelling in the left axilla but no frank adenopathy.   Respiratory: lungs were clear bilaterally without wheezing or crackles.  Cardiovascular:  Tachycardic. Regular rhythm, S1/S2, without murmur, rub or gallop.  No LE edema. GI:  abdomen was soft, flat, nontender, nondistended, without organomegaly.Previously palpated mass in abdomen no longer palpated.   Muscoloskeletal:  no spinal tenderness of palpation of vertebral spine.  Skin exam was without echymosis, petichae.  Neuro exam was nonfocal.  Patient was able to get on and off exam table without assistance.  Gait was normal.  Patient was alerted and oriented.  Attention was good.   Language was appropriate.  Mood was normal without depression.  Speech was not pressured.  Thought content was not tangential.     LABORATORY/RADIOLOGY DATA:  Lab Results  Component Value Date   WBC 6.4 11/25/2011   HGB 12.4 11/25/2011   HCT 36.5 11/25/2011   PLT 169 11/25/2011   GLUCOSE 143* 11/25/2011   ALT 16 11/25/2011   AST 19 11/25/2011   NA 138 11/25/2011   K 3.7 11/25/2011   CL 101 11/25/2011   CREATININE 0.82 11/25/2011   BUN 17 11/25/2011   CO2 29 11/25/2011   HGBA1C 5.8* 09/10/2011     ASSESSMENT AND PLAN:   1. Fever. Suspect viral in nature. Total WBC is WNL. Encouraged fluids. Patient to continue to monitor temperature and symptoms and if not improving in the next few days, she will call our office back. 2. Nausea/vomiting. Able to drink fluids well at this time. Continue use of Zofran alternating with Compazine. 3. HTN. Currently on Ramipril, but BP is low today. I have asked patient to hold Ramipril for now. Will recheck BP at next visit on 12/04/11. 4. Constipation. I have recommended Magnesium citrate today for constipation. She will begin Colace 100 mg PO BID along with Miralax 17 grams in oz of water daily. 5. GERD:  She is on Prilosec.  Her symptoms have improved.  6.  DM, type II:  She is on Metformin.    7.  Depression, anxiety:  She is on Effexor XR.   8.  Intermittent cholestatis:  From #5 below.  Recent work up did not show evidence of biliary disease.  9.  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  She is now s/p 2 cycles of Bendamustine/Rituxan. She has had a mild reaction to Rituxan during cycle #1, but is now pre-medicated with Solu-medrol. She had no evidence of tumor lysis syndrome with mid cycle lab check.  She is scheduled back for her third cycle of chemotherapy on 12/04/11 and she will keep these appointment. Plan is for a restaging PET scan following the 3rd cycle of chemotherapy.  FULL CODE.   Patient was seen with Dr Gaylyn Rong. >90% of the treatment plan, assessment, and visit was performed by Dr Gaylyn Rong. The length of time of the face-to-face encounter was 30 minutes. More than 50% of time was spent counseling and coordination of care.

## 2011-12-03 ENCOUNTER — Other Ambulatory Visit: Payer: BC Managed Care – PPO | Admitting: Lab

## 2011-12-03 ENCOUNTER — Ambulatory Visit (HOSPITAL_BASED_OUTPATIENT_CLINIC_OR_DEPARTMENT_OTHER): Payer: BC Managed Care – PPO | Admitting: Oncology

## 2011-12-03 ENCOUNTER — Telehealth: Payer: Self-pay | Admitting: Oncology

## 2011-12-03 ENCOUNTER — Encounter: Payer: Self-pay | Admitting: Oncology

## 2011-12-03 VITALS — BP 137/81 | HR 98 | Temp 99.0°F | Ht 66.0 in | Wt 142.3 lb

## 2011-12-03 DIAGNOSIS — C8590 Non-Hodgkin lymphoma, unspecified, unspecified site: Secondary | ICD-10-CM

## 2011-12-03 DIAGNOSIS — C8589 Other specified types of non-Hodgkin lymphoma, extranodal and solid organ sites: Secondary | ICD-10-CM

## 2011-12-03 DIAGNOSIS — G4700 Insomnia, unspecified: Secondary | ICD-10-CM

## 2011-12-03 LAB — CBC WITH DIFFERENTIAL/PLATELET
BASO%: 0.5 % (ref 0.0–2.0)
Eosinophils Absolute: 0.5 10*3/uL (ref 0.0–0.5)
MONO#: 0.5 10*3/uL (ref 0.1–0.9)
MONO%: 7.7 % (ref 0.0–14.0)
NEUT#: 4.9 10*3/uL (ref 1.5–6.5)
RBC: 3.86 10*6/uL (ref 3.70–5.45)
RDW: 18.6 % — ABNORMAL HIGH (ref 11.2–14.5)
WBC: 6.8 10*3/uL (ref 3.9–10.3)

## 2011-12-03 LAB — COMPREHENSIVE METABOLIC PANEL
ALT: 18 U/L (ref 0–35)
Albumin: 4 g/dL (ref 3.5–5.2)
Alkaline Phosphatase: 99 U/L (ref 39–117)
CO2: 31 mEq/L (ref 19–32)
Glucose, Bld: 117 mg/dL — ABNORMAL HIGH (ref 70–99)
Potassium: 4.1 mEq/L (ref 3.5–5.3)
Sodium: 143 mEq/L (ref 135–145)
Total Protein: 5.8 g/dL — ABNORMAL LOW (ref 6.0–8.3)

## 2011-12-03 MED ORDER — ZOLPIDEM TARTRATE 5 MG PO TABS: 5.0000 mg | ORAL_TABLET | Freq: Every evening | ORAL | Status: DC | PRN

## 2011-12-03 NOTE — Progress Notes (Signed)
[facility] OFFICE PROGRESS NOTE  Cc:  No primary provider on file.  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 She was successfully treated with chemotherapy and remained disease free for 29 years. She does not remember exact chemoregimen since that oncology practice is no longer in service. In 2008, she was found to have recurrent stage IV follicular disease which she underwent chemotherapy (FND-Fludarabine/Mitoxantrone/Dex?) and was in remission. Since then, Dr. Parks Ranger [phone]) has been following her progression with biannually PET scan with evidence of progressive disease. In June 2012, she had small shotty adenopathy in the neck, chest, and abdomen. Decision was for watchful observation. In October 2012, her B symptoms came back and after evaluated and confirmed by Dr. Parks Ranger with progressive disease, she received single agent weekly Rituximab x 5 doses with last dose on11/21/2012.  She has since transferred her care to [place], [place].    CURRENT THERAPY: started Bendamustine/Rituxan on 10/09/2011 before R-ICE chemo prior to autologous BMT.   INTERVAL HISTORY: Victoria Wood 61 y.o. female returns to clinic today with her daughter. She is seen today for routine follow-up prior to chemotherapy. Reports cold symptoms that are improving. Still with sinus congestion, but no fevers, headaches, facial pain, sore throat, chest pain, or shortness of breath. Family members have had the same symptoms. No nausea or vomiting. Appetite is good.  She also reports constipation. She had this problem at there last visit and was instructed to take Colace BID along with Miralax daily. She took these until her constipation was relieved, but stopped. SHe reports that she is  not sleeping well some nights. Naps infrequently, but has napped as long as 3 hours on one occasion.   Patient denies visual changes, confusion, drenching night sweats, palpable lymph node swelling, mucositis, dysphagia, jaundice, chest pain, palpitation, shortness of breath, dyspnea on exertion, productive cough, gum bleeding, epistaxis, hematemesis, hemoptysis, abdominal pain, abdominal swelling, early satiety, melena, hematochezia, hematuria, skin rash, spontaneous bleeding, joint swelling, joint pain, heat or cold intolerance, bowel bladder incontinence, back pain, focal motor weakness, paresthesia, depression, suicidal or homocidal ideation, feeling hopelessness.     MEDICAL HISTORY: Past Medical History  Diagnosis Date  . Gastric polyps     History of  . GERD (gastroesophageal reflux disease)   . HTN (hypertension)   . DM type 2 (diabetes mellitus, type 2)   . Asthma   . Non Hodgkin's lymphoma   . Cancer     hx of Rituxan in 07/2011  . Non-Hodgkin's lymphoma   . Anemia   . Blood transfusion   . Hyperlipidemia   . Complication of anesthesia     has woken up during surgery   . Pneumonia     hx of pneumonia     SURGICAL HISTORY:  Past Surgical History  Procedure Date  . Portacath placement   . Upper gastrointestinal endoscopy 02/2011    gastric polyps x 2  . Neck and thigh surgery     lymph nodes removed  . Esophagogastroduodenoscopy 08/22/2011    Procedure: ESOPHAGOGASTRODUODENOSCOPY (EGD);  Surgeon: Louis Meckel, MD;  Location: [facility];  Service: Endoscopy;  Laterality: N/A;  . Lymph node biopsy 10/02/11    left axillary    MEDICATIONS: Current Outpatient Prescriptions  Medication Sig Dispense Refill  .  acetaminophen (TYLENOL) 325 MG tablet Take 325-650 mg by mouth every 6 (six) hours as needed. Pain       . acyclovir (ZOVIRAX) 400 MG tablet Take 1 tablet (400 mg total) by mouth daily.  30 tablet  3  . albuterol (PROVENTIL HFA;VENTOLIN HFA) 108 (90 BASE)  MCG/ACT inhaler Inhale 2 puffs into the lungs every 6 (six) hours as needed. Wheezing       . allopurinol (ZYLOPRIM) 300 MG tablet Take 1 tablet (300 mg total) by mouth daily.  30 tablet  3  . ALPRAZolam (XANAX) 0.25 MG tablet Take 0.25 mg by mouth 2 (two) times daily as needed. anxiety      . Cyanocobalamin (VITAMIN B 12 PO) Take 1 tablet by mouth daily.       . hydroxypropyl methylcellulose (ISOPTO TEARS) 2.5 % ophthalmic solution Place 1 drop into both eyes daily.      Marland Kitchen lidocaine-prilocaine (EMLA) cream Apply topically as needed.  30 g  2  . metFORMIN (GLUCOPHAGE) 500 MG tablet Take 500 mg by mouth 2 (two) times daily as needed.       . ondansetron (ZOFRAN) 8 MG tablet Take 1 tablet two times a day starting the day after chemo for 2 days. Then take 1 tablet two times a day as needed for nausea or vomiting.  30 tablet  1  . oxycodone (OXY-IR) 5 MG capsule Take 5 mg by mouth every 4 (four) hours as needed. Pain       . PRILOSEC OTC 20 MG tablet Take 20 mg by mouth 2 (two) times daily.       . prochlorperazine (COMPAZINE) 10 MG tablet Take 1 tablet (10 mg total) by mouth every 6 (six) hours as needed (Nausea or vomiting).  30 tablet  1  . ramipril (ALTACE) 10 MG tablet Take 20 mg by mouth daily after breakfast.       . venlafaxine (EFFEXOR-XR) 75 MG 24 hr capsule Take 75 mg by mouth daily after breakfast.       . zolpidem (AMBIEN) 5 MG tablet Take 1 tablet (5 mg total) by mouth at bedtime as needed for sleep.  30 tablet  0  . docusate sodium (COLACE) 100 MG capsule Take 100 mg by mouth 2 (two) times daily.      . polyethylene glycol (MIRALAX / GLYCOLAX) packet Take 17 g by mouth daily.      Marland Kitchen senna (SENOKOT) 8.6 MG TABS Take 2 tablets by mouth daily as needed. Constipation         ALLERGIES:  is allergic to penicillins and shellfish allergy.  REVIEW OF SYSTEMS:  The rest of the 14-point review of system was negative.   Filed Vitals:   12/03/11 0837  BP: 137/81  Pulse: 98  Temp: 99 F  (37.2 C)   Wt Readings from Last 3 Encounters:  12/03/11 142 lb 4.8 oz (64.547 kg)  11/25/11 140 lb 8 oz (63.73 kg)  11/15/11 136 lb 6.4 oz (61.871 kg)   ECOG Performance status: 0-1  PHYSICAL EXAMINATION:  General: thin-appearing woman in no acute distress.  Eyes:  no scleral icterus.  ENT:  Tympanic membranes are clear. Posterior pharynx without exudate. There were no oropharyngeal lesions.  Neck was without thyromegaly.  Lymphatics:  I could not longer palpate extensive adenopathy.  There is some swelling in the left axilla but no frank adenopathy.   Respiratory: lungs were clear bilaterally without wheezing or crackles.  Cardiovascular:  Tachycardic. Regular rhythm,  S1/S2, without murmur, rub or gallop.  No LE edema. GI:  abdomen was soft, flat, nontender, nondistended, without organomegaly.Previously palpated mass in abdomen no longer palpated.   Muscoloskeletal:  no spinal tenderness of palpation of vertebral spine.  Skin exam was without echymosis, petichae.  Neuro exam was nonfocal.  Patient was able to get on and off exam table without assistance.  Gait was normal.  Patient was alerted and oriented.  Attention was good.   Language was appropriate.  Mood was normal without depression.  Speech was not pressured.  Thought content was not tangential.     LABORATORY/RADIOLOGY DATA:  Lab Results  Component Value Date   WBC 6.8 12/03/2011   HGB 11.9 12/03/2011   HCT 35.3 12/03/2011   PLT 249 12/03/2011   GLUCOSE 143* 11/25/2011   ALT 16 11/25/2011   AST 19 11/25/2011   NA 138 11/25/2011   K 3.7 11/25/2011   CL 101 11/25/2011   CREATININE 0.82 11/25/2011   BUN 17 11/25/2011   CO2 29 11/25/2011   HGBA1C 5.8* 09/10/2011     ASSESSMENT AND PLAN:   1.  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  She is now s/p 2 cycles of Bendamustine/Rituxan. She has had a mild reaction to Rituxan during cycle #1, but is now pre-medicated with Solu-medrol. She had no  evidence of tumor lysis syndrome with mid cycle lab check.  Dr Gaylyn Rong has spoken with the patient and has recommended delaying chemotherapy due to ongoing cold symptoms, but the patient is eager to proceed with her chemotherapy as scheduled. The patient will proceed with cycle #3 of Bendamustine/Rituxan this week. She was instructed to monitor her temperature closely. She is scheduled for a PET scan in ~ 4 weeks.  2. Insomnia. We have discussed limiting naps to 1 hour or less per day when needed. Prescription for Ambien given. Risks/benfits of medications have been discussed with the patient and her daughter. 3. HTN. Ramipril remains on hold. Will monitor BP and instruct patient to restart as needed.  4. Constipation. Medications discussed. Recommended that the patient take Colace 100 mg PO BID along with Miralax 17 grams in oz of water daily on a routine basis. 5. GERD:  She is on Prilosec.  Her symptoms have improved.  6.  DM, type II:  She is on Metformin.  7.  Depression, anxiety:  She is on Effexor XR.   8.  Intermittent cholestatis:  From #5 below.  Recent work up did not show evidence of biliary disease.   FULL CODE.   Patient was seen with Dr Gaylyn Rong. >90% of the treatment plan, assessment, and visit was performed by Dr Gaylyn Rong. The length of time of the face-to-face encounter was 30 minutes. More than 50% of time was spent counseling and coordination of care.

## 2011-12-03 NOTE — Telephone Encounter (Signed)
appts made and printed for pt aom °

## 2011-12-04 ENCOUNTER — Ambulatory Visit (HOSPITAL_BASED_OUTPATIENT_CLINIC_OR_DEPARTMENT_OTHER): Payer: BC Managed Care – PPO

## 2011-12-04 ENCOUNTER — Ambulatory Visit: Payer: BC Managed Care – PPO | Admitting: Nutrition

## 2011-12-04 VITALS — BP 139/76 | HR 100 | Temp 98.2°F

## 2011-12-04 DIAGNOSIS — C8590 Non-Hodgkin lymphoma, unspecified, unspecified site: Secondary | ICD-10-CM

## 2011-12-04 DIAGNOSIS — C8589 Other specified types of non-Hodgkin lymphoma, extranodal and solid organ sites: Secondary | ICD-10-CM

## 2011-12-04 DIAGNOSIS — Z5112 Encounter for antineoplastic immunotherapy: Secondary | ICD-10-CM

## 2011-12-04 DIAGNOSIS — Z5111 Encounter for antineoplastic chemotherapy: Secondary | ICD-10-CM

## 2011-12-04 MED ORDER — ACETAMINOPHEN 325 MG PO TABS
650.0000 mg | ORAL_TABLET | Freq: Once | ORAL | Status: AC
  Administered 2011-12-04: 650 mg via ORAL

## 2011-12-04 MED ORDER — HYDROCODONE-ACETAMINOPHEN 5-325 MG PO TABS
1.0000 | ORAL_TABLET | Freq: Once | ORAL | Status: AC
  Administered 2011-12-04: 1 via ORAL

## 2011-12-04 MED ORDER — DEXAMETHASONE SODIUM PHOSPHATE 10 MG/ML IJ SOLN
10.0000 mg | Freq: Once | INTRAMUSCULAR | Status: AC
  Administered 2011-12-04: 10 mg via INTRAVENOUS

## 2011-12-04 MED ORDER — METHYLPREDNISOLONE SODIUM SUCC 125 MG IJ SOLR
125.0000 mg | Freq: Once | INTRAMUSCULAR | Status: AC
  Administered 2011-12-04: 125 mg via INTRAVENOUS

## 2011-12-04 MED ORDER — SODIUM CHLORIDE 0.9 % IV SOLN
Freq: Once | INTRAVENOUS | Status: AC
  Administered 2011-12-04: 08:00:00 via INTRAVENOUS

## 2011-12-04 MED ORDER — HEPARIN SOD (PORK) LOCK FLUSH 100 UNIT/ML IV SOLN
500.0000 [IU] | Freq: Once | INTRAVENOUS | Status: AC | PRN
  Administered 2011-12-04: 500 [IU]
  Filled 2011-12-04: qty 5

## 2011-12-04 MED ORDER — SODIUM CHLORIDE 0.9 % IJ SOLN
10.0000 mL | INTRAMUSCULAR | Status: DC | PRN
  Administered 2011-12-04: 10 mL
  Filled 2011-12-04: qty 10

## 2011-12-04 MED ORDER — SODIUM CHLORIDE 0.9 % IV SOLN
375.0000 mg/m2 | Freq: Once | INTRAVENOUS | Status: AC
  Administered 2011-12-04: 700 mg via INTRAVENOUS
  Filled 2011-12-04: qty 70

## 2011-12-04 MED ORDER — ONDANSETRON 8 MG/50ML IVPB (CHCC)
8.0000 mg | Freq: Once | INTRAVENOUS | Status: AC
  Administered 2011-12-04: 8 mg via INTRAVENOUS

## 2011-12-04 MED ORDER — SODIUM CHLORIDE 0.9 % IV SOLN
90.0000 mg/m2 | Freq: Once | INTRAVENOUS | Status: AC
  Administered 2011-12-04: 160 mg via INTRAVENOUS
  Filled 2011-12-04: qty 32

## 2011-12-04 MED ORDER — DIPHENHYDRAMINE HCL 25 MG PO CAPS
50.0000 mg | ORAL_CAPSULE | Freq: Once | ORAL | Status: AC
  Administered 2011-12-04: 50 mg via ORAL

## 2011-12-04 NOTE — Patient Instructions (Signed)
[facility] Discharge Instructions for Patients Receiving Chemotherapy  Today you received the following chemotherapy agents Rituxan and Treanda.  To help prevent nausea and vomiting after your treatment, we encourage you to take your nausea medication as prescribed by your physician.  If you develop nausea and vomiting that is not controlled by your nausea medication, call the clinic.  If it is after clinic hours your family physician or the after hours number for the clinic or go to the Emergency Department.   BELOW ARE SYMPTOMS THAT SHOULD BE REPORTED IMMEDIATELY:  *FEVER GREATER THAN 100.5 F  *CHILLS WITH OR WITHOUT FEVER  NAUSEA AND VOMITING THAT IS NOT CONTROLLED WITH YOUR NAUSEA MEDICATION  *UNUSUAL SHORTNESS OF BREATH  *UNUSUAL BRUISING OR BLEEDING  TENDERNESS IN MOUTH AND THROAT WITH OR WITHOUT PRESENCE OF ULCERS  *URINARY PROBLEMS  *BOWEL PROBLEMS  UNUSUAL RASH Items with * indicate a potential emergency and should be followed up as soon as possible.  Feel free to call the clinic you have any questions or concerns. The clinic phone number is [phone].   I have been informed and understand all the instructions given to me. I know to contact the clinic, my physician, or go to the Emergency Department if any problems should occur. I do not have any questions at this time, but understand that I may call the clinic during office hours   should I have any questions or need assistance in obtaining follow up care.    __________________________________________  _____________  __________ Signature of Patient or Authorized Representative            Date                   Time    __________________________________________ Nurse's Signature

## 2011-12-04 NOTE — Progress Notes (Signed)
Victoria Wood reports that she is doing much better since she has incorporated some of the nutritional strategies that I had educated her on.  Her weight has increased to 142.3 pounds as of yesterday from 136.4 pounds.  She reports that she does have relief of her constipation as she has started to be compliant with Colace and MiraLAX.  She reports increased energy and increased appetite.  NUTRITION DIAGNOSIS:  Unintended weight loss has improved.  INTERVENTION:  I have encouraged Victoria Wood to continue to follow strategies for increased calories and protein in small amounts throughout the day.  I have reinforced strategies for dealing with constipation and nausea as well as increased fluid intake.  MONITORING, EVALUATION AND GOALS:  The patient has tolerated increased oral diet to promote weight gain.  She will continue to work to consume a healthy plant-based diet with lean protein to minimize weight loss.   NEXT VISIT:  Wednesday, April 24th, during chemotherapy.    ______________________________ Zenovia Jarred, RD, LDN Clinical Nutrition Specialist BN/MEDQ  D:  12/04/2011  T:  12/04/2011  Job:  [phone]

## 2011-12-05 ENCOUNTER — Ambulatory Visit: Payer: BC Managed Care – PPO

## 2011-12-05 ENCOUNTER — Other Ambulatory Visit: Payer: Self-pay | Admitting: Oncology

## 2011-12-05 ENCOUNTER — Ambulatory Visit (HOSPITAL_BASED_OUTPATIENT_CLINIC_OR_DEPARTMENT_OTHER): Payer: BC Managed Care – PPO

## 2011-12-05 DIAGNOSIS — C8590 Non-Hodgkin lymphoma, unspecified, unspecified site: Secondary | ICD-10-CM

## 2011-12-05 DIAGNOSIS — Z5111 Encounter for antineoplastic chemotherapy: Secondary | ICD-10-CM

## 2011-12-05 DIAGNOSIS — C8589 Other specified types of non-Hodgkin lymphoma, extranodal and solid organ sites: Secondary | ICD-10-CM

## 2011-12-05 LAB — URINALYSIS, MICROSCOPIC - CHCC
Bilirubin (Urine): NEGATIVE
Blood: NEGATIVE
Glucose: NEGATIVE g/dL
Ketones: NEGATIVE mg/dL
Specific Gravity, Urine: 1.005 (ref 1.003–1.035)

## 2011-12-05 MED ORDER — ONDANSETRON 8 MG/50ML IVPB (CHCC)
8.0000 mg | Freq: Once | INTRAVENOUS | Status: AC
  Administered 2011-12-05: 8 mg via INTRAVENOUS

## 2011-12-05 MED ORDER — DEXAMETHASONE SODIUM PHOSPHATE 10 MG/ML IJ SOLN
10.0000 mg | Freq: Once | INTRAMUSCULAR | Status: AC
  Administered 2011-12-05: 10 mg via INTRAVENOUS

## 2011-12-05 MED ORDER — SODIUM CHLORIDE 0.9 % IV SOLN
Freq: Once | INTRAVENOUS | Status: AC
  Administered 2011-12-05: 14:00:00 via INTRAVENOUS

## 2011-12-05 MED ORDER — SODIUM CHLORIDE 0.9 % IJ SOLN
10.0000 mL | INTRAMUSCULAR | Status: DC | PRN
  Administered 2011-12-05: 10 mL
  Filled 2011-12-05: qty 10

## 2011-12-05 MED ORDER — HEPARIN SOD (PORK) LOCK FLUSH 100 UNIT/ML IV SOLN
500.0000 [IU] | Freq: Once | INTRAVENOUS | Status: AC | PRN
  Administered 2011-12-05: 500 [IU]
  Filled 2011-12-05: qty 5

## 2011-12-05 MED ORDER — SODIUM CHLORIDE 0.9 % IV SOLN
90.0000 mg/m2 | Freq: Once | INTRAVENOUS | Status: AC
  Administered 2011-12-05: 160 mg via INTRAVENOUS
  Filled 2011-12-05: qty 32

## 2011-12-06 ENCOUNTER — Ambulatory Visit (HOSPITAL_BASED_OUTPATIENT_CLINIC_OR_DEPARTMENT_OTHER): Payer: BC Managed Care – PPO

## 2011-12-06 VITALS — BP 145/74 | HR 81 | Temp 97.6°F

## 2011-12-06 DIAGNOSIS — C8590 Non-Hodgkin lymphoma, unspecified, unspecified site: Secondary | ICD-10-CM

## 2011-12-06 DIAGNOSIS — C8589 Other specified types of non-Hodgkin lymphoma, extranodal and solid organ sites: Secondary | ICD-10-CM

## 2011-12-06 MED ORDER — PEGFILGRASTIM INJECTION 6 MG/0.6ML
6.0000 mg | Freq: Once | SUBCUTANEOUS | Status: AC
  Administered 2011-12-06: 6 mg via SUBCUTANEOUS
  Filled 2011-12-06: qty 0.6

## 2011-12-09 ENCOUNTER — Other Ambulatory Visit: Payer: Self-pay | Admitting: Certified Registered Nurse Anesthetist

## 2011-12-10 ENCOUNTER — Encounter (HOSPITAL_COMMUNITY): Payer: Self-pay | Admitting: *Deleted

## 2011-12-10 ENCOUNTER — Emergency Department (INDEPENDENT_AMBULATORY_CARE_PROVIDER_SITE_OTHER)
Admission: EM | Admit: 2011-12-10 | Discharge: 2011-12-10 | Disposition: A | Discharge status: Home / Self Care | Payer: BC Managed Care – PPO | Source: Home / Self Care | Attending: Family Medicine | Admitting: Family Medicine

## 2011-12-10 ENCOUNTER — Emergency Department (INDEPENDENT_AMBULATORY_CARE_PROVIDER_SITE_OTHER): Payer: BC Managed Care – PPO

## 2011-12-10 DIAGNOSIS — R05 Cough: Secondary | ICD-10-CM

## 2011-12-10 LAB — CBC
HCT: 37 % (ref 36.0–46.0)
MCHC: 34.3 g/dL (ref 30.0–36.0)
MCV: 89.4 fL (ref 78.0–100.0)
RDW: 16.1 % — ABNORMAL HIGH (ref 11.5–15.5)

## 2011-12-10 LAB — DIFFERENTIAL
Basophils Absolute: 0 10*3/uL (ref 0.0–0.1)
Basophils Relative: 0 % (ref 0–1)
Eosinophils Relative: 3 % (ref 0–5)
Lymphocytes Relative: 6 % — ABNORMAL LOW (ref 12–46)
Monocytes Absolute: 0.4 10*3/uL (ref 0.1–1.0)

## 2011-12-10 MED ORDER — HYDROCOD POLST-CHLORPHEN POLST 10-8 MG/5ML PO LQCR: 5.0000 mL | Freq: Two times a day (BID) | ORAL | Status: DC | PRN

## 2011-12-10 NOTE — ED Provider Notes (Signed)
History     CSN: [id number]  Arrival date & time 12/10/11  0915   First MD Initiated Contact with Patient 12/10/11 1011      Chief Complaint  Patient presents with  . Sore Throat    (Consider location/radiation/quality/duration/timing/severity/associated sxs/prior treatment) HPI Comments: Victoria Wood presents for evaluation of 10 day history of productive cough. She denies any fever, nasal congestion, rhinorrhea, or other allergic symptoms. She does report a sore throat, with a cough. She reports, that she has just recently completed a course of chemotherapy for non-Hodgkin's lymphoma. She is recently relocated here from [place]. She reports, that this is her fourth round of non-Hodgkin's lymphoma. She's been treated by a local oncologist here. However, she has not establish care with a primary care provider yet.   Patient is a 61 y.o. female presenting with cough. The history is provided by the patient.  Cough This is a new problem. The current episode started more than 1 week ago. The problem occurs constantly. The problem has not changed since onset.The cough is productive of sputum. There has been no fever. Associated symptoms include sore throat. Pertinent negatives include no chest pain, no chills, no sweats, no shortness of breath and no wheezing. She has tried nothing for the symptoms. She is not a smoker.    Past Medical History  Diagnosis Date  . Gastric polyps     History of  . GERD (gastroesophageal reflux disease)   . HTN (hypertension)   . DM type 2 (diabetes mellitus, type 2)   . Asthma   . Non Hodgkin's lymphoma   . Cancer     hx of Rituxan in 07/2011  . Non-Hodgkin's lymphoma   . Anemia   . Blood transfusion   . Hyperlipidemia   . Complication of anesthesia     has woken up during surgery   . Pneumonia     hx of pneumonia     Past Surgical History  Procedure Date  . Portacath placement   . Upper gastrointestinal endoscopy 02/2011    gastric polyps x 2  . Neck  and thigh surgery     lymph nodes removed  . Esophagogastroduodenoscopy 08/22/2011    Procedure: ESOPHAGOGASTRODUODENOSCOPY (EGD);  Surgeon: Louis Meckel, MD;  Location: [facility];  Service: Endoscopy;  Laterality: N/A;  . Lymph node biopsy 10/02/11    left axillary    Family History  Problem Relation Age of Onset  . Hypertension Mother   . Cancer Mother     breast  . Hypertension Father   . Kidney disease Father   . Cancer Father     throat  . Cancer Maternal Grandmother     breast    History  Substance Use Topics  . Smoking status: Never Smoker   . Smokeless tobacco: Never Used  . Alcohol Use: 0.0 oz/week     Occasional    OB History    Grav Para Term Preterm Abortions TAB SAB Ect Mult Living                  Review of Systems  Constitutional: Negative.  Negative for fever and chills.  HENT: Positive for sore throat.   Eyes: Negative.   Respiratory: Positive for cough. Negative for shortness of breath and wheezing.   Cardiovascular: Negative.  Negative for chest pain.  Gastrointestinal: Negative.   Genitourinary: Negative.   Musculoskeletal: Negative.   Skin: Negative.   Neurological: Negative.     Allergies  Penicillins and  Shellfish allergy  Home Medications   Current Outpatient Rx  Name Route Sig Dispense Refill  . ACETAMINOPHEN 325 MG PO TABS Oral Take 325-650 mg by mouth every 6 (six) hours as needed. Pain     . ACYCLOVIR 400 MG PO TABS Oral Take 1 tablet (400 mg total) by mouth daily. 30 tablet 3  . ALBUTEROL SULFATE HFA 108 (90 BASE) MCG/ACT IN AERS Inhalation Inhale 2 puffs into the lungs every 6 (six) hours as needed. Wheezing     . ALLOPURINOL 300 MG PO TABS Oral Take 1 tablet (300 mg total) by mouth daily. 30 tablet 3  . ALPRAZOLAM 0.25 MG PO TABS Oral Take 0.25 mg by mouth 2 (two) times daily as needed. anxiety    . ANTIPYRINE-BENZOCAINE 54-14 MG/ML OT SOLN Right Ear Place 1 drop into the right ear once.    Marland Kitchen HYDROCOD POLST-CPM POLST  ER 10-8 MG/5ML PO LQCR Oral Take 5 mLs by mouth every 12 (twelve) hours as needed. 140 mL 0  . VITAMIN B 12 PO Oral Take 1 tablet by mouth daily.     Marland Kitchen DOCUSATE SODIUM 100 MG PO CAPS Oral Take 100 mg by mouth 2 (two) times daily.    Marland Kitchen HYPROMELLOSE 2.5 % OP SOLN Both Eyes Place 1 drop into both eyes daily.    Marland Kitchen LIDOCAINE-PRILOCAINE 2.5-2.5 % EX CREA Topical Apply topically as needed. 30 g 2  . METFORMIN HCL 500 MG PO TABS Oral Take 500 mg by mouth 2 (two) times daily as needed.     Marland Kitchen OMEPRAZOLE MAGNESIUM 20 MG PO TBEC Oral Take 1 tablet (20 mg total) by mouth 2 (two) times daily. 60 tablet 0  . ONDANSETRON HCL 8 MG PO TABS  Take 1 tablet two times a day starting the day after chemo for 2 days. Then take 1 tablet two times a day as needed for nausea or vomiting. 30 tablet 1  . OXYCODONE HCL 5 MG PO CAPS Oral Take 5 mg by mouth every 4 (four) hours as needed. Pain     . POLYETHYLENE GLYCOL 3350 PO PACK Oral Take 17 g by mouth daily.    Marland Kitchen PRILOSEC OTC 20 MG PO TBEC Oral Take 20 mg by mouth 2 (two) times daily.     Marland Kitchen PROCHLORPERAZINE MALEATE 10 MG PO TABS Oral Take 1 tablet (10 mg total) by mouth every 6 (six) hours as needed (Nausea or vomiting). 30 tablet 1  . PROMETHAZINE HCL 25 MG RE SUPP Rectal Place 1 suppository (25 mg total) rectally every 12 (twelve) hours as needed for nausea. 20 each 0  . RAMIPRIL 10 MG PO TABS Oral Take 20 mg by mouth daily after breakfast.     . SENNA 8.6 MG PO TABS Oral Take 2 tablets by mouth daily as needed. Constipation     . VENLAFAXINE HCL ER 75 MG PO CP24 Oral Take 75 mg by mouth daily after breakfast.     . ZOLPIDEM TARTRATE 5 MG PO TABS Oral Take 1 tablet (5 mg total) by mouth at bedtime as needed for sleep. 30 tablet 0    BP 131/78  Pulse 107  Temp(Src) 98.3 F (36.8 C) (Oral)  Resp 14  SpO2 99%  Physical Exam  Nursing note and vitals reviewed. Constitutional: She is oriented to person, place, and time. She appears well-developed and well-nourished.    HENT:  Head: Normocephalic and atraumatic.  Right Ear: Tympanic membrane normal.  Left Ear: Tympanic membrane normal.  Mouth/Throat: Uvula is midline, oropharynx is clear and moist and mucous membranes are normal.  Eyes: EOM are normal.  Neck: Normal range of motion.  Cardiovascular: Normal rate and regular rhythm.   Pulmonary/Chest: Effort normal. She has decreased breath sounds in the right middle field and the right lower field. She has no wheezes. She has no rhonchi.  Musculoskeletal: Normal range of motion.  Neurological: She is alert and oriented to person, place, and time.  Skin: Skin is warm and dry.  Psychiatric: Her behavior is normal.    ED Course  Procedures (including critical care time)  Labs Reviewed  CBC - Abnormal; Notable for the following:    WBC 13.8 (*)    RDW 16.1 (*)    All other components within normal limits  DIFFERENTIAL - Abnormal; Notable for the following:    Neutrophils Relative 88 (*)    Neutro Abs 12.1 (*)    Lymphocytes Relative 6 (*)    All other components within normal limits   Dg Chest 2 View  12/10/2011  *RADIOLOGY REPORT*  Clinical Data: Cough  CHEST - 2 VIEW  Comparison: 10/10/2011  Findings: Cardiomediastinal silhouette is stable.  Mild thoracic dextroscoliosis again noted.  Stable right IJ Port-A-Cath position. No acute infiltrate or pulmonary edema.  Right paratracheal adenopathy is stable.  Again noted surgical clips left axilla. Mild thoracic spine osteopenia  IMPRESSION: No active disease.  Stable right paratracheal adenopathy.  Original Report Authenticated By: Natasha Mead, M.D.     1. Cough       MDM  Xray reviewed by radiologist and myself; no acute findings; labs reviewed, reassuring blood counts; rx for hydrocodone cough syrup PRN; return precautions given       Renaee Munda, MD 12/10/11 1217

## 2011-12-10 NOTE — Progress Notes (Signed)
Spoke to husband this morning who called to say that patient went to Urgent Care with c/o coughing, and generally not feeling well; attempted to call patient several times, no answer and voicemail full; related this information back to husband, who stated that he will have patient to call office later.

## 2011-12-10 NOTE — Discharge Instructions (Signed)
Your x-ray was normal today. Also, your blood counts were reassuring, as well. This is likely a virus. We will treat the cough symptomatically. However, please return to care should, your symptoms not improve, worsen, or you develop fever.

## 2011-12-10 NOTE — ED Notes (Signed)
Pt  Has  Symptoms  Of  sorethroat  /  Cough  /  Congestion     X  10  Days   -  Pt   Takes  Chemo      She  thought she  Had  A  3  Day  Virus  Last  Week  But it  Has  Not  Cleared     -  She is  Awake  As  Well  As  Alert  And  Oriented       Skin is  Warm  And  Dry      She  Is  Pleasant  And  She  Is  Speaking  Clearly  And  In  Complete  sentances

## 2011-12-16 ENCOUNTER — Telehealth: Payer: Self-pay | Admitting: *Deleted

## 2011-12-16 NOTE — Telephone Encounter (Signed)
Husband called to verify/confirm pt's upcoming appointments at [facility].  Clarified w/ Dr. Gaylyn Rong to cancel chemo appt on books for 4/24.  He also instructs to make sure pt makes appt to see Dr. Greggory Stallion at [facility] asap after the PET scan is done.  Informed husband of this and confirmed upcoming appts.  He verbalized understanding.

## 2011-12-17 ENCOUNTER — Telehealth: Payer: Self-pay | Admitting: Oncology

## 2011-12-17 NOTE — Telephone Encounter (Signed)
called pts home lmovm that we moved her appt with Barb neff to 04/23

## 2011-12-28 ENCOUNTER — Encounter (HOSPITAL_COMMUNITY): Payer: Self-pay | Admitting: Emergency Medicine

## 2011-12-28 ENCOUNTER — Emergency Department (HOSPITAL_COMMUNITY)
Admission: EM | Admit: 2011-12-28 | Discharge: 2011-12-28 | Disposition: A | Discharge status: Home / Self Care | Payer: BC Managed Care – PPO | Attending: Emergency Medicine | Admitting: Emergency Medicine

## 2011-12-28 DIAGNOSIS — H9209 Otalgia, unspecified ear: Secondary | ICD-10-CM | POA: Insufficient documentation

## 2011-12-28 DIAGNOSIS — R070 Pain in throat: Secondary | ICD-10-CM | POA: Insufficient documentation

## 2011-12-28 DIAGNOSIS — K219 Gastro-esophageal reflux disease without esophagitis: Secondary | ICD-10-CM | POA: Insufficient documentation

## 2011-12-28 DIAGNOSIS — H6690 Otitis media, unspecified, unspecified ear: Secondary | ICD-10-CM | POA: Insufficient documentation

## 2011-12-28 DIAGNOSIS — R059 Cough, unspecified: Secondary | ICD-10-CM | POA: Insufficient documentation

## 2011-12-28 DIAGNOSIS — I10 Essential (primary) hypertension: Secondary | ICD-10-CM | POA: Insufficient documentation

## 2011-12-28 DIAGNOSIS — Z859 Personal history of malignant neoplasm, unspecified: Secondary | ICD-10-CM | POA: Insufficient documentation

## 2011-12-28 DIAGNOSIS — E119 Type 2 diabetes mellitus without complications: Secondary | ICD-10-CM | POA: Insufficient documentation

## 2011-12-28 DIAGNOSIS — E785 Hyperlipidemia, unspecified: Secondary | ICD-10-CM | POA: Insufficient documentation

## 2011-12-28 DIAGNOSIS — R05 Cough: Secondary | ICD-10-CM | POA: Insufficient documentation

## 2011-12-28 DIAGNOSIS — Z79899 Other long term (current) drug therapy: Secondary | ICD-10-CM | POA: Insufficient documentation

## 2011-12-28 DIAGNOSIS — J3489 Other specified disorders of nose and nasal sinuses: Secondary | ICD-10-CM | POA: Insufficient documentation

## 2011-12-28 MED ORDER — CEFDINIR 300 MG PO CAPS: 600.0000 mg | ORAL_CAPSULE | Freq: Two times a day (BID) | ORAL | Status: AC

## 2011-12-28 MED ORDER — FLUTICASONE PROPIONATE 50 MCG/ACT NA SUSP: 2.0000 | Freq: Every day | NASAL | Status: DC

## 2011-12-28 MED ORDER — HYDROCOD POLST-CHLORPHEN POLST 10-8 MG/5ML PO LQCR: 5.0000 mL | Freq: Two times a day (BID) | ORAL | Status: DC

## 2011-12-28 NOTE — ED Provider Notes (Signed)
Medical screening examination/treatment/procedure(s) were performed by non-physician practitioner and as supervising physician I was immediately available for consultation/collaboration.   [name], MD 12/28/11 [phone]

## 2011-12-28 NOTE — ED Provider Notes (Signed)
History     CSN: [id number]  Arrival date & time 12/28/11  0745   None     Chief Complaint  Patient presents with  . Otalgia    (Consider location/radiation/quality/duration/timing/severity/associated sxs/prior treatment) HPI Comments: Pt is currently immunocompromised.  She has NHL and undergoing chemotherapy.  She is unsure the exact chemotherapy drugs.  Patient is a 61 y.o. female presenting with ear pain. The history is provided by the patient and the spouse.  Otalgia This is a new problem. The current episode started 2 days ago. There is pain in the right ear. The problem occurs constantly. The problem has been gradually worsening. There has been no fever. The pain is moderate. Associated symptoms include rhinorrhea, sore throat and cough. Pertinent negatives include no ear discharge, no headaches, no hearing loss, no abdominal pain, no diarrhea, no vomiting, no neck pain and no rash.    Past Medical History  Diagnosis Date  . Gastric polyps     History of  . GERD (gastroesophageal reflux disease)   . HTN (hypertension)   . Asthma   . Non Hodgkin's lymphoma   . Anemia   . Hyperlipidemia   . Complication of anesthesia     has woken up during surgery   . Pneumonia     hx of pneumonia   . Blood transfusion   . Cancer     hx of Rituxan in 07/2011  . Non-Hodgkin's lymphoma   . DM type 2 (diabetes mellitus, type 2)     Past Surgical History  Procedure Date  . Portacath placement   . Upper gastrointestinal endoscopy 02/2011    gastric polyps x 2  . Neck and thigh surgery     lymph nodes removed  . Esophagogastroduodenoscopy 08/22/2011    Procedure: ESOPHAGOGASTRODUODENOSCOPY (EGD);  Surgeon: Louis Meckel, MD;  Location: [facility];  Service: Endoscopy;  Laterality: N/A;  . Lymph node biopsy 10/02/11    left axillary    Family History  Problem Relation Age of Onset  . Hypertension Mother   . Cancer Mother     breast  . Hypertension Father   . Kidney  disease Father   . Cancer Father     throat  . Cancer Maternal Grandmother     breast    History  Substance Use Topics  . Smoking status: Never Smoker   . Smokeless tobacco: Never Used  . Alcohol Use: No     l    OB History    Grav Para Term Preterm Abortions TAB SAB Ect Mult Living                  Review of Systems  HENT: Positive for ear pain, sore throat and rhinorrhea. Negative for hearing loss, neck pain and ear discharge.   Respiratory: Positive for cough.   Gastrointestinal: Negative for vomiting, abdominal pain and diarrhea.  Skin: Negative for rash.  Neurological: Negative for headaches.  All other systems reviewed and are negative.    Allergies  Penicillins and Shellfish allergy  Home Medications   Current Outpatient Rx  Name Route Sig Dispense Refill  . ACETAMINOPHEN 325 MG PO TABS Oral Take 325-650 mg by mouth every 6 (six) hours as needed. Pain     . ACYCLOVIR 400 MG PO TABS Oral Take 1 tablet (400 mg total) by mouth daily. 30 tablet 3  . ALBUTEROL SULFATE HFA 108 (90 BASE) MCG/ACT IN AERS Inhalation Inhale 2 puffs into the lungs every 6 (  six) hours as needed. Wheezing     . ALLOPURINOL 300 MG PO TABS Oral Take 1 tablet (300 mg total) by mouth daily. 30 tablet 3  . ALPRAZOLAM 0.25 MG PO TABS Oral Take 0.25 mg by mouth 2 (two) times daily as needed. anxiety    . ANTIPYRINE-BENZOCAINE 54-14 MG/ML OT SOLN Right Ear Place 1 drop into the right ear once.    . DOCUSATE SODIUM 100 MG PO CAPS Oral Take 100 mg by mouth 2 (two) times daily.    . GUAIFENESIN 100 MG/5ML PO LIQD Oral Take 200 mg by mouth 4 (four) times daily as needed. For cough    . HYPROMELLOSE 2.5 % OP SOLN Both Eyes Place 1 drop into both eyes daily.    Marland Kitchen LIDOCAINE-PRILOCAINE 2.5-2.5 % EX CREA Topical Apply topically as needed. 30 g 2  . MAGNESIUM CITRATE 1.745 GM/30ML PO SOLN Oral Take 1 Bottle by mouth once. constipation    . ONDANSETRON HCL 8 MG PO TABS  Take 1 tablet two times a day  starting the day after chemo for 2 days. Then take 1 tablet two times a day as needed for nausea or vomiting. 30 tablet 1  . OXYCODONE HCL 5 MG PO CAPS Oral Take 5 mg by mouth every 4 (four) hours as needed. Pain     . PSEUDOEPHEDRINE HCL ER 120 MG PO TB12 Oral Take 120 mg by mouth every 12 (twelve) hours. congestion    . SENNA 8.6 MG PO TABS Oral Take 2 tablets by mouth daily as needed. Constipation     . VENLAFAXINE HCL ER 75 MG PO CP24 Oral Take 75 mg by mouth daily after breakfast.     . ZOLPIDEM TARTRATE 5 MG PO TABS Oral Take 1 tablet (5 mg total) by mouth at bedtime as needed for sleep. 30 tablet 0  . CEFDINIR 300 MG PO CAPS Oral Take 2 capsules (600 mg total) by mouth 2 (two) times daily. 20 capsule 0  . HYDROCOD POLST-CPM POLST ER 10-8 MG/5ML PO LQCR Oral Take 5 mLs by mouth every 12 (twelve) hours. 115 mL 0  . FLUTICASONE PROPIONATE 50 MCG/ACT NA SUSP Nasal Place 2 sprays into the nose daily. 16 g 0  . OMEPRAZOLE MAGNESIUM 20 MG PO TBEC Oral Take 1 tablet (20 mg total) by mouth 2 (two) times daily. 60 tablet 0  . PROMETHAZINE HCL 25 MG RE SUPP Rectal Place 1 suppository (25 mg total) rectally every 12 (twelve) hours as needed for nausea. 20 each 0    BP 126/70  Pulse 124  Temp(Src) 98.1 F (36.7 C) (Oral)  Resp 20  SpO2 96%  Physical Exam  Nursing note and vitals reviewed. Constitutional: She is oriented to person, place, and time. She appears well-developed and well-nourished.  HENT:  Head: Normocephalic and atraumatic.  Right Ear: Tympanic membrane is erythematous and retracted.  Left Ear: Tympanic membrane is erythematous.  Nose: Nose normal.  Mouth/Throat: Oropharynx is clear and moist. No oropharyngeal exudate.       Rt>Lt TM  Eyes: Conjunctivae and EOM are normal. Pupils are equal, round, and reactive to light. Right eye exhibits no discharge. Left eye exhibits no discharge. No scleral icterus.  Neck: Normal range of motion. Neck supple.  Cardiovascular: Normal rate,  regular rhythm and normal heart sounds.   Pulmonary/Chest: Effort normal and breath sounds normal.  Abdominal: Soft. Bowel sounds are normal. There is no tenderness.  Musculoskeletal: Normal range of motion. She exhibits no edema  and no tenderness.  Lymphadenopathy:    She has no cervical adenopathy.  Neurological: She is alert and oriented to person, place, and time.  Skin: Skin is warm and dry.  Psychiatric: She has a normal mood and affect. Her behavior is normal.    ED Course  Procedures (including critical care time)  Labs Reviewed - No data to display No results found.   1. AOM (acute otitis media)       MDM  Discussed with pt that she is immunocompromised.  She is having no fevers.  Given abx.  Advised proper precautions at home.  Given Flonase for further sinus relief and Tussionex for cough.  Pt and husband compliant.  F/u with oncologist.        Madelaine Bhat, [place] 12/28/11 [phone]

## 2011-12-28 NOTE — ED Notes (Signed)
Pt reports B/L ear pain onset last night. Right ear hurts worse than left. Pt also reports having urinary frequency x 1 week.

## 2011-12-28 NOTE — Discharge Instructions (Signed)
Otitis Media, Adult  A middle ear infection is an infection in the space behind the eardrum. The medical name for this is "otitis media." It may happen after a common cold. It is caused by a germ that starts growing in that space. You may feel swollen glands in your neck on the side of the ear infection.  HOME CARE INSTRUCTIONS   · Take your medicine as directed until it is gone, even if you feel better after the first few days.  · Only take over-the-counter or prescription medicines for pain, discomfort, or fever as directed by your caregiver.  · Occasional use of a nasal decongestant a couple times per day may help with discomfort and help the eustachian tube to drain better.  Follow up with your caregiver in 10 to 14 days or as directed, to be certain that the infection has cleared. Not keeping the appointment could result in a chronic or permanent injury, pain, hearing loss and disability. If there is any problem keeping the appointment, you must call back to this facility for assistance.  SEEK IMMEDIATE MEDICAL CARE IF:   · You are not getting better in 2 to 3 days.  · You have pain that is not controlled with medication.  · You feel worse instead of better.  · You cannot use the medication as directed.  · You develop swelling, redness or pain around the ear or stiffness in your neck.  MAKE SURE YOU:   · Understand these instructions.  · Will watch your condition.  · Will get help right away if you are not doing well or get worse.  Document Released: 05/31/2004 Document Revised: 08/15/2011 Document Reviewed: 04/01/2008  ExitCare® Patient Information ©2012 [employer].

## 2011-12-30 ENCOUNTER — Encounter (HOSPITAL_COMMUNITY)
Admission: RE | Admit: 2011-12-30 | Discharge: 2011-12-30 | Disposition: A | Discharge status: Home / Self Care | Payer: BC Managed Care – PPO | Source: Ambulatory Visit | Attending: Oncology | Admitting: Oncology

## 2011-12-30 ENCOUNTER — Encounter (HOSPITAL_COMMUNITY): Payer: Self-pay

## 2011-12-30 DIAGNOSIS — C8589 Other specified types of non-Hodgkin lymphoma, extranodal and solid organ sites: Secondary | ICD-10-CM | POA: Insufficient documentation

## 2011-12-30 DIAGNOSIS — C8590 Non-Hodgkin lymphoma, unspecified, unspecified site: Secondary | ICD-10-CM

## 2011-12-30 LAB — GLUCOSE, CAPILLARY: Glucose-Capillary: 116 mg/dL — ABNORMAL HIGH (ref 70–99)

## 2011-12-30 MED ORDER — FLUDEOXYGLUCOSE F - 18 (FDG) INJECTION
17.6000 | Freq: Once | INTRAVENOUS | Status: AC | PRN
  Administered 2011-12-30: 17.6 via INTRAVENOUS

## 2011-12-31 ENCOUNTER — Encounter: Payer: BC Managed Care – PPO | Admitting: Nutrition

## 2011-12-31 ENCOUNTER — Other Ambulatory Visit (HOSPITAL_BASED_OUTPATIENT_CLINIC_OR_DEPARTMENT_OTHER): Payer: BC Managed Care – PPO | Admitting: Lab

## 2011-12-31 ENCOUNTER — Ambulatory Visit (HOSPITAL_BASED_OUTPATIENT_CLINIC_OR_DEPARTMENT_OTHER): Payer: BC Managed Care – PPO | Admitting: Oncology

## 2011-12-31 VITALS — BP 119/69 | HR 93 | Temp 97.2°F | Ht 66.0 in | Wt 137.4 lb

## 2011-12-31 DIAGNOSIS — C8589 Other specified types of non-Hodgkin lymphoma, extranodal and solid organ sites: Secondary | ICD-10-CM

## 2011-12-31 DIAGNOSIS — C8590 Non-Hodgkin lymphoma, unspecified, unspecified site: Secondary | ICD-10-CM

## 2011-12-31 DIAGNOSIS — K838 Other specified diseases of biliary tract: Secondary | ICD-10-CM

## 2011-12-31 DIAGNOSIS — D72819 Decreased white blood cell count, unspecified: Secondary | ICD-10-CM

## 2011-12-31 DIAGNOSIS — D6481 Anemia due to antineoplastic chemotherapy: Secondary | ICD-10-CM

## 2011-12-31 LAB — CBC WITH DIFFERENTIAL/PLATELET
Basophils Absolute: 0 10*3/uL (ref 0.0–0.1)
Eosinophils Absolute: 0.1 10*3/uL (ref 0.0–0.5)
HCT: 32.9 % — ABNORMAL LOW (ref 34.8–46.6)
HGB: 11.5 g/dL — ABNORMAL LOW (ref 11.6–15.9)
MONO#: 0.5 10*3/uL (ref 0.1–0.9)
NEUT#: 2.5 10*3/uL (ref 1.5–6.5)
NEUT%: 76.4 % (ref 38.4–76.8)
RDW: 16.7 % — ABNORMAL HIGH (ref 11.2–14.5)
lymph#: 0.2 10*3/uL — ABNORMAL LOW (ref 0.9–3.3)

## 2011-12-31 LAB — COMPREHENSIVE METABOLIC PANEL
Albumin: 3.9 g/dL (ref 3.5–5.2)
Alkaline Phosphatase: 99 U/L (ref 39–117)
BUN: 14 mg/dL (ref 6–23)
CO2: 31 mEq/L (ref 19–32)
Calcium: 9.6 mg/dL (ref 8.4–10.5)
Chloride: 103 mEq/L (ref 96–112)
Glucose, Bld: 130 mg/dL — ABNORMAL HIGH (ref 70–99)
Potassium: 4.6 mEq/L (ref 3.5–5.3)
Sodium: 142 mEq/L (ref 135–145)
Total Protein: 5.7 g/dL — ABNORMAL LOW (ref 6.0–8.3)

## 2011-12-31 NOTE — Progress Notes (Signed)
[facility] END OF TREATMENT   Name: Victoria Wood Date: 12/31/2011 MRN: [id number] DOB: [birth year]   TREATMENT DATES:    REFERRING PHYSICIAN: No ref. provider found   DIAGNOSIS: The encounter diagnosis was Non-Hodgkin's lymphoma.   STAGE AT START OF TREATMENT:  From 10/09/2011 to 12/06/2011.    INTENT: curative   DRUGS OR REGIMENS GIVEN:  Bendamustine/Rituxan   MAJOR TOXICITIES: grade 1 fatigue; grade 1 anorexia/weight loss;  grade 1 leukopenia; grade 1 anemia.    REASON TREATMENT STOPPED:  Finish of planned treatment.    PERFORMANCE STATUS AT END:  ECOG 1   ONGOING PROBLEMS:  Seasonal allergy; grade 1 anorexia and weight loss.    FOLLOW UP PLANS:  Appointment with Dr. Marlaine Hind, [facility] B[facility] this week to finalize plan for BMT.

## 2011-12-31 NOTE — Progress Notes (Unsigned)
Faxed copy of

## 2011-12-31 NOTE — Patient Instructions (Signed)
A.  PET scan 12/30/2011.  Clinical Data: Subsequent treatment strategy for non-Hodgkins  lymphoma.  NUCLEAR MEDICINE PET SKULL BASE TO THIGH  Fasting Blood Glucose: 116  Technique: 17.6 mCi F-18 FDG was injected intravenously. CT data  was obtained and used for attenuation correction and anatomic  localization only. (This was not acquired as a diagnostic CT  examination.) Additional exam technical data entered on  technologist worksheet.  Comparison: 09/23/2011  Findings:  Neck: No hypermetabolic lymph nodes in the neck. Incidental note  is made of bilateral maxillary sinus air fluid levels.  Chest: No hypermetabolic mediastinal or hilar nodes. No  suspicious pulmonary nodules on the CT scan.  Abdomen/Pelvis: No abnormal hypermetabolic activity within the  liver, pancreas, adrenal glands, or spleen. No hypermetabolic  lymph nodes in the abdomen or pelvis.  [place]: No focal hypermetabolic activity to suggest skeletal  metastasis.  IMPRESSION:  1. Complete metabolic response to therapy. No residual  metabolically active disease identified.  2. Bilateral maxillary sinus air fluid levels incidentally noted;  recommend clinical correlation for sinusitis.  B.  Future treatment plan: - See Dr. Greggory Stallion next week to finalize bone marrow transplant.

## 2011-12-31 NOTE — Progress Notes (Signed)
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   PAST THERAPY:  She was diagnosed with non-Hodgkin lymphoma (Follicular Stage IV) when she was 62 year old. She was successfully treated with chemotherapy and remained disease free for 29 years. She does not remember exact chemoregimen since that oncology practice is no longer in service. In 2008, she was found to have recurrent stage IV follicular disease which she underwent chemotherapy (FND-Fludarabine/Mitoxantrone/Dex?) and was in remission. Since then, Dr. Parks Ranger [phone]) has been following her with biannually PET scan. In June 2012, she had small shotty adenopathy in the neck, chest, and abdomen. Decision was for watchful observation. In October 2012, her B symptoms came back and after evaluated and confirmed by Dr. Parks Ranger with progressive disease, she received single agent weekly Rituximab x 5 doses with last dose on11/21/2012. She has since transferred her care to [place], [place].   CURRENT THERAPY: started salvage Bendamustine/Rituxan on 10/09/2011 before R-ICE chemo prior to autologous BMT.    INTERVAL HISTORY: Victoria Wood 61 y.o. female returns for regular follow up with her husband and two daughters to go over her restaging PET scan after 3 cycles of BR.  For the last 4 weeks, she has been having nasal congestion, headache, right ear fullness and not able to hear out from the right ear.  She had history of seasonal allergy but it has been bad this year.  She denies pain, purulent discharge or bleeding from the right ear.  She went to ED this past weekend and was given a course of antibiotic Cefdinir which she still has 2 more days.  Being on antibiotic, her symptoms have significantly improved.  In term of her lymphoma, she  denies palpable node swelling, abdominal pain, right leg swelling.  Chemo has altered her taste, and thus she has very low appetite.  She has lost a few more pounds compared to last month.  She has mild fatigue; however, she is independent of all activities of daily living.   Patient denies visual changes, confusion, drenching night sweats, mucositis, odynophagia, dysphagia, nausea vomiting, jaundice, chest pain, palpitation, shortness of breath, dyspnea on exertion, productive cough, gum bleeding, epistaxis, hematemesis, hemoptysis, abdominal pain, abdominal swelling, early satiety, melena, hematochezia, hematuria, skin rash, spontaneous bleeding, joint swelling, joint pain, heat or cold intolerance, bowel bladder incontinence, back pain, focal motor weakness, paresthesia, depression, suicidal or homocidal ideation, feeling hopelessness.   Past Medical History  Diagnosis Date  . Gastric polyps     History of  . GERD (gastroesophageal reflux disease)   . HTN (hypertension)   . Asthma   . Non Hodgkin's lymphoma   . Anemia   . Hyperlipidemia   . Complication of anesthesia     has woken up during surgery   . Pneumonia     hx of pneumonia   . Blood transfusion   . Cancer     hx of Rituxan in 07/2011  . Non-Hodgkin's lymphoma   . DM type 2 (diabetes mellitus, type 2)     Past Surgical History  Procedure Date  . Portacath placement   . Upper gastrointestinal endoscopy 02/2011    gastric polyps x 2  . Neck and thigh surgery     lymph nodes removed  . Esophagogastroduodenoscopy 08/22/2011    Procedure: ESOPHAGOGASTRODUODENOSCOPY (EGD);  Surgeon: Barbette Hair  Arlyce Dice, MD;  Location: [facility];  Service: Endoscopy;  Laterality: N/A;  . Lymph node biopsy 10/02/11    left axillary    Current Outpatient Prescriptions  Medication Sig Dispense Refill  . acetaminophen (TYLENOL) 325 MG tablet Take 325-650 mg by mouth every 6 (six) hours as needed. Pain       . acyclovir (ZOVIRAX) 400 MG tablet  Take 1 tablet (400 mg total) by mouth daily.  30 tablet  3  . albuterol (PROVENTIL HFA;VENTOLIN HFA) 108 (90 BASE) MCG/ACT inhaler Inhale 2 puffs into the lungs every 6 (six) hours as needed. Wheezing       . allopurinol (ZYLOPRIM) 300 MG tablet Take 1 tablet (300 mg total) by mouth daily.  30 tablet  3  . ALPRAZolam (XANAX) 0.25 MG tablet Take 0.25 mg by mouth 2 (two) times daily as needed. anxiety      . Antipyrine-Benzocaine (AURALGAN) 54-14 MG/ML SOLN Place 1 drop into the right ear once.      . cefdinir (OMNICEF) 300 MG capsule Take 2 capsules (600 mg total) by mouth 2 (two) times daily.  20 capsule  0  . chlorpheniramine-HYDROcodone (TUSSIONEX PENNKINETIC ER) 10-8 MG/5ML LQCR Take 5 mLs by mouth every 12 (twelve) hours.  115 mL  0  . docusate sodium (COLACE) 100 MG capsule Take 100 mg by mouth 2 (two) times daily.      . fluticasone (FLONASE) 50 MCG/ACT nasal spray Place 2 sprays into the nose daily.  16 g  0  . guaiFENesin (ROBITUSSIN) 100 MG/5ML liquid Take 200 mg by mouth 4 (four) times daily as needed. For cough      . hydroxypropyl methylcellulose (ISOPTO TEARS) 2.5 % ophthalmic solution Place 1 drop into both eyes daily.      Marland Kitchen lidocaine-prilocaine (EMLA) cream Apply topically as needed.  30 g  2  . magnesium citrate 296 ml SOLN Take 1 Bottle by mouth once. constipation      . omeprazole (PRILOSEC OTC) 20 MG tablet Take 1 tablet (20 mg total) by mouth 2 (two) times daily.  60 tablet  0  . ondansetron (ZOFRAN) 8 MG tablet Take 1 tablet two times a day starting the day after chemo for 2 days. Then take 1 tablet two times a day as needed for nausea or vomiting.  30 tablet  1  . oxycodone (OXY-IR) 5 MG capsule Take 5 mg by mouth every 4 (four) hours as needed. Pain       . promethazine (PHENERGAN) 25 MG suppository Place 1 suppository (25 mg total) rectally every 12 (twelve) hours as needed for nausea.  20 each  0  . pseudoephedrine (SUDAFED) 120 MG 12 hr tablet Take 120 mg by mouth every  12 (twelve) hours. congestion      . senna (SENOKOT) 8.6 MG TABS Take 2 tablets by mouth daily as needed. Constipation       . venlafaxine (EFFEXOR-XR) 75 MG 24 hr capsule Take 75 mg by mouth daily after breakfast.       . zolpidem (AMBIEN) 5 MG tablet Take 1 tablet (5 mg total) by mouth at bedtime as needed for sleep.  30 tablet  0    ALLERGIES:  is allergic to penicillins and shellfish allergy.  REVIEW OF SYSTEMS:  The rest of the 14-point review of system was negative.   Filed Vitals:   12/31/11 0834  BP: 119/69  Pulse: 93  Temp: 97.2 F (36.2 C)   Wt Readings from Last  3 Encounters:  12/31/11 137 lb 6.4 oz (62.324 kg)  12/03/11 142 lb 4.8 oz (64.547 kg)  11/25/11 140 lb 8 oz (63.73 kg)   ECOG Performance status: 1  PHYSICAL EXAMINATION:   General: thin-appearing woman in no acute distress. Eyes: no scleral icterus. ENT: There were no oropharyngeal lesions. There was bilateral middle auditory canals erythema without pain to bilateral external nears. Neck was without thyromegaly. Lymphatics:  There was no cervical/axilary/supraclavicular,inguinal adenopathy. Respiratory: lungs were clear bilaterally without wheezing or crackles. Cardiovascular: Regular rate and rhythm, S1/S2, without murmur, rub or gallop. The right leg edema has completely resolved. GI: abdomen was soft, flat, nontender, nondistended, without organomegaly. I could no longer feel any abdominal mass.  Muscoloskeletal: no spinal tenderness of palpation of vertebral spine. Skin exam was without echymosis, petichae. Neuro exam was nonfocal. Patient was able to get on and off exam table without assistance. Gait was normal. Patient was alerted and oriented. Attention was good. Language was appropriate. Mood was normal without depression. Speech was not pressured. Thought content was not tangential.       LABORATORY/RADIOLOGY DATA:  Lab Results  Component Value Date   WBC 3.2* 12/31/2011   HGB 11.5* 12/31/2011   HCT  32.9* 12/31/2011   PLT 228 12/31/2011   GLUCOSE 117* 12/03/2011   ALKPHOS 99 12/03/2011   ALT 18 12/03/2011   AST 20 12/03/2011   NA 143 12/03/2011   K 4.1 12/03/2011   CL 104 12/03/2011   CREATININE 0.75 12/03/2011   BUN 7 12/03/2011   CO2 31 12/03/2011   HGBA1C 5.8* 09/10/2011   IMAGING:  I personally reviewed the following PET scan and showed the images to the patient and her relatives.  In brief, there was complete radiographic response.   Nm Pet Image Restag (ps) Skull Base To Thigh  12/30/2011  *RADIOLOGY REPORT*  Clinical Data: Subsequent treatment strategy for non-Hodgkins lymphoma.  NUCLEAR MEDICINE PET SKULL BASE TO THIGH  Fasting Blood Glucose:  116  Technique:  17.6 mCi F-18 FDG was injected intravenously. CT data was obtained and used for attenuation correction and anatomic localization only.  (This was not acquired as a diagnostic CT examination.) Additional exam technical data entered on technologist worksheet.  Comparison:  09/23/2011  Findings:  Neck: No hypermetabolic lymph nodes in the neck.  Incidental note is made of bilateral maxillary sinus air fluid levels.  Chest:  No hypermetabolic mediastinal or hilar nodes.  No suspicious pulmonary nodules on the CT scan.  Abdomen/Pelvis:  No abnormal hypermetabolic activity within the liver, pancreas, adrenal glands, or spleen.  No hypermetabolic lymph nodes in the abdomen or pelvis.  Skel[place]:  No focal hypermetabolic activity to suggest skeletal metastasis.  IMPRESSION:  1.  Complete metabolic response to therapy.  No residual metabolically active disease identified. 2.  Bilateral maxillary sinus air fluid levels incidentally noted; recommend clinical correlation for sinusitis.  Original Report Authenticated By: Danae Orleans, M.D.     ASSESSMENT AND PLAN:   1. GERD: She is on Prilosec. Her symptoms have improved.   2. HTN: She has lost weight; and thus has had normal BP being off of ramipril.   3. DM, type II: She was on Metformin but is  now on diet control.   4. Depression, anxiety: She is on Effexor XR.   5. Seasonal allergy:  There was no evidence of purulent discharge on her ENT exam today.  Her symptoms are most consistent with seasonal allergy.  She has two more  days of antibiotics which I encouraged her to complete.   6. Intermittent cholestatis: From recurrent lymphoma.  Recent work up did not show evidence of biliary disease.   7. 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  She tolerated chemotherapy Bendamustine/Rituxan x 3 cycles with restaging PET scan showing complete radiographic response.  She has appointment with Dr. Greggory Stallion, BMT at [facility] later this week.  She and her husband questioned whether BMT is indicated at this time.  I expressed my concern that this is her 2nd recurrence and after each recurrence, her strength has seemed to decline.  Also, each subsequent remission has been shorter than the previous one.  He is 61 year-old now and has achieved complete radiographic response after 3 cycles of Bendamustine/Rituxan.  I am concerned that without autologous hematopoetic stem cell transplant, the next relapse may be more difficult to bring into remission and transplant will have lower chance of cure.  However, I defer the final decision to Dr. Greggory Stallion.  - Discontinue allopurinol since there is no risk of tumor lysis syndrome now.   I advised her to continue Acyclovir until her leukopenia improves and advised by Dr. Greggory Stallion to stop.  - Follow up with Dr. Greggory Stallion this week for pretransplant work up. - I can perform a restaging bone marrow biopsy and give RICE here at [facility] if Dr. Greggory Stallion wants [place] to.  - I will see patient in June 2013 after BMT.   8.  Slight leukopenia and anemia from recent chemo:  Most likely will improve as she is further out from chemo.    The length of time of the face-to-face encounter was 25  minutes. More than 50% of time was spent  counseling and coordination of care.

## 2011-12-31 NOTE — Progress Notes (Signed)
Faxed copy of PET-scan to [facility] (attention: Kriste Basque @ [phone]); patient also to pick up PET-scan on CD from [facility].

## 2012-01-01 ENCOUNTER — Telehealth: Payer: Self-pay | Admitting: Oncology

## 2012-01-01 ENCOUNTER — Encounter: Payer: BC Managed Care – PPO | Admitting: Nutrition

## 2012-01-01 ENCOUNTER — Ambulatory Visit: Payer: BC Managed Care – PPO

## 2012-01-01 NOTE — Telephone Encounter (Signed)
called pt lmovm for appt on 06/03 asked pt to rtn call to confirm appt

## 2012-01-02 ENCOUNTER — Telehealth: Payer: Self-pay | Admitting: Nutrition

## 2012-01-02 NOTE — Telephone Encounter (Signed)
Called patient to follow up on nutrition secondary to patient's continued weight loss and decreased oral intake with poor appetite.  I left a message for patient to call me if I can be of further assistance.

## 2012-01-09 ENCOUNTER — Telehealth: Payer: Self-pay | Admitting: *Deleted

## 2012-01-09 DIAGNOSIS — C8590 Non-Hodgkin lymphoma, unspecified, unspecified site: Secondary | ICD-10-CM

## 2012-01-09 NOTE — Telephone Encounter (Signed)
Received a VM from Gardiner Coins at [facility],  Pt in hospital for cytoxan/rituxan. States pt need labs done next week on Mon, Wed and Fri to include CBC, BMP, Mag on Mon and  CBC on Wed and Fri.  She will be faxing [place] the orders.  Also informs pt getting new Hickman catheter and starting on Neupogen daily (which pt will self administer at home).  Pt's Collection date for stem cells is 5/13.  Dr. Gaylyn Rong notified.   POF to scheduling.  I called pt and left VM informing her of times for labs next week.

## 2012-01-10 ENCOUNTER — Telehealth: Payer: Self-pay | Admitting: Oncology

## 2012-01-10 ENCOUNTER — Other Ambulatory Visit: Payer: Self-pay | Admitting: Oncology

## 2012-01-10 ENCOUNTER — Other Ambulatory Visit: Payer: Self-pay | Admitting: *Deleted

## 2012-01-10 ENCOUNTER — Encounter: Payer: Self-pay | Admitting: Oncology

## 2012-01-10 DIAGNOSIS — C8590 Non-Hodgkin lymphoma, unspecified, unspecified site: Secondary | ICD-10-CM

## 2012-01-10 DIAGNOSIS — Z9481 Bone marrow transplant status: Secondary | ICD-10-CM

## 2012-01-10 HISTORY — DX: Bone marrow transplant status: Z94.81

## 2012-01-10 NOTE — Telephone Encounter (Signed)
called pts home lmovm for appts on 05/06-05/13 ad to pick up a scheduled at that time

## 2012-01-10 NOTE — Progress Notes (Signed)
Received orders from Gardiner Coins, NP at [facility] BMT [phone]) and forwarded to Dr. Gaylyn Rong.  Orders include labs as already ordered and daily Neupogen 600 mcg 5/06 thru 5/12.  POF and message left for scheduler for injection appts.  Pt received Cytoxan/ Rituxan at [facility] this week.

## 2012-01-13 ENCOUNTER — Other Ambulatory Visit: Payer: Self-pay | Admitting: Oncology

## 2012-01-13 ENCOUNTER — Encounter: Payer: Self-pay | Admitting: Oncology

## 2012-01-13 ENCOUNTER — Other Ambulatory Visit (HOSPITAL_BASED_OUTPATIENT_CLINIC_OR_DEPARTMENT_OTHER): Payer: BC Managed Care – PPO | Admitting: Lab

## 2012-01-13 ENCOUNTER — Ambulatory Visit (HOSPITAL_BASED_OUTPATIENT_CLINIC_OR_DEPARTMENT_OTHER): Payer: BC Managed Care – PPO

## 2012-01-13 VITALS — BP 130/71 | HR 127 | Temp 98.3°F

## 2012-01-13 DIAGNOSIS — C8590 Non-Hodgkin lymphoma, unspecified, unspecified site: Secondary | ICD-10-CM

## 2012-01-13 DIAGNOSIS — C8589 Other specified types of non-Hodgkin lymphoma, extranodal and solid organ sites: Secondary | ICD-10-CM

## 2012-01-13 DIAGNOSIS — Z9481 Bone marrow transplant status: Secondary | ICD-10-CM

## 2012-01-13 DIAGNOSIS — D6481 Anemia due to antineoplastic chemotherapy: Secondary | ICD-10-CM

## 2012-01-13 HISTORY — DX: Hypomagnesemia: E83.42

## 2012-01-13 LAB — CBC WITH DIFFERENTIAL/PLATELET
Eosinophils Absolute: 0 10*3/uL (ref 0.0–0.5)
MCV: 92.3 fL (ref 79.5–101.0)
MONO%: 0.9 % (ref 0.0–14.0)
NEUT#: 7.6 10*3/uL — ABNORMAL HIGH (ref 1.5–6.5)
RBC: 3.29 10*6/uL — ABNORMAL LOW (ref 3.70–5.45)
RDW: 15.4 % — ABNORMAL HIGH (ref 11.2–14.5)
WBC: 7.8 10*3/uL (ref 3.9–10.3)
lymph#: 0.1 10*3/uL — ABNORMAL LOW (ref 0.9–3.3)
nRBC: 0 % (ref 0–0)

## 2012-01-13 LAB — BASIC METABOLIC PANEL
CO2: 28 mEq/L (ref 19–32)
Calcium: 8.6 mg/dL (ref 8.4–10.5)
Potassium: 3.7 mEq/L (ref 3.5–5.3)
Sodium: 142 mEq/L (ref 135–145)

## 2012-01-13 LAB — MAGNESIUM: Magnesium: 1.3 mg/dL — ABNORMAL LOW (ref 1.5–2.5)

## 2012-01-13 MED ORDER — MAGNESIUM OXIDE 400 (241.3 MG) MG PO TABS: 400.0000 mg | ORAL_TABLET | Freq: Two times a day (BID) | ORAL | Status: DC

## 2012-01-13 MED ORDER — SODIUM CHLORIDE 0.9 % IJ SOLN
10.0000 mL | INTRAMUSCULAR | Status: DC | PRN
  Administered 2012-01-13: 10 mL via INTRAVENOUS
  Filled 2012-01-13: qty 10

## 2012-01-13 MED ORDER — HEPARIN SOD (PORK) LOCK FLUSH 100 UNIT/ML IV SOLN
500.0000 [IU] | Freq: Once | INTRAVENOUS | Status: AC
  Administered 2012-01-13: 500 [IU] via INTRAVENOUS
  Filled 2012-01-13: qty 5

## 2012-01-13 MED ORDER — FILGRASTIM 300 MCG/0.5ML IJ SOLN
600.0000 ug | Freq: Every day | INTRAMUSCULAR | Status: DC
  Administered 2012-01-13: 300 ug via SUBCUTANEOUS
  Filled 2012-01-13: qty 1

## 2012-01-14 ENCOUNTER — Other Ambulatory Visit: Payer: Self-pay | Admitting: Oncology

## 2012-01-14 ENCOUNTER — Other Ambulatory Visit: Payer: Self-pay

## 2012-01-14 ENCOUNTER — Ambulatory Visit (HOSPITAL_BASED_OUTPATIENT_CLINIC_OR_DEPARTMENT_OTHER): Payer: BC Managed Care – PPO

## 2012-01-14 VITALS — BP 95/64 | HR 120 | Temp 99.2°F

## 2012-01-14 DIAGNOSIS — Z9481 Bone marrow transplant status: Secondary | ICD-10-CM

## 2012-01-14 DIAGNOSIS — Z5189 Encounter for other specified aftercare: Secondary | ICD-10-CM

## 2012-01-14 MED ORDER — FILGRASTIM 300 MCG/0.5ML IJ SOLN
600.0000 ug | Freq: Every day | INTRAMUSCULAR | Status: DC
  Administered 2012-01-14: 600 ug via SUBCUTANEOUS
  Filled 2012-01-14: qty 1

## 2012-01-14 NOTE — Progress Notes (Signed)
Faxed CBC lab results to Dr. Marlaine Hind @ [facility].

## 2012-01-15 ENCOUNTER — Ambulatory Visit (HOSPITAL_BASED_OUTPATIENT_CLINIC_OR_DEPARTMENT_OTHER): Payer: BC Managed Care – PPO

## 2012-01-15 ENCOUNTER — Other Ambulatory Visit (HOSPITAL_BASED_OUTPATIENT_CLINIC_OR_DEPARTMENT_OTHER): Payer: BC Managed Care – PPO | Admitting: Lab

## 2012-01-15 VITALS — BP 105/65 | HR 120 | Temp 97.7°F

## 2012-01-15 DIAGNOSIS — C8590 Non-Hodgkin lymphoma, unspecified, unspecified site: Secondary | ICD-10-CM

## 2012-01-15 DIAGNOSIS — D702 Other drug-induced agranulocytosis: Secondary | ICD-10-CM

## 2012-01-15 DIAGNOSIS — C8589 Other specified types of non-Hodgkin lymphoma, extranodal and solid organ sites: Secondary | ICD-10-CM

## 2012-01-15 DIAGNOSIS — Z9481 Bone marrow transplant status: Secondary | ICD-10-CM

## 2012-01-15 LAB — CBC WITH DIFFERENTIAL/PLATELET
Basophils Absolute: 0 10*3/uL (ref 0.0–0.1)
Eosinophils Absolute: 0.1 10*3/uL (ref 0.0–0.5)
HGB: 11.1 g/dL — ABNORMAL LOW (ref 11.6–15.9)
NEUT#: 0.1 10*3/uL — CL (ref 1.5–6.5)
RDW: 13.3 % (ref 11.2–14.5)
lymph#: 0.1 10*3/uL — ABNORMAL LOW (ref 0.9–3.3)

## 2012-01-15 MED ORDER — FILGRASTIM 300 MCG/0.5ML IJ SOLN
600.0000 ug | Freq: Once | INTRAMUSCULAR | Status: AC
  Administered 2012-01-15: 600 ug via SUBCUTANEOUS
  Filled 2012-01-15: qty 1

## 2012-01-16 ENCOUNTER — Other Ambulatory Visit: Payer: Self-pay | Admitting: Oncology

## 2012-01-16 ENCOUNTER — Ambulatory Visit (HOSPITAL_BASED_OUTPATIENT_CLINIC_OR_DEPARTMENT_OTHER): Payer: BC Managed Care – PPO

## 2012-01-16 VITALS — BP 100/61 | HR 98 | Temp 97.3°F

## 2012-01-16 DIAGNOSIS — C8590 Non-Hodgkin lymphoma, unspecified, unspecified site: Secondary | ICD-10-CM

## 2012-01-16 DIAGNOSIS — D702 Other drug-induced agranulocytosis: Secondary | ICD-10-CM

## 2012-01-16 DIAGNOSIS — Z9481 Bone marrow transplant status: Secondary | ICD-10-CM

## 2012-01-16 MED ORDER — FILGRASTIM 480 MCG/0.8ML IJ SOLN: 600.0000 ug | Freq: Once | INTRAMUSCULAR | Status: DC

## 2012-01-16 MED ORDER — FILGRASTIM 300 MCG/0.5ML IJ SOLN
600.0000 ug | Freq: Once | INTRAMUSCULAR | Status: AC
  Administered 2012-01-16: 600 ug via SUBCUTANEOUS
  Filled 2012-01-16: qty 1

## 2012-01-16 NOTE — Patient Instructions (Signed)
Call MD with any problems 

## 2012-01-17 ENCOUNTER — Other Ambulatory Visit (HOSPITAL_BASED_OUTPATIENT_CLINIC_OR_DEPARTMENT_OTHER): Payer: BC Managed Care – PPO | Admitting: Lab

## 2012-01-17 ENCOUNTER — Other Ambulatory Visit: Payer: Self-pay | Admitting: Oncology

## 2012-01-17 ENCOUNTER — Ambulatory Visit (HOSPITAL_BASED_OUTPATIENT_CLINIC_OR_DEPARTMENT_OTHER): Payer: BC Managed Care – PPO

## 2012-01-17 VITALS — BP 112/65 | HR 118 | Temp 97.6°F

## 2012-01-17 DIAGNOSIS — C8589 Other specified types of non-Hodgkin lymphoma, extranodal and solid organ sites: Secondary | ICD-10-CM

## 2012-01-17 DIAGNOSIS — D702 Other drug-induced agranulocytosis: Secondary | ICD-10-CM

## 2012-01-17 DIAGNOSIS — C8590 Non-Hodgkin lymphoma, unspecified, unspecified site: Secondary | ICD-10-CM

## 2012-01-17 DIAGNOSIS — Z9481 Bone marrow transplant status: Secondary | ICD-10-CM

## 2012-01-17 LAB — CBC WITH DIFFERENTIAL/PLATELET
Eosinophils Absolute: 0.1 10*3/uL (ref 0.0–0.5)
HCT: 28.3 % — ABNORMAL LOW (ref 34.8–46.6)
HGB: 10.1 g/dL — ABNORMAL LOW (ref 11.6–15.9)
LYMPH%: 46.4 % (ref 14.0–49.7)
MONO#: 0 10*3/uL — ABNORMAL LOW (ref 0.1–0.9)
NEUT#: 0 10*3/uL — CL (ref 1.5–6.5)
NEUT%: 10.7 % — ABNORMAL LOW (ref 38.4–76.8)
Platelets: 34 10*3/uL — ABNORMAL LOW (ref 145–400)
WBC: 0.3 10*3/uL — CL (ref 3.9–10.3)
nRBC: 0 % (ref 0–0)

## 2012-01-17 MED ORDER — FILGRASTIM 300 MCG/0.5ML IJ SOLN
600.0000 ug | Freq: Once | INTRAMUSCULAR | Status: AC
  Administered 2012-01-17: 600 ug via SUBCUTANEOUS
  Filled 2012-01-17: qty 1

## 2012-01-18 ENCOUNTER — Ambulatory Visit (HOSPITAL_BASED_OUTPATIENT_CLINIC_OR_DEPARTMENT_OTHER): Payer: BC Managed Care – PPO

## 2012-01-18 ENCOUNTER — Ambulatory Visit: Payer: BC Managed Care – PPO

## 2012-01-18 VITALS — BP 98/56 | HR 125 | Temp 97.9°F

## 2012-01-18 DIAGNOSIS — Z9481 Bone marrow transplant status: Secondary | ICD-10-CM

## 2012-01-18 DIAGNOSIS — Z5189 Encounter for other specified aftercare: Secondary | ICD-10-CM

## 2012-01-18 MED ORDER — FILGRASTIM 480 MCG/0.8ML IJ SOLN
600.0000 ug | Freq: Once | INTRAMUSCULAR | Status: AC
  Administered 2012-01-18: 600 ug via SUBCUTANEOUS

## 2012-01-20 ENCOUNTER — Ambulatory Visit: Payer: BC Managed Care – PPO

## 2012-01-20 ENCOUNTER — Telehealth: Payer: Self-pay | Admitting: Oncology

## 2012-01-20 ENCOUNTER — Telehealth: Payer: Self-pay | Admitting: *Deleted

## 2012-01-20 ENCOUNTER — Other Ambulatory Visit: Payer: Self-pay | Admitting: Oncology

## 2012-01-20 DIAGNOSIS — C8590 Non-Hodgkin lymphoma, unspecified, unspecified site: Secondary | ICD-10-CM

## 2012-01-20 DIAGNOSIS — Z9484 Stem cells transplant status: Secondary | ICD-10-CM

## 2012-01-20 NOTE — Telephone Encounter (Signed)
VM from [facility] states pt was scheduled to have Stem Cell Collection today, but her WBC is too low.  Pt is scheduled to return there on Thurs 5/16.  She will need to get Neupogen 600 mcg here daily on Tues and Wed 5/14 and 5/15 along w/ labs on 5/15.   Faxed orders received and forwarded to Dr. Gaylyn Rong.  POF to scheduling for injection and lab appts.   [facility] [phone].  [phone].

## 2012-01-20 NOTE — Telephone Encounter (Signed)
Husband called to confirm pt has appts for Neupogen tomorrow and Wed,  Confirmed dates/times.

## 2012-01-20 NOTE — Telephone Encounter (Signed)
Added appts for 5/14 and 5/15. S/w pt she is aware.

## 2012-01-21 ENCOUNTER — Other Ambulatory Visit: Payer: Self-pay | Admitting: Oncology

## 2012-01-21 ENCOUNTER — Ambulatory Visit: Payer: BC Managed Care – PPO

## 2012-01-21 NOTE — Progress Notes (Signed)
Received call from [facility] @ [facility] [phone]), stating that patient call last night with c/o increased fever; patient is admitted to hospital on IV antibiotics and will remain in hospital until stem cell collection.

## 2012-01-22 ENCOUNTER — Ambulatory Visit: Payer: BC Managed Care – PPO

## 2012-01-22 ENCOUNTER — Other Ambulatory Visit: Payer: BC Managed Care – PPO

## 2012-01-31 ENCOUNTER — Telehealth: Payer: Self-pay | Admitting: *Deleted

## 2012-01-31 ENCOUNTER — Ambulatory Visit (HOSPITAL_BASED_OUTPATIENT_CLINIC_OR_DEPARTMENT_OTHER): Payer: BC Managed Care – PPO

## 2012-01-31 DIAGNOSIS — C8589 Other specified types of non-Hodgkin lymphoma, extranodal and solid organ sites: Secondary | ICD-10-CM

## 2012-01-31 DIAGNOSIS — Z452 Encounter for adjustment and management of vascular access device: Secondary | ICD-10-CM

## 2012-01-31 NOTE — Progress Notes (Signed)
01/31/12  Hickman right chest flushed and dressing changed.  Insertion site slightly red with very small amount of exudate present. Patient brought the Heparin flushes with her and instructions from [facility]  that saline flush not necessary so no charges filed for flush saline or heparin.

## 2012-01-31 NOTE — Telephone Encounter (Signed)
Received call from Gardiner Coins at [facility],  Asks if pt can get her Hickman Cath dressing change and cap change/ flush done here today as pt is trying to leave town this afternoon.  Spoke w/ Flush nurse,  Sheralyn Boatman,  Who has time at 11:30 am.  Called pt's husband and instructed for pt to come at 11:30 for dressing change and flush.

## 2012-01-31 NOTE — Patient Instructions (Signed)
Call MD for problems 

## 2012-02-10 ENCOUNTER — Other Ambulatory Visit: Payer: BC Managed Care – PPO

## 2012-02-10 ENCOUNTER — Ambulatory Visit: Payer: BC Managed Care – PPO | Admitting: Oncology

## 2012-02-26 ENCOUNTER — Telehealth: Payer: Self-pay | Admitting: *Deleted

## 2012-02-26 ENCOUNTER — Encounter: Payer: Self-pay | Admitting: *Deleted

## 2012-02-26 ENCOUNTER — Other Ambulatory Visit: Payer: Self-pay | Admitting: Oncology

## 2012-02-26 DIAGNOSIS — C8590 Non-Hodgkin lymphoma, unspecified, unspecified site: Secondary | ICD-10-CM

## 2012-02-26 NOTE — Telephone Encounter (Signed)
VM from RN w/ Dr. Greggory Stallion informing pt being d/c'd home today s/p stem cell transplant.  Orders received for labs, PET scan and f/u w/ Dr. Gaylyn Rong.   Reviewed and done by Clenton Pare, NP.

## 2012-02-27 ENCOUNTER — Telehealth: Payer: Self-pay | Admitting: Oncology

## 2012-02-27 NOTE — Telephone Encounter (Signed)
pt rtn call and confirmed appts for [phone]

## 2012-02-27 NOTE — Telephone Encounter (Signed)
Called pts home lmovm for appts on 06/21-07/03 and pet scan on 07/24 @ [facility]. asked pt to rtn call to confirm appts

## 2012-02-28 ENCOUNTER — Other Ambulatory Visit (HOSPITAL_BASED_OUTPATIENT_CLINIC_OR_DEPARTMENT_OTHER): Payer: BC Managed Care – PPO | Admitting: Lab

## 2012-02-28 ENCOUNTER — Encounter: Payer: Self-pay | Admitting: *Deleted

## 2012-02-28 DIAGNOSIS — C8590 Non-Hodgkin lymphoma, unspecified, unspecified site: Secondary | ICD-10-CM

## 2012-02-28 DIAGNOSIS — C8589 Other specified types of non-Hodgkin lymphoma, extranodal and solid organ sites: Secondary | ICD-10-CM

## 2012-02-28 LAB — BASIC METABOLIC PANEL
BUN: 15 mg/dL (ref 6–23)
CO2: 28 mEq/L (ref 19–32)
Calcium: 10.1 mg/dL (ref 8.4–10.5)
Chloride: 102 mEq/L (ref 96–112)
Creatinine, Ser: 0.71 mg/dL (ref 0.50–1.10)
Glucose, Bld: 146 mg/dL — ABNORMAL HIGH (ref 70–99)

## 2012-02-28 LAB — CBC WITH DIFFERENTIAL/PLATELET
Basophils Absolute: 0 10*3/uL (ref 0.0–0.1)
Eosinophils Absolute: 0 10*3/uL (ref 0.0–0.5)
HCT: 30.2 % — ABNORMAL LOW (ref 34.8–46.6)
HGB: 10.4 g/dL — ABNORMAL LOW (ref 11.6–15.9)
LYMPH%: 21.1 % (ref 14.0–49.7)
MCV: 87.1 fL (ref 79.5–101.0)
MONO%: 11.4 % (ref 0.0–14.0)
NEUT#: 1.1 10*3/uL — ABNORMAL LOW (ref 1.5–6.5)
NEUT%: 66.8 % (ref 38.4–76.8)
Platelets: 39 10*3/uL — ABNORMAL LOW (ref 145–400)
RBC: 3.47 10*6/uL — ABNORMAL LOW (ref 3.70–5.45)

## 2012-02-28 NOTE — Progress Notes (Signed)
Faxed CBC report to [facility] at fax [phone].

## 2012-03-02 ENCOUNTER — Other Ambulatory Visit (HOSPITAL_BASED_OUTPATIENT_CLINIC_OR_DEPARTMENT_OTHER): Payer: BC Managed Care – PPO | Admitting: Lab

## 2012-03-02 ENCOUNTER — Telehealth: Payer: Self-pay

## 2012-03-02 DIAGNOSIS — C8590 Non-Hodgkin lymphoma, unspecified, unspecified site: Secondary | ICD-10-CM

## 2012-03-02 DIAGNOSIS — C8589 Other specified types of non-Hodgkin lymphoma, extranodal and solid organ sites: Secondary | ICD-10-CM

## 2012-03-02 LAB — CBC WITH DIFFERENTIAL/PLATELET
Basophils Absolute: 0 10*3/uL (ref 0.0–0.1)
EOS%: 0.3 % (ref 0.0–7.0)
Eosinophils Absolute: 0 10*3/uL (ref 0.0–0.5)
HGB: 10.5 g/dL — ABNORMAL LOW (ref 11.6–15.9)
MONO%: 12.5 % (ref 0.0–14.0)
NEUT#: 1.6 10*3/uL (ref 1.5–6.5)
RBC: 3.47 10*6/uL — ABNORMAL LOW (ref 3.70–5.45)
RDW: 14.2 % (ref 11.2–14.5)
lymph#: 0.4 10*3/uL — ABNORMAL LOW (ref 0.9–3.3)

## 2012-03-02 LAB — BASIC METABOLIC PANEL
BUN: 14 mg/dL (ref 6–23)
Calcium: 9.7 mg/dL (ref 8.4–10.5)
Creatinine, Ser: 0.78 mg/dL (ref 0.50–1.10)

## 2012-03-02 LAB — MAGNESIUM: Magnesium: 1.8 mg/dL (ref 1.5–2.5)

## 2012-03-02 NOTE — Telephone Encounter (Signed)
Message copied by Kallie Locks on Mon Mar 02, 2012  1:51 PM ------      Message from: Clenton Pare R      Created: Mon Mar 02, 2012 11:54 AM       Dorene Grebe            Please call patient and let her know that ANC is up to 1.6, hemoglobin is 10.5 (stable), and platelet count is 29,000. Review bleeding precautions. No platelet transfusion is needed unless platelet counts is <20,000 per [facility]. Keep lab appt on 6/27 as scheduled.            Thanks

## 2012-03-05 ENCOUNTER — Telehealth: Payer: Self-pay | Admitting: Oncology

## 2012-03-05 ENCOUNTER — Other Ambulatory Visit: Payer: BC Managed Care – PPO | Admitting: Lab

## 2012-03-05 DIAGNOSIS — C8590 Non-Hodgkin lymphoma, unspecified, unspecified site: Secondary | ICD-10-CM

## 2012-03-05 LAB — BASIC METABOLIC PANEL
CO2: 29 mEq/L (ref 19–32)
Chloride: 100 mEq/L (ref 96–112)
Glucose, Bld: 146 mg/dL — ABNORMAL HIGH (ref 70–99)
Sodium: 137 mEq/L (ref 135–145)

## 2012-03-05 LAB — CBC WITH DIFFERENTIAL/PLATELET
BASO%: 0.6 % (ref 0.0–2.0)
Eosinophils Absolute: 0 10*3/uL (ref 0.0–0.5)
HCT: 29.9 % — ABNORMAL LOW (ref 34.8–46.6)
LYMPH%: 19.9 % (ref 14.0–49.7)
MCHC: 34.9 g/dL (ref 31.5–36.0)
MONO#: 0.4 10*3/uL (ref 0.1–0.9)
NEUT#: 1.9 10*3/uL (ref 1.5–6.5)
Platelets: 33 10*3/uL — ABNORMAL LOW (ref 145–400)
RBC: 3.42 10*6/uL — ABNORMAL LOW (ref 3.70–5.45)
WBC: 3 10*3/uL — ABNORMAL LOW (ref 3.9–10.3)
lymph#: 0.6 10*3/uL — ABNORMAL LOW (ref 0.9–3.3)

## 2012-03-05 NOTE — Telephone Encounter (Signed)
LVM for patient to please call me to go over lab results.

## 2012-03-06 NOTE — Progress Notes (Signed)
Received call from patient wanting to know lab results; patient experiencing min weakness; advised patient to call if she has any problems; verbalized understanding.

## 2012-03-11 ENCOUNTER — Other Ambulatory Visit (HOSPITAL_BASED_OUTPATIENT_CLINIC_OR_DEPARTMENT_OTHER): Payer: BC Managed Care – PPO | Admitting: Lab

## 2012-03-11 ENCOUNTER — Ambulatory Visit (HOSPITAL_BASED_OUTPATIENT_CLINIC_OR_DEPARTMENT_OTHER): Payer: BC Managed Care – PPO | Admitting: Oncology

## 2012-03-11 VITALS — BP 111/72 | HR 94 | Temp 97.6°F | Ht 66.0 in | Wt 127.8 lb

## 2012-03-11 DIAGNOSIS — D61818 Other pancytopenia: Secondary | ICD-10-CM

## 2012-03-11 DIAGNOSIS — C8590 Non-Hodgkin lymphoma, unspecified, unspecified site: Secondary | ICD-10-CM

## 2012-03-11 DIAGNOSIS — C8589 Other specified types of non-Hodgkin lymphoma, extranodal and solid organ sites: Secondary | ICD-10-CM

## 2012-03-11 LAB — COMPREHENSIVE METABOLIC PANEL
AST: 29 U/L (ref 0–37)
Albumin: 4.1 g/dL (ref 3.5–5.2)
Alkaline Phosphatase: 94 U/L (ref 39–117)
BUN: 14 mg/dL (ref 6–23)
Creatinine, Ser: 0.89 mg/dL (ref 0.50–1.10)
Glucose, Bld: 207 mg/dL — ABNORMAL HIGH (ref 70–99)
Potassium: 4.4 mEq/L (ref 3.5–5.3)

## 2012-03-11 LAB — CBC WITH DIFFERENTIAL/PLATELET
Basophils Absolute: 0 10*3/uL (ref 0.0–0.1)
EOS%: 1.2 % (ref 0.0–7.0)
Eosinophils Absolute: 0 10*3/uL (ref 0.0–0.5)
HCT: 31.1 % — ABNORMAL LOW (ref 34.8–46.6)
HGB: 10.7 g/dL — ABNORMAL LOW (ref 11.6–15.9)
LYMPH%: 26.5 % (ref 14.0–49.7)
MCH: 31.2 pg (ref 25.1–34.0)
MCV: 90.3 fL (ref 79.5–101.0)
MONO%: 20.6 % — ABNORMAL HIGH (ref 0.0–14.0)
NEUT#: 1.4 10*3/uL — ABNORMAL LOW (ref 1.5–6.5)
NEUT%: 50.7 % (ref 38.4–76.8)
Platelets: 46 10*3/uL — ABNORMAL LOW (ref 145–400)
RDW: 15.1 % — ABNORMAL HIGH (ref 11.2–14.5)

## 2012-03-11 NOTE — Progress Notes (Signed)
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   PAST THERAPY:  She was diagnosed with non-Hodgkin lymphoma (Follicular Stage IV) when she was 61 year old. She was successfully treated with chemotherapy and remained disease free for 29 years. She does not remember exact chemoregimen since that oncology practice is no longer in service. In 2008, she was found to have recurrent stage IV follicular disease which she underwent chemotherapy (FND-Fludarabine/Mitoxantrone/Dex?) and was in remission. Since then, Dr. Parks Ranger [phone]) has been following her with biannually PET scan. In June 2012, she had small shotty adenopathy in the neck, chest, and abdomen. Decision was for watchful observation. In October 2012, her B symptoms came back and after evaluated and confirmed by Dr. Parks Ranger with progressive disease, she received single agent weekly Rituximab x 5 doses with last dose on11/21/2012. She has since transferred her care to [place], [place].  She started salvage Bendamustine/Rituxan on 10/09/2011 and achieved complete radiographic response on follow up PET scan.  She was mobilized with Rituxan/Cytoxan and underwent autologous BMT in May 2013.   CURRENT THERAPY:  Watchful observation.   INTERVAL HISTORY: Victoria Wood 61 y.o. female returns for regular follow up with her daughter.  She is still recovering from the recent BMT.  She has been having fatigue, anorexia, weight loss.  She spends most of her time at home due to concern for infection.  For the past 3 days, these symptoms have been slowing improving.  She had skin rash on the bilateral arms and legs from the BMT which is pruritic; however, they are improving as well.  She still has nasal draining and  congestion from allergy without fever or productive cough.   Patient denies fever, headache, visual changes, confusion, drenching night sweats, palpable lymph node swelling, mucositis, odynophagia, dysphagia, nausea vomiting, jaundice, chest pain, palpitation, gum bleeding, epistaxis, hematemesis, hemoptysis, abdominal pain, abdominal swelling, early satiety, melena, hematochezia, hematuria, skin rash, spontaneous bleeding, joint swelling, joint pain, heat or cold intolerance, bowel bladder incontinence, back pain, focal motor weakness, paresthesia, depression, suicidal or homocidal ideation, feeling hopelessness.   Past Medical History  Diagnosis Date  . Gastric polyps     History of  . GERD (gastroesophageal reflux disease)   . HTN (hypertension)   . Asthma   . Non Hodgkin's lymphoma   . Anemia   . Hyperlipidemia   . Complication of anesthesia     has woken up during surgery   . Pneumonia     hx of pneumonia   . Blood transfusion   . Cancer     hx of Rituxan in 07/2011  . Non-Hodgkin's lymphoma   . DM type 2 (diabetes mellitus, type 2)   . 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 Hypomagnesemia 01/13/2012    Past Surgical History  Procedure Date  . Portacath placement   . Upper gastrointestinal endoscopy 02/2011    gastric polyps x 2  . Neck and thigh surgery     lymph nodes removed  . Esophagogastroduodenoscopy 08/22/2011    Procedure: ESOPHAGOGASTRODUODENOSCOPY (EGD);  Surgeon: Louis Meckel, MD;  Location: [facility];  Service: Endoscopy;  Laterality: N/A;  . Lymph node biopsy 10/02/11    left axillary    Current Outpatient Prescriptions  Medication Sig Dispense Refill  . acyclovir (ZOVIRAX)  800 MG tablet Take 800 mg by mouth Twice daily.      Marland Kitchen albuterol (PROVENTIL HFA;VENTOLIN HFA) 108 (90 BASE) MCG/ACT inhaler Inhale 2 puffs into the lungs every 6 (six) hours as needed. Wheezing       . ALPRAZolam (XANAX) 0.25 MG tablet Take 0.25 mg by mouth 2  (two) times daily as needed. anxiety      . Antipyrine-Benzocaine (AURALGAN) 54-14 MG/ML SOLN Place 3 drops into both ears once a week.       . fluticasone (FLONASE) 50 MCG/ACT nasal spray Place 2 sprays into the nose daily.  16 g  0  . folic acid (FOLVITE) 1 MG tablet Take 1 mg by mouth Daily.      Marland Kitchen lidocaine-prilocaine (EMLA) cream Apply topically as needed.  30 g  2  . loratadine (CLARITIN) 10 MG tablet Take 10 mg by mouth daily.      . methylcellulose (ARTIFICIAL TEARS) 1 % ophthalmic solution Place 1 drop into both eyes as needed.      . ondansetron (ZOFRAN) 8 MG tablet Take 8 mg by mouth as needed.      . senna (SENOKOT) 8.6 MG TABS Take 2 tablets by mouth daily as needed. Constipation       . venlafaxine (EFFEXOR-XR) 75 MG 24 hr capsule Take 75 mg by mouth daily after breakfast.         ALLERGIES:  is allergic to penicillins and shellfish allergy.  REVIEW OF SYSTEMS:  The rest of the 14-point review of system was negative.   Filed Vitals:   03/11/12 0945  BP: 111/72  Pulse: 94  Temp: 97.6 F (36.4 C)   Wt Readings from Last 3 Encounters:  03/11/12 127 lb 12.8 oz (57.97 kg)  12/31/11 137 lb 6.4 oz (62.324 kg)  12/03/11 142 lb 4.8 oz (64.547 kg)   ECOG Performance status: 1-2  PHYSICAL EXAMINATION:   General:  Thin-appearing woman, in no acute distress.  Eyes:  no scleral icterus.  ENT:  There were no oropharyngeal lesions.  Neck was without thyromegaly.  Lymphatics:  Negative cervical, supraclavicular or axillary adenopathy.  Respiratory: lungs were clear bilaterally without wheezing or crackles.  Cardiovascular:  Regular rate and rhythm, S1/S2, without murmur, rub or gallop.  There was no pedal edema.  GI:  abdomen was soft, flat, nontender, nondistended, without organomegaly.  Muscoloskeletal:  no spinal tenderness of palpation of vertebral spine.  Skin exam was without echymosis, petichae.  Neuro exam was nonfocal.  Patient was able to get on and off exam table without  assistance.  Gait was normal.  Patient was alerted and oriented.  Attention was good.   Language was appropriate.  Mood was normal without depression.  Speech was not pressured.  Thought content was not tangential.    LABORATORY/RADIOLOGY DATA:  Lab Results  Component Value Date   WBC 2.8* 03/11/2012   HGB 10.7* 03/11/2012   HCT 31.1* 03/11/2012   PLT 46* 03/11/2012   GLUCOSE 146* 03/05/2012   ALKPHOS 99 12/31/2011   ALT 10 12/31/2011   AST 17 12/31/2011   NA 137 03/05/2012   K 4.1 03/05/2012   CL 100 03/05/2012   CREATININE 1.06 03/05/2012   BUN 17 03/05/2012   CO2 29 03/05/2012   HGBA1C 5.8* 09/10/2011     ASSESSMENT AND PLAN:   1. HTN: She has lost weight; and thus has had normal BP being off of ramipril.  2. DM, type II: She was on Metformin but  is now on diet control due to weight loss.  3. Depression, anxiety: She is on Effexor XR.  4. Seasonal allergy: she is on flonase and claritine per PCP.  6. Intermittent cholestatis: From lymphoma. Recent work up did not show evidence of biliary disease.  This is resolved now.  7. 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  - s/p salvage chemo and auto BMT. - She is in remission as of pre BMT PET scan. - We've scheduled for her to obtain post BMT PET scan for 04/01/12 per Dr. Hezzie Bump request.  8. Pancytopenia:  From recent chemo, and BMT.  These are improving.  There is no active bleeding.  There is no indication for transfusion.  9.  Fatigue; anorexia, weight loss: from recent chemo and BMT.  These are improving as well.      The length of time of the face-to-face encounter was 15  minutes. More than 50% of time was spent counseling and coordination of care.

## 2012-03-11 NOTE — Patient Instructions (Signed)
1.  History of NonHodgkin's B cell lymphoma: status post salvage chemo and bone marrow transplant.  In remission at this time.  Next CT scan will be in about 3 months.  2.  Low blood count:  Slowly recovering.  This slow process is expected as you've had lots of chemotherapies in the past.  3.  Follow up:  The day after CT scan in October 2013.

## 2012-03-13 ENCOUNTER — Telehealth: Payer: Self-pay | Admitting: Oncology

## 2012-03-13 NOTE — Telephone Encounter (Signed)
lmonvm for pt re appt for 10/3. Per 7/3 pof have pt keep pet for 7/24 and see [facility] in October.

## 2012-03-13 NOTE — Telephone Encounter (Signed)
Add to previous note schedule for 7/24 July pet and 10/3 lb/fu mailed.

## 2012-03-16 ENCOUNTER — Telehealth: Payer: Self-pay | Admitting: *Deleted

## 2012-03-16 NOTE — Telephone Encounter (Signed)
Call from pt's husband states pt needs refill on Xanax she takes BID and was last rx'd by Dr. Greggory Stallion.  Pt does have appt to see Dr. Greggory Stallion at [facility] again at end of this month on 04/08/12.   He also asks if pt supposed to have any more labs drawn soon?   Instructed to call Dr. Hezzie Bump office for refill on xanax or call pt's PCP.  Informed no labs ordered by Dr. Gaylyn Rong until next visit in October.  Instructed husband to ask Dr. Greggory Stallion if he wanted labs any sooner.   Husband verbalized understanding and will call Dr. Hezzie Bump office.

## 2012-03-17 ENCOUNTER — Telehealth: Payer: Self-pay | Admitting: Oncology

## 2012-03-17 ENCOUNTER — Other Ambulatory Visit: Payer: Self-pay | Admitting: *Deleted

## 2012-03-17 DIAGNOSIS — C8590 Non-Hodgkin lymphoma, unspecified, unspecified site: Secondary | ICD-10-CM

## 2012-03-17 NOTE — Progress Notes (Signed)
Orders received from [facility] for weekly CBC w/ Diff,  Platelets and Magnesium starting Thursday 03/19/12.  Her next appt at [facility] is on 04/08/12.   Blood Transfusion Guidelines; All Blood products must be filtered and irradiated. Pheresed Platelets for Platelet count < 20,000 and PRBCs for Hgb < 8.5 gm.   Orders were signed by Fransisca Kaufmann, RN Ph# [phone] and Fax# [phone].

## 2012-03-17 NOTE — Telephone Encounter (Signed)
l/m with appt info for labs

## 2012-03-18 ENCOUNTER — Other Ambulatory Visit: Payer: Self-pay | Admitting: Oncology

## 2012-03-19 ENCOUNTER — Other Ambulatory Visit (HOSPITAL_BASED_OUTPATIENT_CLINIC_OR_DEPARTMENT_OTHER): Payer: BC Managed Care – PPO | Admitting: Lab

## 2012-03-19 ENCOUNTER — Encounter: Payer: Self-pay | Admitting: *Deleted

## 2012-03-19 DIAGNOSIS — C8589 Other specified types of non-Hodgkin lymphoma, extranodal and solid organ sites: Secondary | ICD-10-CM

## 2012-03-19 DIAGNOSIS — C8590 Non-Hodgkin lymphoma, unspecified, unspecified site: Secondary | ICD-10-CM

## 2012-03-19 LAB — CBC WITH DIFFERENTIAL/PLATELET
BASO%: 0.9 % (ref 0.0–2.0)
LYMPH%: 22 % (ref 14.0–49.7)
MCHC: 34.6 g/dL (ref 31.5–36.0)
MONO#: 0.5 10*3/uL (ref 0.1–0.9)
Platelets: 55 10*3/uL — ABNORMAL LOW (ref 145–400)
RBC: 2.86 10*6/uL — ABNORMAL LOW (ref 3.70–5.45)
WBC: 2.1 10*3/uL — ABNORMAL LOW (ref 3.9–10.3)

## 2012-03-19 LAB — BASIC METABOLIC PANEL
CO2: 24 mEq/L (ref 19–32)
Calcium: 9 mg/dL (ref 8.4–10.5)
Sodium: 142 mEq/L (ref 135–145)

## 2012-03-19 LAB — MAGNESIUM: Magnesium: 1.9 mg/dL (ref 1.5–2.5)

## 2012-03-19 NOTE — Progress Notes (Signed)
Faxed CBC to [facility] att; Fransisca Kaufmann at fax [phone].

## 2012-03-26 ENCOUNTER — Encounter: Payer: Self-pay | Admitting: *Deleted

## 2012-03-26 ENCOUNTER — Other Ambulatory Visit: Payer: Self-pay | Admitting: *Deleted

## 2012-03-26 ENCOUNTER — Other Ambulatory Visit (HOSPITAL_BASED_OUTPATIENT_CLINIC_OR_DEPARTMENT_OTHER): Payer: BC Managed Care – PPO | Admitting: Lab

## 2012-03-26 DIAGNOSIS — C8589 Other specified types of non-Hodgkin lymphoma, extranodal and solid organ sites: Secondary | ICD-10-CM

## 2012-03-26 DIAGNOSIS — C8590 Non-Hodgkin lymphoma, unspecified, unspecified site: Secondary | ICD-10-CM

## 2012-03-26 LAB — BASIC METABOLIC PANEL
BUN: 9 mg/dL (ref 6–23)
CO2: 26 mEq/L (ref 19–32)
Chloride: 105 mEq/L (ref 96–112)
Glucose, Bld: 121 mg/dL — ABNORMAL HIGH (ref 70–99)
Potassium: 3.7 mEq/L (ref 3.5–5.3)

## 2012-03-26 LAB — CBC WITH DIFFERENTIAL/PLATELET
Basophils Absolute: 0 10*3/uL (ref 0.0–0.1)
Eosinophils Absolute: 0 10*3/uL (ref 0.0–0.5)
HGB: 9.7 g/dL — ABNORMAL LOW (ref 11.6–15.9)
MCV: 96.2 fL (ref 79.5–101.0)
MONO#: 0.4 10*3/uL (ref 0.1–0.9)
NEUT#: 1.5 10*3/uL (ref 1.5–6.5)
RDW: 23.7 % — ABNORMAL HIGH (ref 11.2–14.5)
WBC: 2.7 10*3/uL — ABNORMAL LOW (ref 3.9–10.3)
lymph#: 0.7 10*3/uL — ABNORMAL LOW (ref 0.9–3.3)

## 2012-03-26 NOTE — Progress Notes (Signed)
Faxed today's CBC results and CMET/ Mag from 7/11 to [facility] att; Darci Needle at [phone].

## 2012-03-26 NOTE — Progress Notes (Signed)
Faxed CMET and Mag from today to Darci Needle at [facility] at fax [phone]

## 2012-03-31 ENCOUNTER — Telehealth: Payer: Self-pay | Admitting: *Deleted

## 2012-03-31 NOTE — Telephone Encounter (Signed)
PT.'S HUSBAND WILL CHECK WITH [facility] TO SEE IF A FLUSH WILL BE DONE AT END OF July APPOINTMENT. SHE MAY NEED A FLUSH APPOINTMENT AT [facility] BEFORE SHE SEES DR.HA ON 06/11/12. THIS NOTE TO DR.HA'S NURSE'S DESK.

## 2012-04-01 ENCOUNTER — Encounter (HOSPITAL_COMMUNITY)
Admission: RE | Admit: 2012-04-01 | Discharge: 2012-04-01 | Disposition: A | Discharge status: Home / Self Care | Payer: BC Managed Care – PPO | Source: Ambulatory Visit | Attending: Oncology | Admitting: Oncology

## 2012-04-01 ENCOUNTER — Telehealth: Payer: Self-pay | Admitting: Oncology

## 2012-04-01 ENCOUNTER — Encounter (HOSPITAL_COMMUNITY): Payer: Self-pay

## 2012-04-01 DIAGNOSIS — R911 Solitary pulmonary nodule: Secondary | ICD-10-CM | POA: Insufficient documentation

## 2012-04-01 DIAGNOSIS — C8590 Non-Hodgkin lymphoma, unspecified, unspecified site: Secondary | ICD-10-CM

## 2012-04-01 DIAGNOSIS — C8589 Other specified types of non-Hodgkin lymphoma, extranodal and solid organ sites: Secondary | ICD-10-CM | POA: Insufficient documentation

## 2012-04-01 LAB — GLUCOSE, CAPILLARY: Glucose-Capillary: 113 mg/dL — ABNORMAL HIGH (ref 70–99)

## 2012-04-01 MED ORDER — FLUDEOXYGLUCOSE F - 18 (FDG) INJECTION
15.7000 | Freq: Once | INTRAVENOUS | Status: AC | PRN
  Administered 2012-04-01: 15.7 via INTRAVENOUS

## 2012-04-01 NOTE — Telephone Encounter (Signed)
Faxed PET scan results to Dr Greggory Stallion [phone].

## 2012-04-02 ENCOUNTER — Encounter: Payer: Self-pay | Admitting: *Deleted

## 2012-04-02 ENCOUNTER — Ambulatory Visit (HOSPITAL_BASED_OUTPATIENT_CLINIC_OR_DEPARTMENT_OTHER): Payer: BC Managed Care – PPO | Admitting: Lab

## 2012-04-02 DIAGNOSIS — C8589 Other specified types of non-Hodgkin lymphoma, extranodal and solid organ sites: Secondary | ICD-10-CM

## 2012-04-02 DIAGNOSIS — C8590 Non-Hodgkin lymphoma, unspecified, unspecified site: Secondary | ICD-10-CM

## 2012-04-02 LAB — CBC WITH DIFFERENTIAL/PLATELET
BASO%: 0.8 % (ref 0.0–2.0)
Basophils Absolute: 0 10*3/uL (ref 0.0–0.1)
Eosinophils Absolute: 0 10*3/uL (ref 0.0–0.5)
HCT: 28 % — ABNORMAL LOW (ref 34.8–46.6)
HGB: 9.7 g/dL — ABNORMAL LOW (ref 11.6–15.9)
MONO#: 0.4 10*3/uL (ref 0.1–0.9)
NEUT%: 60.8 % (ref 38.4–76.8)
WBC: 2.7 10*3/uL — ABNORMAL LOW (ref 3.9–10.3)
lymph#: 0.6 10*3/uL — ABNORMAL LOW (ref 0.9–3.3)

## 2012-04-02 LAB — BASIC METABOLIC PANEL
BUN: 10 mg/dL (ref 6–23)
CO2: 28 mEq/L (ref 19–32)
Chloride: 106 mEq/L (ref 96–112)
Creatinine, Ser: 0.93 mg/dL (ref 0.50–1.10)
Glucose, Bld: 123 mg/dL — ABNORMAL HIGH (ref 70–99)

## 2012-04-02 NOTE — Progress Notes (Signed)
Faxed BMET and Mag to [facility] att; Darci Needle at fax [phone]

## 2012-04-02 NOTE — Progress Notes (Signed)
Faxed CBC results to [facility] att; Darci Needle at fax# [phone].

## 2012-05-17 NOTE — Progress Notes (Signed)
Rescheduled

## 2012-05-19 ENCOUNTER — Emergency Department (HOSPITAL_COMMUNITY)
Admission: EM | Admit: 2012-05-19 | Discharge: 2012-05-19 | Disposition: A | Discharge status: Home / Self Care | Payer: BC Managed Care – PPO | Attending: Emergency Medicine | Admitting: Emergency Medicine

## 2012-05-19 ENCOUNTER — Emergency Department (HOSPITAL_COMMUNITY): Payer: BC Managed Care – PPO

## 2012-05-19 ENCOUNTER — Encounter (HOSPITAL_COMMUNITY): Payer: Self-pay | Admitting: Emergency Medicine

## 2012-05-19 ENCOUNTER — Other Ambulatory Visit: Payer: Self-pay | Admitting: Oncology

## 2012-05-19 DIAGNOSIS — D649 Anemia, unspecified: Secondary | ICD-10-CM

## 2012-05-19 DIAGNOSIS — R079 Chest pain, unspecified: Secondary | ICD-10-CM

## 2012-05-19 DIAGNOSIS — R9431 Abnormal electrocardiogram [ECG] [EKG]: Secondary | ICD-10-CM | POA: Insufficient documentation

## 2012-05-19 DIAGNOSIS — J45909 Unspecified asthma, uncomplicated: Secondary | ICD-10-CM | POA: Insufficient documentation

## 2012-05-19 DIAGNOSIS — Z9481 Bone marrow transplant status: Secondary | ICD-10-CM | POA: Insufficient documentation

## 2012-05-19 DIAGNOSIS — E119 Type 2 diabetes mellitus without complications: Secondary | ICD-10-CM | POA: Insufficient documentation

## 2012-05-19 DIAGNOSIS — R0989 Other specified symptoms and signs involving the circulatory and respiratory systems: Secondary | ICD-10-CM | POA: Insufficient documentation

## 2012-05-19 DIAGNOSIS — R0602 Shortness of breath: Secondary | ICD-10-CM | POA: Insufficient documentation

## 2012-05-19 DIAGNOSIS — I4949 Other premature depolarization: Secondary | ICD-10-CM | POA: Insufficient documentation

## 2012-05-19 DIAGNOSIS — M79609 Pain in unspecified limb: Secondary | ICD-10-CM | POA: Insufficient documentation

## 2012-05-19 DIAGNOSIS — R0789 Other chest pain: Secondary | ICD-10-CM | POA: Insufficient documentation

## 2012-05-19 DIAGNOSIS — E785 Hyperlipidemia, unspecified: Secondary | ICD-10-CM | POA: Insufficient documentation

## 2012-05-19 DIAGNOSIS — I10 Essential (primary) hypertension: Secondary | ICD-10-CM | POA: Insufficient documentation

## 2012-05-19 DIAGNOSIS — C8589 Other specified types of non-Hodgkin lymphoma, extranodal and solid organ sites: Secondary | ICD-10-CM | POA: Insufficient documentation

## 2012-05-19 DIAGNOSIS — K219 Gastro-esophageal reflux disease without esophagitis: Secondary | ICD-10-CM | POA: Insufficient documentation

## 2012-05-19 LAB — CBC WITH DIFFERENTIAL/PLATELET
Band Neutrophils: 0 % (ref 0–10)
Blasts: 0 %
HCT: 22 % — ABNORMAL LOW (ref 36.0–46.0)
Lymphocytes Relative: 42 % (ref 12–46)
Lymphs Abs: 0.8 10*3/uL (ref 0.7–4.0)
Monocytes Absolute: 0.3 10*3/uL (ref 0.1–1.0)
Monocytes Relative: 14 % — ABNORMAL HIGH (ref 3–12)
Neutro Abs: 0.9 10*3/uL — ABNORMAL LOW (ref 1.7–7.7)
Platelets: 42 10*3/uL — ABNORMAL LOW (ref 150–400)
RDW: 14.8 % (ref 11.5–15.5)
WBC: 2 10*3/uL — ABNORMAL LOW (ref 4.0–10.5)
nRBC: 0 /100 WBC

## 2012-05-19 LAB — COMPREHENSIVE METABOLIC PANEL
Alkaline Phosphatase: 62 U/L (ref 39–117)
BUN: 12 mg/dL (ref 6–23)
GFR calc Af Amer: 73 mL/min — ABNORMAL LOW (ref >90)
Glucose, Bld: 97 mg/dL (ref 70–99)
Potassium: 4.1 mEq/L (ref 3.5–5.1)
Total Protein: 5.6 g/dL — ABNORMAL LOW (ref 6.0–8.3)

## 2012-05-19 MED ORDER — IOHEXOL 350 MG/ML SOLN
70.0000 mL | Freq: Once | INTRAVENOUS | Status: AC | PRN
  Administered 2012-05-19: 70 mL via INTRAVENOUS

## 2012-05-19 MED ORDER — ALPRAZOLAM 0.25 MG PO TABS: 0.5000 mg | ORAL_TABLET | Freq: Once | ORAL | Status: DC

## 2012-05-19 MED ORDER — ALPRAZOLAM 0.25 MG PO TABS
0.2500 mg | ORAL_TABLET | Freq: Once | ORAL | Status: AC
  Administered 2012-05-19: 0.25 mg via ORAL
  Filled 2012-05-19: qty 1

## 2012-05-19 NOTE — ED Notes (Signed)
Pt denies CP

## 2012-05-19 NOTE — ED Provider Notes (Signed)
History     CSN: [id number]  Arrival date & time 05/19/12  1337   First MD Initiated Contact with Patient 05/19/12 1503      Chief Complaint  Patient presents with  . Chest Pain    (Consider location/radiation/quality/duration/timing/severity/associated sxs/prior treatment) HPI Comments: Patient with history of non-Hodgkin's lymphoma, status post bone marrow transplant 2-3 months ago done at [facility] -- presents today with multiple complaints. Patient states that she awoke from sleep this morning with 'shallow breathing'/shortness of breath and muscle aches in her legs. Patient states that she just felt "weird". She went and saw her primary care physician who was concerned about pulmonary embolism and sent patient to the emergency department for further evaluation. Patient has had about 20 episodes of short-lived chest pressure. Each episode lasts several seconds before resolving. Patient does not have any risk factors for coronary artery disease and she thinks that she has had a coronary catheterization performed in the last several months that was negative. She states "I've always had strong heart". She denies history of high blood pressure, high cholesterol, smoking, or current diabetes. Patient denies leg swelling. She had a recent trip to [facility] and was in a car for 4 hours. Onset was acute. Course is constant. Nothing makes symptoms better or worse. Patient was given aspirin by EMS prior to arrival. Daughter who is with patient states that her memory has been slow since she has received chemotherapy.  The history is provided by the patient, a relative and medical records.    Past Medical History  Diagnosis Date  . Gastric polyps     History of  . GERD (gastroesophageal reflux disease)   . HTN (hypertension)   . Asthma   . Non Hodgkin's lymphoma   . Anemia   . Hyperlipidemia   . Complication of anesthesia     has woken up during surgery   . Pneumonia     hx of pneumonia     . Blood transfusion   . Cancer     hx of Rituxan in 07/2011  . Non-Hodgkin's lymphoma   . DM type 2 (diabetes mellitus, type 2)   . 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 Hypomagnesemia 01/13/2012    Past Surgical History  Procedure Date  . Portacath placement   . Upper gastrointestinal endoscopy 02/2011    gastric polyps x 2  . Neck and thigh surgery     lymph nodes removed  . Esophagogastroduodenoscopy 08/22/2011    Procedure: ESOPHAGOGASTRODUODENOSCOPY (EGD);  Surgeon: Louis Meckel, MD;  Location: [facility];  Service: Endoscopy;  Laterality: N/A;  . Lymph node biopsy 10/02/11    left axillary    Family History  Problem Relation Age of Onset  . Hypertension Mother   . Cancer Mother     breast  . Hypertension Father   . Kidney disease Father   . Cancer Father     throat  . Cancer Maternal Grandmother     breast    History  Substance Use Topics  . Smoking status: Never Smoker   . Smokeless tobacco: Never Used  . Alcohol Use: No     l    OB History    Grav Para Term Preterm Abortions TAB SAB Ect Mult Living                  Review of Systems  Constitutional: Negative for fever.  HENT: Negative for sore throat and rhinorrhea.   Eyes: Negative  for redness.  Respiratory: Positive for shortness of breath. Negative for cough.   Cardiovascular: Positive for chest pain.  Gastrointestinal: Negative for nausea, vomiting, abdominal pain and diarrhea.  Genitourinary: Negative for dysuria.  Musculoskeletal: Positive for myalgias.  Skin: Negative for rash.  Neurological: Negative for headaches.    Allergies  Penicillins and Shellfish allergy  Home Medications   Current Outpatient Rx  Name Route Sig Dispense Refill  . ACYCLOVIR 800 MG PO TABS Oral Take 800 mg by mouth Twice daily.    . ALBUTEROL SULFATE HFA 108 (90 BASE) MCG/ACT IN AERS Inhalation Inhale 2 puffs into the lungs every 6 (six) hours as needed. Wheezing     . ALPRAZOLAM 0.25  MG PO TABS Oral Take 0.25 mg by mouth 2 (two) times daily as needed. anxiety    . ANTIPYRINE-BENZOCAINE 54-14 MG/ML OT SOLN Both Ears Place 3 drops into both ears once a week.     Marland Kitchen FLUTICASONE PROPIONATE 50 MCG/ACT NA SUSP Nasal Place 2 sprays into the nose daily.    Marland Kitchen FOLIC ACID 1 MG PO TABS Oral Take 1 mg by mouth Daily.    Marland Kitchen LORATADINE 10 MG PO TABS Oral Take 10 mg by mouth daily.    . METHYLCELLULOSE 1 % OP SOLN Both Eyes Place 1 drop into both eyes daily as needed. dryness    . SENNA 8.6 MG PO TABS Oral Take 2 tablets by mouth at bedtime. Constipation     . VENLAFAXINE HCL ER 75 MG PO CP24 Oral Take 75 mg by mouth daily after breakfast.       BP 130/76  Temp 98.4 F (36.9 C) (Oral)  Resp 18  SpO2 100%  Physical Exam  Nursing note and vitals reviewed. Constitutional: She appears well-developed and well-nourished.  HENT:  Head: Normocephalic and atraumatic.  Mouth/Throat: Mucous membranes are normal. Mucous membranes are not dry.  Eyes: Conjunctivae are normal. Pupils are equal, round, and reactive to light. Right eye exhibits no discharge. Left eye exhibits no discharge.  Neck: Trachea normal and normal range of motion. Neck supple. Normal carotid pulses and no JVD present. No muscular tenderness present. Carotid bruit is not present. No tracheal deviation present.  Cardiovascular: Normal rate, regular rhythm, S1 normal, S2 normal, normal heart sounds and intact distal pulses.  Exam reveals no decreased pulses.   No murmur heard. Pulmonary/Chest: Effort normal and breath sounds normal. No respiratory distress. She has no wheezes. She exhibits no tenderness.  Abdominal: Soft. Normal aorta and bowel sounds are normal. There is no tenderness. There is no rebound and no guarding.  Musculoskeletal: Normal range of motion.  Neurological: She is alert. She has normal strength. No cranial nerve deficit or sensory deficit. She displays a negative Romberg sign. GCS eye subscore is 4. GCS  verbal subscore is 5. GCS motor subscore is 6.  Skin: Skin is warm and dry. She is not diaphoretic. No cyanosis. No pallor.  Psychiatric: She has a normal mood and affect.    ED Course  Procedures (including critical care time)  Labs Reviewed  COMPREHENSIVE METABOLIC PANEL - Abnormal; Notable for the following:    Total Protein 5.6 (*)     Albumin 3.4 (*)     GFR calc non Af Amer 63 (*)     GFR calc Af Amer 73 (*)     All other components within normal limits  CBC WITH DIFFERENTIAL - Abnormal; Notable for the following:    WBC 2.0 (*)  RBC 2.21 (*)     Hemoglobin 7.8 (*)     HCT 22.0 (*)     MCH 35.3 (*)     Platelets 42 (*)  PLATELET COUNT CONFIRMED BY SMEAR   Monocytes Relative 14 (*)     Neutro Abs 0.9 (*)     All other components within normal limits  POCT I-STAT TROPONIN I   Dg Chest 2 View  05/19/2012  *RADIOLOGY REPORT*  Clinical Data: Left-sided chest pain and some shortness of breath.  CHEST - 2 VIEW  Comparison: 10/10/2011  Findings: The cardiac silhouette is mildly enlarged.  No mediastinal or hilar masses or adenopathy.  Stable right anterior chest wall Port-A-Cath with the catheter tip in the lower superior vena cava.  The lungs are clear.  Status post left breast surgery with left axillary clips, stable.  The bony thorax is demineralized but intact.  IMPRESSION: No acute cardiopulmonary disease.  No change from the prior study.   Original Report Authenticated By: Domenic Moras, M.D.      1. Shortness of breath   2. Chest pain   3. Anemia     3:36 PM Patient seen and examined. Work-up initiated. ASA given prior.   Vital signs reviewed and are as follows: Filed Vitals:   05/19/12 1358  BP: 130/76  Temp: 98.4 F (36.9 C)  Resp: 18    Date: 05/19/2012  Rate: 100  Rhythm: sinus tachycardia  QRS Axis: normal  Intervals: QT prolonged  ST/T Wave abnormalities: normal  Conduction Disutrbances:none  Narrative Interpretation:   Old EKG Reviewed: changes  noted from 08/21/11 -- Qtc 478  5:40 PM Patient was anxious, stating she wanted to leave to go home to let her dog out. We talked about findings to this point and need to stay for further evaluation of symptoms, low hgb. She agrees. Dr. Preston Fleeting has seen. Pending CT.   8:36 PM CT does not show a blood clots. CT shows slight incremental increase in size of the paratracheal lymph node. Patient was informed of all results. Orthostatics checked and patient does not get lightheaded. She has not exhibits exertional dyspnea. Discussed results with Dr. Preston Fleeting who has seen patient. Agree that patient is stable for discharge home. She agrees to call her physician at [facility] tomorrow and inform them of her low blood counts. She is comfortable with this plan and wants to go home. Patient urged to return with worsening symptoms, worsening chest pain, shortness of breath, syncope, or any other concerns. Patient and daughter verbalized understanding and agreed plan.   MDM  Chest pain, shortness of breath -- resolved in emergency department. Do not suspect ACS. PE not demonstrated on CT pulmonary angiography.   Anemia, exacerbation of chronic anemia. Patient is asymptomatic at this time. There is no indication of a GI bleed per the patient history. Patient has followup at [facility] and agrees to call tomorrow and inform them of her low hemoglobin. Strict return instructions given.  Enlarged lymph node seen on CAT scan. Patient informed. She has followup at [facility] next month. Patient urged to obtain records and bring these to her appointment.        Renne Crigler, [place] 05/19/12 2040

## 2012-05-19 NOTE — ED Notes (Signed)
Pt not in room.

## 2012-05-19 NOTE — ED Provider Notes (Signed)
61 year old female status post bone marrow transplant for lymphoma had some intermittent dyspnea and vague chest pains today. Laboratory workup has shown worsening of her baseline anemia with hemoglobin down to 7.8. Because of relatively high risk of pulmonary embolism, screening with d-dimer is not appropriate and she will be sent for CT angiogram of her chest.   Date: 05/19/2012  Rate: 100  Rhythm: normal sinus rhythm and premature ventricular contractions (PVC)  QRS Axis: normal  Intervals: QT prolonged  ST/T Wave abnormalities: normal  Conduction Disutrbances:none  Narrative Interpretation: Slightly prolonged QT interval. When compared with ECG of 08/21/2011, no significant changes are seen.  Old EKG Reviewed: unchanged    [name], MD 05/19/12 1732

## 2012-05-19 NOTE — ED Notes (Signed)
Per EMS, Pt was at MD office and had slow onset of left sided chest pain that comes and goes.  Pt described as pressure in small area of left chest.   Pt is 100 days out of having bone marrow transplant for nonhoskins lymph.  No other history.  Pr denies any SOB, N/V. Pt alert oriented X4.  20g L AC, NS, 2 nitro enroute and 4 baby aspirin.  Pt on 2l [place] 100%.  Pt denies O2 use at home.

## 2012-05-19 NOTE — ED Notes (Signed)
Pt taken off 2L/min oxygen.

## 2012-05-19 NOTE — ED Notes (Signed)
EMS ekg unremarkable, ST 110 with occasional PVC

## 2012-05-20 ENCOUNTER — Telehealth: Payer: Self-pay | Admitting: Oncology

## 2012-05-20 ENCOUNTER — Telehealth: Payer: Self-pay | Admitting: *Deleted

## 2012-05-20 ENCOUNTER — Other Ambulatory Visit: Payer: Self-pay | Admitting: Oncology

## 2012-05-20 DIAGNOSIS — D539 Nutritional anemia, unspecified: Secondary | ICD-10-CM

## 2012-05-20 DIAGNOSIS — C8590 Non-Hodgkin lymphoma, unspecified, unspecified site: Secondary | ICD-10-CM

## 2012-05-20 NOTE — Telephone Encounter (Signed)
S/w pt re appts for 9/12 and 9/13.

## 2012-05-20 NOTE — Telephone Encounter (Signed)
Left VM for PA with PCP office. Notified her that Dr Gaylyn Rong is aware of pt's visit to ER and labs. The patient has already been scheduled to see [place] on 8/12 and her family was notified of date and time. Left number for her to call for questions or concerns.

## 2012-05-20 NOTE — Telephone Encounter (Signed)
Victoria Gobble, PA with [facility] called about this mutual patient with Dr. Gaylyn Rong.  On yesterday patient was sent to ER to f/o PE.  Patient is s/p bone marrow transplant.  She was found to have hgb of 7.8 and was released.  Call is to make sure patient is seen by a provider today due to being symptomatic anemia with s.o.b and chest pain.  Will notify providers for PA to obtain feedback.  Victoria Wood is off today but can be reached via cell number [phone].  Her nurse Victoria Wood has left message earlier.

## 2012-05-20 NOTE — Telephone Encounter (Signed)
Spoke with husband jim, informed him that dr ha has sent a pof to schedulers for appt for Treana in the next few days with kristin and to transfuse if necessary. They should expect a call from schedulers. Rosanne Ashing verbalized understanding.

## 2012-05-20 NOTE — ED Provider Notes (Signed)
Medical screening examination/treatment/procedure(s) were conducted as a shared visit with non-physician practitioner(s) and myself.  I personally evaluated the patient during the encounter   [name], MD 05/20/12 0222

## 2012-05-21 ENCOUNTER — Other Ambulatory Visit (HOSPITAL_BASED_OUTPATIENT_CLINIC_OR_DEPARTMENT_OTHER): Payer: BC Managed Care – PPO | Admitting: Lab

## 2012-05-21 ENCOUNTER — Ambulatory Visit (HOSPITAL_BASED_OUTPATIENT_CLINIC_OR_DEPARTMENT_OTHER): Payer: BC Managed Care – PPO | Admitting: Oncology

## 2012-05-21 ENCOUNTER — Encounter (HOSPITAL_COMMUNITY)
Admission: RE | Admit: 2012-05-21 | Discharge: 2012-05-21 | Disposition: A | Discharge status: Home / Self Care | Payer: BC Managed Care – PPO | Source: Ambulatory Visit | Attending: Oncology | Admitting: Oncology

## 2012-05-21 ENCOUNTER — Encounter: Payer: Self-pay | Admitting: Oncology

## 2012-05-21 VITALS — BP 109/62 | HR 109 | Temp 98.3°F | Resp 20 | Ht 66.0 in | Wt 124.5 lb

## 2012-05-21 DIAGNOSIS — D539 Nutritional anemia, unspecified: Secondary | ICD-10-CM

## 2012-05-21 DIAGNOSIS — D61818 Other pancytopenia: Secondary | ICD-10-CM

## 2012-05-21 DIAGNOSIS — C8590 Non-Hodgkin lymphoma, unspecified, unspecified site: Secondary | ICD-10-CM

## 2012-05-21 DIAGNOSIS — C8589 Other specified types of non-Hodgkin lymphoma, extranodal and solid organ sites: Secondary | ICD-10-CM | POA: Insufficient documentation

## 2012-05-21 DIAGNOSIS — C8298 Follicular lymphoma, unspecified, lymph nodes of multiple sites: Secondary | ICD-10-CM

## 2012-05-21 DIAGNOSIS — F329 Major depressive disorder, single episode, unspecified: Secondary | ICD-10-CM

## 2012-05-21 LAB — CBC WITH DIFFERENTIAL/PLATELET
Eosinophils Absolute: 0 10*3/uL (ref 0.0–0.5)
HCT: 21.2 % — ABNORMAL LOW (ref 34.8–46.6)
HGB: 7.5 g/dL — ABNORMAL LOW (ref 11.6–15.9)
LYMPH%: 32.6 % (ref 14.0–49.7)
MONO#: 0.3 10*3/uL (ref 0.1–0.9)
NEUT#: 0.9 10*3/uL — ABNORMAL LOW (ref 1.5–6.5)
NEUT%: 47.1 % (ref 38.4–76.8)
Platelets: 47 10*3/uL — ABNORMAL LOW (ref 145–400)
WBC: 1.9 10*3/uL — ABNORMAL LOW (ref 3.9–10.3)

## 2012-05-21 LAB — VITAMIN B12: Vitamin B-12: 793 pg/mL (ref 211–911)

## 2012-05-21 LAB — LACTATE DEHYDROGENASE (CC13): LDH: 289 U/L — ABNORMAL HIGH (ref 125–220)

## 2012-05-21 LAB — IRON AND TIBC
Iron: 84 ug/dL (ref 42–145)
TIBC: 271 ug/dL (ref 250–470)
UIBC: 187 ug/dL (ref 125–400)

## 2012-05-21 LAB — ABO/RH: ABO/RH(D): O POS

## 2012-05-21 LAB — PREPARE RBC (CROSSMATCH)

## 2012-05-21 NOTE — Progress Notes (Signed)
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   PAST THERAPY:  She was diagnosed with non-Hodgkin lymphoma (Follicular Stage IV) when she was 61 year old. She was successfully treated with chemotherapy and remained disease free for 29 years. She does not remember exact chemoregimen since that oncology practice is no longer in service. In 2008, she was found to have recurrent stage IV follicular disease which she underwent chemotherapy (FND-Fludarabine/Mitoxantrone/Dex?) and was in remission. Since then, Dr. Parks Ranger [phone]) has been following her with biannually PET scan. In June 2012, she had small shotty adenopathy in the neck, chest, and abdomen. Decision was for watchful observation. In October 2012, her B symptoms came back and after evaluated and confirmed by Dr. Parks Ranger with progressive disease, she received single agent weekly Rituximab x 5 doses with last dose on11/21/2012. She has since transferred her care to [facility] [place].  She started salvage Bendamustine/Rituxan on 10/09/2011 and achieved complete radiographic response on follow up PET scan.  She was mobilized with Rituxan/Cytoxan and underwent autologous BMT in May 2013.   CURRENT THERAPY:  Watchful observation.   INTERVAL HISTORY: Victoria Wood 61 y.o. female returns for a work in appointment with her daughter.  The patient was seen in the emergency room earlier this week due to chest pain and dyspnea. She was found to be anemic with a hemoglobin of 7.8. She is here to discuss her anemia as well as her CT scan findings. She is still recovering from the recent BMT.  She has been having fatigue, anorexia, weight loss.  She spends most of her time at home due to concern for infection. Patient  denies any chest pain this time. She does report dyspnea with mild exertion. She denies abdominal pain, nausea, vomiting. No palpable lymph nodes.  Patient denies fever, headache, visual changes, confusion, drenching night sweats, palpable lymph node swelling, mucositis, odynophagia, dysphagia, nausea vomiting, jaundice, chest pain, palpitation, gum bleeding, epistaxis, hematemesis, hemoptysis, abdominal pain, abdominal swelling, early satiety, melena, hematochezia, hematuria, skin rash, spontaneous bleeding, joint swelling, joint pain, heat or cold intolerance, bowel bladder incontinence, back pain, focal motor weakness, paresthesia, depression, suicidal or homocidal ideation, feeling hopelessness.   Past Medical History  Diagnosis Date  . Gastric polyps     History of  . GERD (gastroesophageal reflux disease)   . HTN (hypertension)   . Asthma   . Non Hodgkin's lymphoma   . Anemia   . Hyperlipidemia   . Complication of anesthesia     has woken up during surgery   . Pneumonia     hx of pneumonia   . Blood transfusion   . Cancer     hx of Rituxan in 07/2011  . Non-Hodgkin's lymphoma   . DM type 2 (diabetes mellitus, type 2)   . 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 Hypomagnesemia 01/13/2012    Past Surgical History  Procedure Date  . Portacath placement   . Upper gastrointestinal endoscopy 02/2011    gastric polyps x 2  . Neck and thigh surgery     lymph nodes removed  . Esophagogastroduodenoscopy 08/22/2011    Procedure: ESOPHAGOGASTRODUODENOSCOPY (EGD);  Surgeon: Louis Meckel, MD;  Location: [facility];  Service: Endoscopy;  Laterality: N/A;  . Lymph node biopsy 10/02/11    left  axillary    Current Outpatient Prescriptions  Medication Sig Dispense Refill  . acyclovir (ZOVIRAX) 800 MG tablet Take 800 mg by mouth Twice daily.      Marland Kitchen albuterol (PROVENTIL HFA;VENTOLIN HFA) 108 (90 BASE) MCG/ACT inhaler Inhale 2 puffs into the lungs every 6 (six) hours as  needed. Wheezing       . ALPRAZolam (XANAX) 0.25 MG tablet Take 0.25 mg by mouth 2 (two) times daily as needed. anxiety      . Antipyrine-Benzocaine (AURALGAN) 54-14 MG/ML SOLN Place 3 drops into both ears once a week.       . fluticasone (FLONASE) 50 MCG/ACT nasal spray Place 2 sprays into the nose daily.      . folic acid (FOLVITE) 1 MG tablet Take 1 mg by mouth Daily.      Marland Kitchen loratadine (CLARITIN) 10 MG tablet Take 10 mg by mouth daily.      . methylcellulose (ARTIFICIAL TEARS) 1 % ophthalmic solution Place 1 drop into both eyes daily as needed. dryness      . senna (SENOKOT) 8.6 MG TABS Take 2 tablets by mouth at bedtime. Constipation       . venlafaxine (EFFEXOR-XR) 75 MG 24 hr capsule Take 75 mg by mouth daily after breakfast.         ALLERGIES:  is allergic to penicillins and shellfish allergy.  REVIEW OF SYSTEMS:  The rest of the 14-point review of system was negative.   There were no vitals filed for this visit. Wt Readings from Last 3 Encounters:  03/11/12 127 lb 12.8 oz (57.97 kg)  12/31/11 137 lb 6.4 oz (62.324 kg)  12/03/11 142 lb 4.8 oz (64.547 kg)   ECOG Performance status: 1-2  PHYSICAL EXAMINATION:   General:  Thin-appearing woman, in no acute distress.  Eyes:  no scleral icterus.  ENT:  There were no oropharyngeal lesions.  Neck was without thyromegaly.  Lymphatics:  Negative cervical, supraclavicular or axillary adenopathy.  Respiratory: lungs were clear bilaterally without wheezing or crackles.  Cardiovascular:  Regular rate and rhythm, S1/S2, without murmur, rub or gallop.  There was no pedal edema.  GI:  abdomen was soft, flat, nontender, nondistended, without organomegaly.  Muscoloskeletal:  no spinal tenderness of palpation of vertebral spine.  Skin exam was without echymosis, petichae.  Neuro exam was nonfocal.  Patient was able to get on and off exam table without assistance.  Gait was normal.  Patient was alerted and oriented.  Attention was good.   Language was  appropriate.  Mood was normal without depression.  Speech was not pressured.  Thought content was not tangential.    LABORATORY/RADIOLOGY DATA:  Lab Results  Component Value Date   WBC 1.9* 05/21/2012   HGB 7.5* 05/21/2012   HCT 21.2* 05/21/2012   PLT 47* 05/21/2012   GLUCOSE 97 05/19/2012   ALKPHOS 62 05/19/2012   ALT 10 05/19/2012   AST 19 05/19/2012   NA 142 05/19/2012   K 4.1 05/19/2012   CL 108 05/19/2012   CREATININE 0.94 05/19/2012   BUN 12 05/19/2012   CO2 27 05/19/2012   HGBA1C 5.8* 09/10/2011   RADIOLOGY:  *RADIOLOGY REPORT*  Clinical Data: Intermittent dyspnea and vague chest pain today.  Status post bone marrow transplant for lymphoma.  CT ANGIOGRAPHY CHEST  Technique: Multidetector CT imaging of the chest using the  standard protocol during bolus administration of intravenous  contrast. Multiplanar reconstructed images including MIPs were  obtained and reviewed to evaluate the vascular anatomy.  Contrast: 70mL OMNIPAQUE IOHEXOL 350 MG/ML SOLN  Comparison: PET CT dated 04/01/2012 and chest CT dated 09/23/2011.  Findings: A small pericardial effusion is again demonstrated,  measuring mm in maximum thickness. Interval small bilateral  pleural effusions. Mild biapical pleural and parenchymal scarring.  Previously demonstrated enlarged lymph nodes are no longer  enlarged. There is a single remaining enlarged right pretracheal  lymph node measuring 1.1 cm in short axis diameter on image number  22. This measured 1.4 cm in short axis diameter on 09/23/2011 and  0.5 cm in short axis diameter on 04/01/2012.  The previously demonstrated 5 mm right lower lobe nodule on  04/02/1999 cysts in 13 measures 3 mm in maximum diameter today on  image number 38. No new lung nodules are seen.  Normally opacified pulmonary arteries with no pulmonary arterial  filling defects seen. Interval prominence of the interstitial  markings in the posterior aspects of both lower lobes.  Unremarkable upper  abdomen. Thoracic spine degenerative changes.  IMPRESSION:  1. No pulmonary emboli.  2. Mild changes of congestive heart failure with small bilateral  pleural effusions and mild interstitial pulmonary edema.  3. Stable small pericardial effusion.  4. Interval mild enlargement of a single right pretracheal lymph  node since 04/01/2012, suspicious for early recurrence of lymphoma.  This could also potentially represent a reactive node.  5. Interval decrease in size of the previously seen 5 mm right  lower lobe nodule, currently measuring 3 mm.  Original Report Authenticated By: Darrol Angel, M.D.    ASSESSMENT AND PLAN:   1. HTN: She has lost weight; and thus has had normal BP being off of ramipril.  2. DM, type II: She was on Metformin but is now on diet control due to weight loss.  3. Depression, anxiety: She is on Effexor XR.  4. Seasonal allergy: she is on flonase and claritine per PCP.  6. Intermittent cholestatis: From lymphoma. Recent work up did not show evidence of biliary disease.  This is resolved now.  7. 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  - s/p salvage chemo and auto BMT. - She is in remission as of pre BMT PET scan. -CT angiogram performed on 05/19/2012 did not show any evidence of pulmonary emboli but did show interval mild enlargement of a single right pretracheal lymph node since 04/01/2012 which is suspicious for early recurrence of her lymphoma. This area is very small 1.1 cm. It is too early to tell if this is indeed recurrence of her lymphoma versus a reactive process. In short term followup CT in approximately 3 months. Due to followup with [facility] in early October and they will bring a copy of this scan to their visit there. 8. Pancytopenia:  From recent chemo, and BMT.  Patient's hemoglobin of 7.5 today and she is symptomatic. We will transfuse 2 units of packed red blood cells on 05/22/2012. 9.  Fatigue; anorexia,  weight loss: from recent chemo and BMT.  These are improving as well.   10. Followup: The patient is scheduled back with me in early October. She will followup with Dr. Greggory Stallion in early October as scheduled as well.    The length of time of the face-to-face encounter was 30  minutes. More than 50% of time was spent counseling and coordination of care.

## 2012-05-22 ENCOUNTER — Ambulatory Visit (HOSPITAL_BASED_OUTPATIENT_CLINIC_OR_DEPARTMENT_OTHER): Payer: BC Managed Care – PPO

## 2012-05-22 VITALS — BP 122/70 | HR 84 | Temp 98.5°F | Resp 20

## 2012-05-22 DIAGNOSIS — T451X5A Adverse effect of antineoplastic and immunosuppressive drugs, initial encounter: Secondary | ICD-10-CM

## 2012-05-22 DIAGNOSIS — D61818 Other pancytopenia: Secondary | ICD-10-CM

## 2012-05-22 DIAGNOSIS — C8590 Non-Hodgkin lymphoma, unspecified, unspecified site: Secondary | ICD-10-CM

## 2012-05-22 DIAGNOSIS — D539 Nutritional anemia, unspecified: Secondary | ICD-10-CM

## 2012-05-22 MED ORDER — FUROSEMIDE 10 MG/ML IJ SOLN
20.0000 mg | Freq: Once | INTRAMUSCULAR | Status: AC
  Administered 2012-05-22: 20 mg via INTRAVENOUS

## 2012-05-22 MED ORDER — HEPARIN SOD (PORK) LOCK FLUSH 100 UNIT/ML IV SOLN
500.0000 [IU] | Freq: Every day | INTRAVENOUS | Status: AC | PRN
  Administered 2012-05-22: 500 [IU]
  Filled 2012-05-22: qty 5

## 2012-05-22 MED ORDER — DIPHENHYDRAMINE HCL 25 MG PO CAPS
25.0000 mg | ORAL_CAPSULE | Freq: Once | ORAL | Status: AC
  Administered 2012-05-22: 25 mg via ORAL

## 2012-05-22 MED ORDER — ACETAMINOPHEN 325 MG PO TABS
650.0000 mg | ORAL_TABLET | Freq: Once | ORAL | Status: AC
  Administered 2012-05-22: 650 mg via ORAL

## 2012-05-22 MED ORDER — SODIUM CHLORIDE 0.9 % IJ SOLN
10.0000 mL | INTRAMUSCULAR | Status: AC | PRN
  Administered 2012-05-22: 10 mL
  Filled 2012-05-22: qty 10

## 2012-05-22 MED ORDER — SODIUM CHLORIDE 0.9 % IV SOLN
250.0000 mL | Freq: Once | INTRAVENOUS | Status: AC
  Administered 2012-05-22: 250 mL via INTRAVENOUS

## 2012-05-22 NOTE — Patient Instructions (Signed)
Blood Transfusion Information  WHAT IS A BLOOD TRANSFUSION?  A transfusion is the replacement of blood or some of its parts. Blood is made up of multiple cells which provide different functions.   Red blood cells carry oxygen and are used for blood loss replacement.   White blood cells fight against infection.   Platelets control bleeding.   Plasma helps clot blood.   Other blood products are available for specialized needs, such as hemophilia or other clotting disorders.  BEFORE THE TRANSFUSION   Who gives blood for transfusions?    You may be able to donate blood to be used at a later date on yourself (autologous donation).   Relatives can be asked to donate blood. This is generally not any safer than if you have received blood from a stranger. The same precautions are taken to ensure safety when a relative's blood is donated.   Healthy volunteers who are fully evaluated to make sure their blood is safe. This is blood bank blood.  Transfusion therapy is the safest it has ever been in the practice of medicine. Before blood is taken from a donor, a complete history is taken to make sure that person has no history of diseases nor engages in risky social behavior (examples are intravenous drug use or sexual activity with multiple partners). The donor's travel history is screened to minimize risk of transmitting infections, such as malaria. The donated blood is tested for signs of infectious diseases, such as HIV and hepatitis. The blood is then tested to be sure it is compatible with you in order to minimize the chance of a transfusion reaction. If you or a relative donates blood, this is often done in anticipation of surgery and is not appropriate for emergency situations. It takes many days to process the donated blood.  RISKS AND COMPLICATIONS  Although transfusion therapy is very safe and saves many lives, the main dangers of transfusion include:    Getting an infectious disease.   Developing a  transfusion reaction. This is an allergic reaction to something in the blood you were given. Every precaution is taken to prevent this.  The decision to have a blood transfusion has been considered carefully by your caregiver before blood is given. Blood is not given unless the benefits outweigh the risks.  AFTER THE TRANSFUSION   Right after receiving a blood transfusion, you will usually feel much better and more energetic. This is especially true if your red blood cells have gotten low (anemic). The transfusion raises the level of the red blood cells which carry oxygen, and this usually causes an energy increase.   The nurse administering the transfusion will monitor you carefully for complications.  HOME CARE INSTRUCTIONS   No special instructions are needed after a transfusion. You may find your energy is better. Speak with your caregiver about any limitations on activity for underlying diseases you may have.  SEEK MEDICAL CARE IF:    Your condition is not improving after your transfusion.   You develop redness or irritation at the intravenous (IV) site.  SEEK IMMEDIATE MEDICAL CARE IF:   Any of the following symptoms occur over the next 12 hours:   Shaking chills.   You have a temperature by mouth above 102 F (38.9 C), not controlled by medicine.   Chest, back, or muscle pain.   People around you feel you are not acting correctly or are confused.   Shortness of breath or difficulty breathing.   Dizziness and fainting.     You get a rash or develop hives.   You have a decrease in urine output.   Your urine turns a dark color or changes to pink, red, or brown.  Any of the following symptoms occur over the next 10 days:   You have a temperature by mouth above 102 F (38.9 C), not controlled by medicine.   Shortness of breath.   Weakness after normal activity.   The white part of the eye turns yellow (jaundice).   You have a decrease in the amount of urine or are urinating less often.   Your  urine turns a dark color or changes to pink, red, or brown.  Document Released: 08/23/2000 Document Revised: 08/15/2011 Document Reviewed: 04/11/2008  ExitCare Patient Information 2012 [employer].

## 2012-05-24 LAB — TYPE AND SCREEN

## 2012-06-01 ENCOUNTER — Telehealth: Payer: Self-pay | Admitting: *Deleted

## 2012-06-01 NOTE — Telephone Encounter (Signed)
Called husband, Rosanne Ashing, and instructed to call [facility] for Medical Clearance to travel.  Also discussed pt is not safe to travel alone due to memory problems.  Rosanne Ashing verbalized understanding states pt has had short term memory trouble.  He will call [facility] about traveling and has arranged for pt to not be alone traveling, "I will take care of that."   He plans on taking pt on a cruise if she is able and states she will not be back in [place] by her next appt on Oct 3.  He asked to cancel that appt and he will call us to r/s when he knows she will be back in [place].

## 2012-06-01 NOTE — Telephone Encounter (Signed)
Call from Nurse Practitioner at [facility].  Reports pt's Hgb up to 10.5 on Friday 9/20.  Pt requesting permission, Medical Clearance to Fly to [place] this week.  Practitioner wants to defer to Dr. Gaylyn Rong for medical clearance to travel.  She also reports pt having some difficulty w/ memory, showing up late to visits and getting lost.  She instructed pt if she does get clearance to fly she should have Escort due to recent memory problems.  She feels it is not safe for pt to fly unaccompanied.  Asked Dr. Gaylyn Rong for his opinion if ok for pt to fly this week.  He said he wants pt to check w/ [facility] for medical clearance to fly.   Called pt's husband, Rosanne Ashing, and

## 2012-06-01 NOTE — Telephone Encounter (Signed)
Progress notes and phone notes faxed to [facility] at Fax [phone].   Called husband again to check pt is keeping f/u appt w/ [facility].  He says that appt is not until November 1st and he will make sure to contact them regarding travel and her next f/u appt w/ them.

## 2012-06-02 ENCOUNTER — Other Ambulatory Visit: Payer: Self-pay | Admitting: Oncology

## 2012-06-02 DIAGNOSIS — C8590 Non-Hodgkin lymphoma, unspecified, unspecified site: Secondary | ICD-10-CM

## 2012-06-02 NOTE — Progress Notes (Signed)
Received lab results from [facility]; forwarded to Dr. Ha. 

## 2012-06-03 ENCOUNTER — Telehealth: Payer: Self-pay | Admitting: Oncology

## 2012-06-03 NOTE — Telephone Encounter (Signed)
lvm for pt regarding oct appt....advised to come by and get schedule print out at nxt appt....mailed oct schedule to pt...sed.

## 2012-06-11 ENCOUNTER — Other Ambulatory Visit: Payer: BC Managed Care – PPO | Admitting: Lab

## 2012-06-11 ENCOUNTER — Ambulatory Visit: Payer: BC Managed Care – PPO | Admitting: Oncology

## 2012-06-12 ENCOUNTER — Telehealth: Payer: Self-pay | Admitting: Oncology

## 2012-06-12 NOTE — Telephone Encounter (Signed)
Received vm from [employer] 10/2 asking that pt's appt be cx'd. Per Jisset she is calling at the request of pt husband as they are out of town. Returned Jisset's call asking which appt she is calling about. Jisset called back 10/3 and stated she was calling re all appts for October as pt is on a cruise and will not return until the 1st wk of November and her husband will call to r/s appts. HH informed and October appts cx'd.

## 2012-06-16 ENCOUNTER — Other Ambulatory Visit: Payer: BC Managed Care – PPO | Admitting: Lab

## 2012-06-30 ENCOUNTER — Other Ambulatory Visit: Payer: BC Managed Care – PPO | Admitting: Lab

## 2012-07-10 ENCOUNTER — Telehealth: Payer: Self-pay | Admitting: *Deleted

## 2012-07-10 NOTE — Telephone Encounter (Signed)
Spouse Skylor Hughson called requesting a return call from [facility] nurse.  Report they live in [place], are working through [facility] and want to verify the orders are current/correct for the lab appt. Here.  Says they are to go to [facility] on July 29, 2012.  Will notify providers.

## 2012-07-13 ENCOUNTER — Telehealth: Payer: Self-pay | Admitting: *Deleted

## 2012-07-13 NOTE — Telephone Encounter (Signed)
Husband called to report pt returning to [facility] on 07/24/12.  She has PET scan at [facility] on 11/19 and appt for Spirometry, Echo and to see Dr. Greggory Stallion on 11/20.   He asks if pt can have lab work done here prior to her appt w/ Dr. Greggory Stallion.  Her last lab work was here on 05/21/12.  Informed husband Dr. Gaylyn Rong actually had ordered labs every 2 weeks, which pt has missed due to being in [place].  Offered to fax a Rx for CBC to [place] so pt can have labs checked there.  Husband says pt will be back in 10 days so will just wait until they come back to [place].  He wants to make sure that our labs also include those Dr. Greggory Stallion wants.  Instructed Husband to contact Dr. Hezzie Bump office to find out what labs he wants and then call us back.  We can then arrange for labs to be done here when pt returns to [place].  He verbalized understanding.

## 2012-07-14 ENCOUNTER — Other Ambulatory Visit: Payer: BC Managed Care – PPO | Admitting: Lab

## 2012-07-16 ENCOUNTER — Telehealth: Payer: Self-pay | Admitting: *Deleted

## 2012-07-16 NOTE — Telephone Encounter (Signed)
Husband called to inform that labs will be done at Dr. Hezzie Bump office on 07/29/12.  He will bring copies of labs by our office.  Informed him of pt's lab appt here on 12/03 and office visit on 12/17.   He verbalized understanding.

## 2012-07-28 ENCOUNTER — Other Ambulatory Visit: Payer: BC Managed Care – PPO

## 2012-08-11 ENCOUNTER — Other Ambulatory Visit: Payer: BC Managed Care – PPO | Admitting: Lab

## 2012-08-25 ENCOUNTER — Ambulatory Visit: Payer: BC Managed Care – PPO | Admitting: Oncology

## 2012-08-25 ENCOUNTER — Other Ambulatory Visit: Payer: BC Managed Care – PPO | Admitting: Lab

## 2012-09-07 ENCOUNTER — Other Ambulatory Visit: Payer: Self-pay | Admitting: Oncology

## 2012-09-08 ENCOUNTER — Telehealth: Payer: Self-pay | Admitting: Oncology

## 2012-09-08 NOTE — Telephone Encounter (Signed)
s.w. pt husband [facility] released pt and husband does not want to r/s appt...pt also moving back to [place]

## 2012-10-06 ENCOUNTER — Ambulatory Visit: Payer: BC Managed Care – PPO | Admitting: Oncology

## 2012-10-06 ENCOUNTER — Other Ambulatory Visit: Payer: BC Managed Care – PPO | Admitting: Lab

## 2012-12-08 ENCOUNTER — Other Ambulatory Visit: Payer: Self-pay | Admitting: Surgery

## 2013-09-10 IMAGING — US US ABDOMEN COMPLETE
1 series · 14 of 25 positions shown · non-contrast
Comparison: CT 08/21/2011

CLINICAL DATA: Right upper quadrant pain and elevated liver
function studies.

COMPLETE ABDOMINAL ULTRASOUND

[Series 1: us abdomen complete · 0.30mm/px · 14 of 42 slices shown]
[im 1/42]
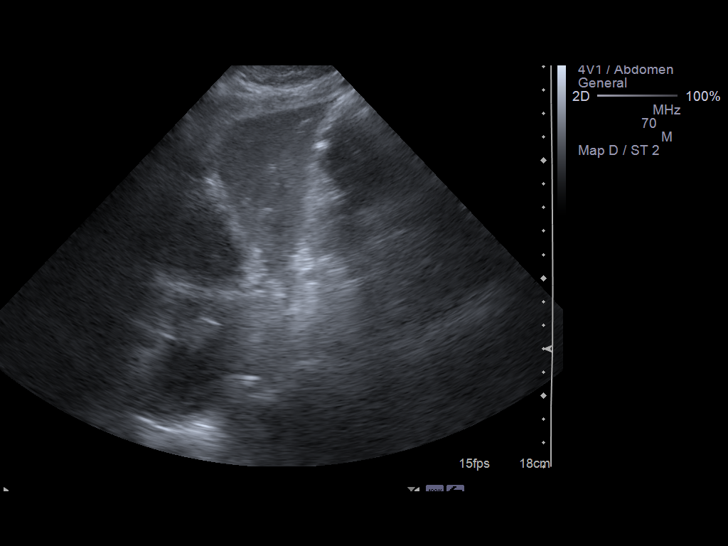
[im 4/42]
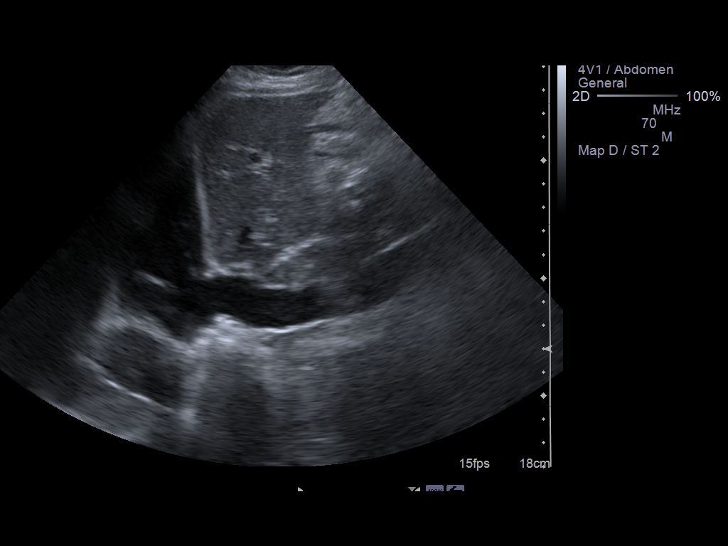
[im 7/42]
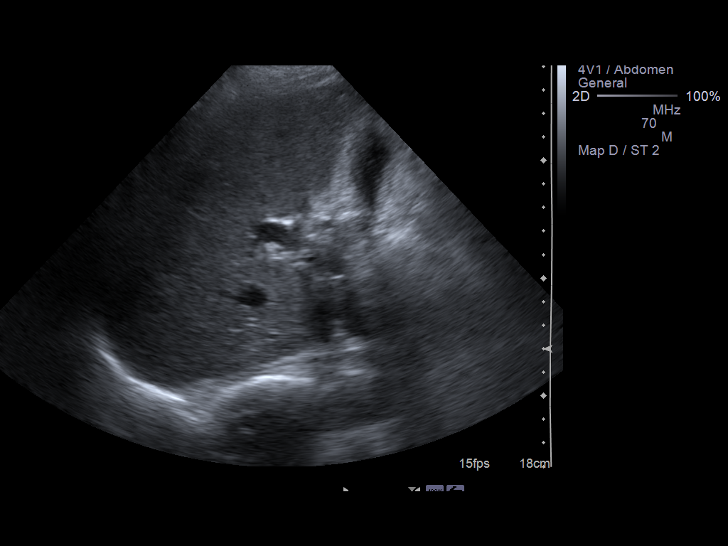
[im 11/42]
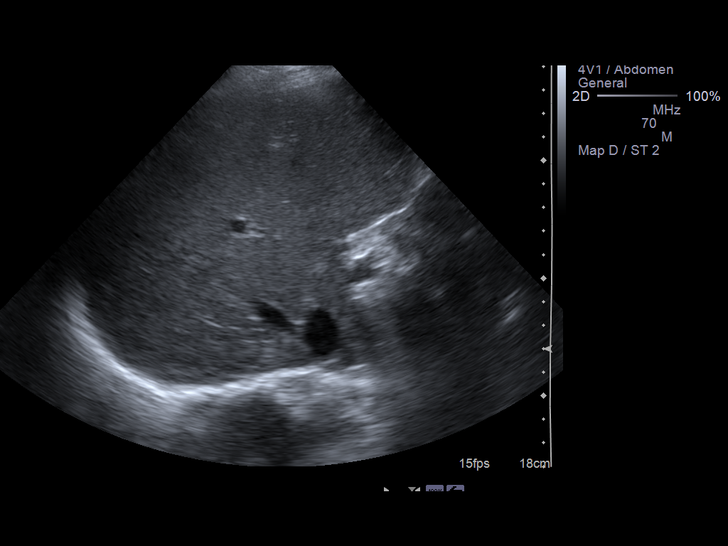
[im 14/42]
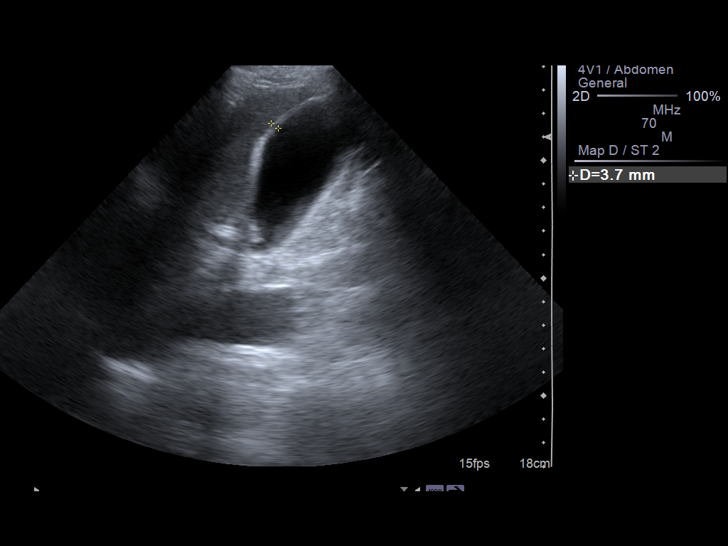
[im 16/42]
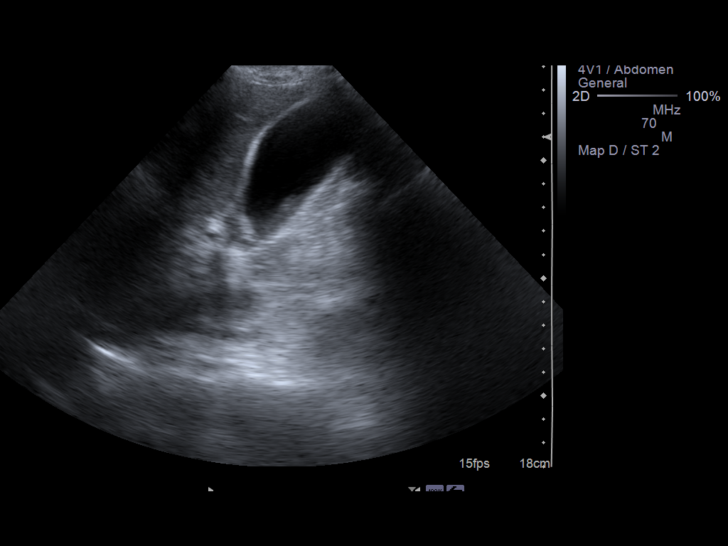
[im 19/42]
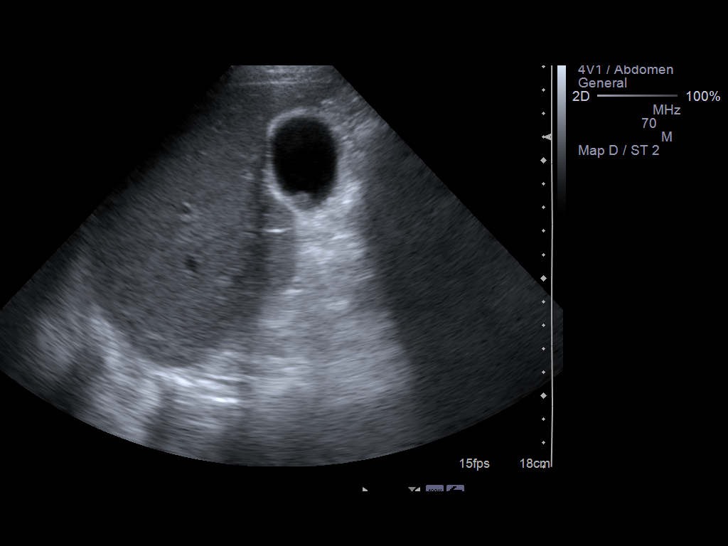
[im 23/42]
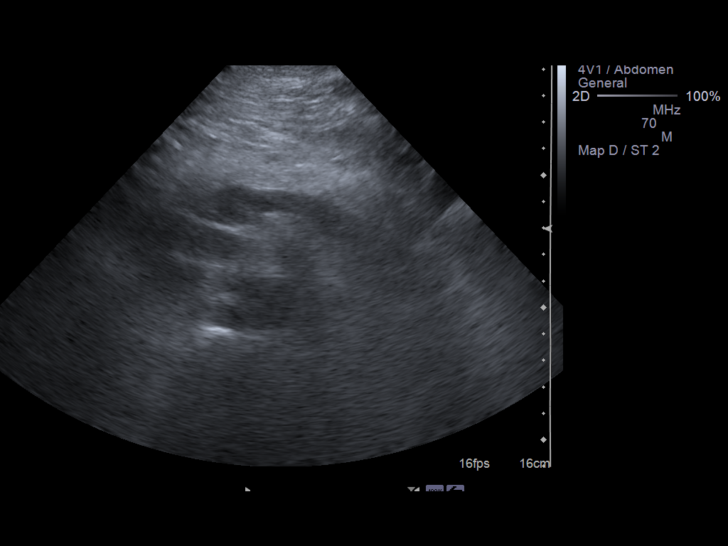
[im 26/42]
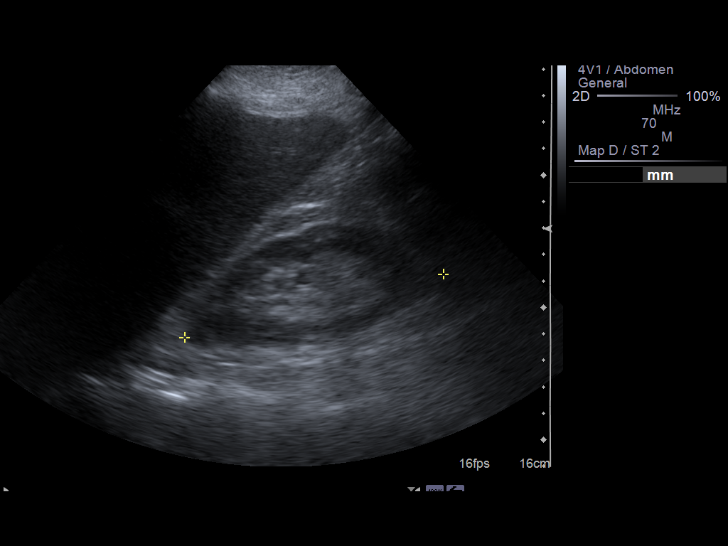
[im 28/42]
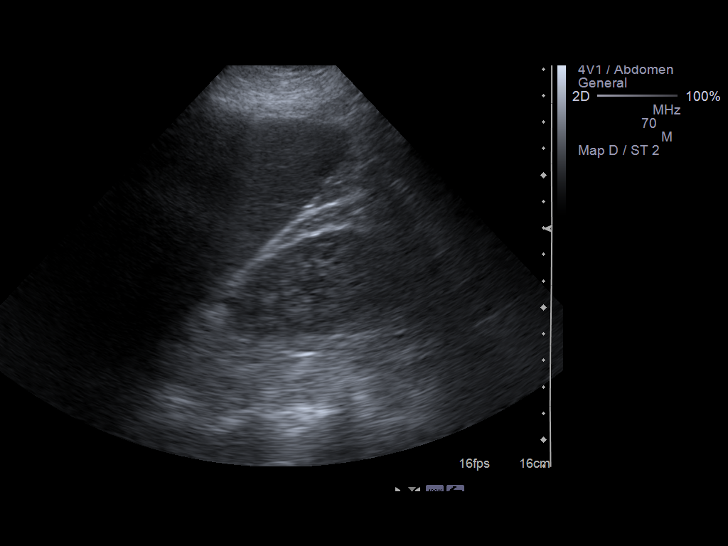
[im 31/42]
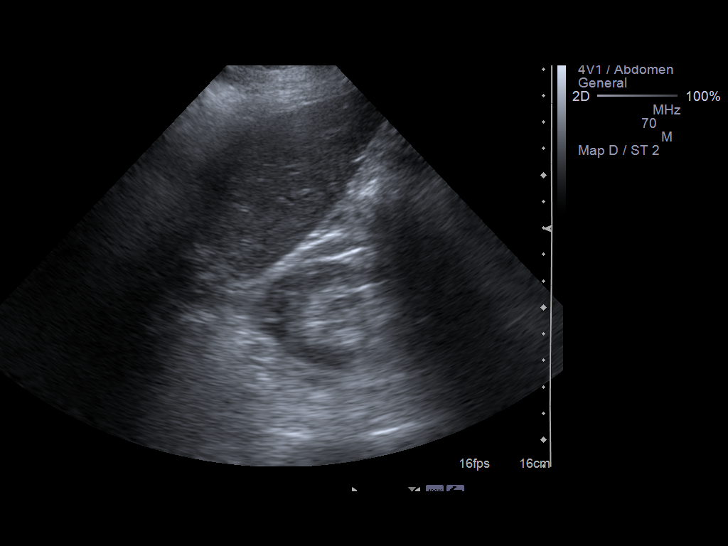
[im 35/42]
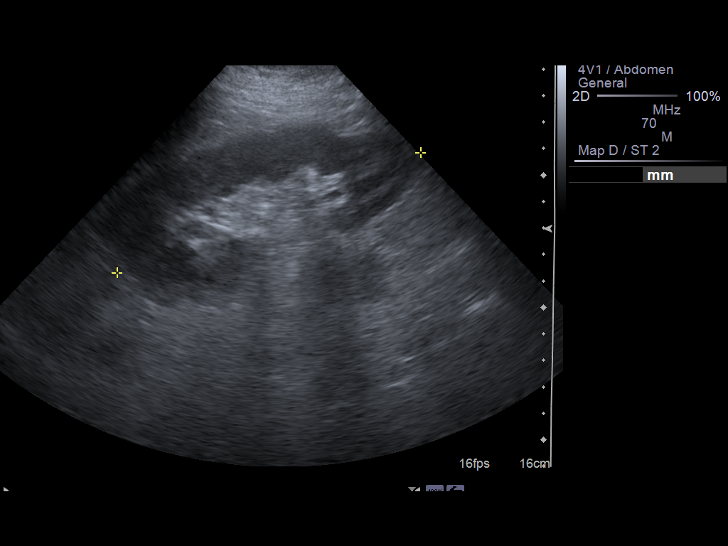
[im 38/42]
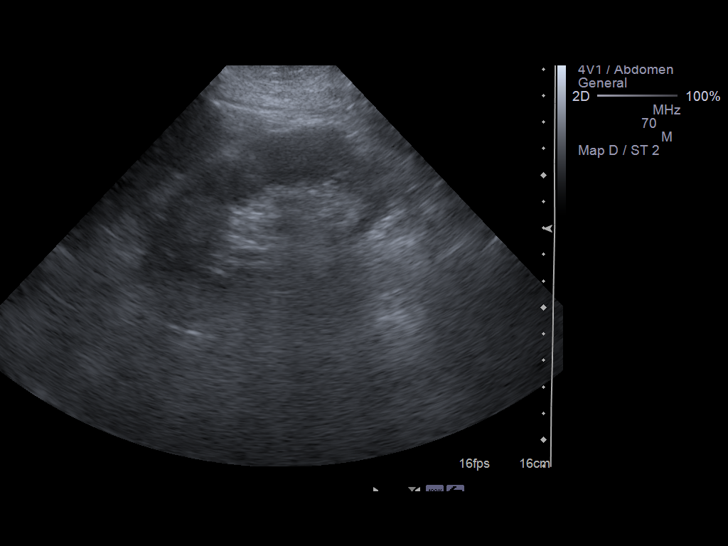
[im 42/42]
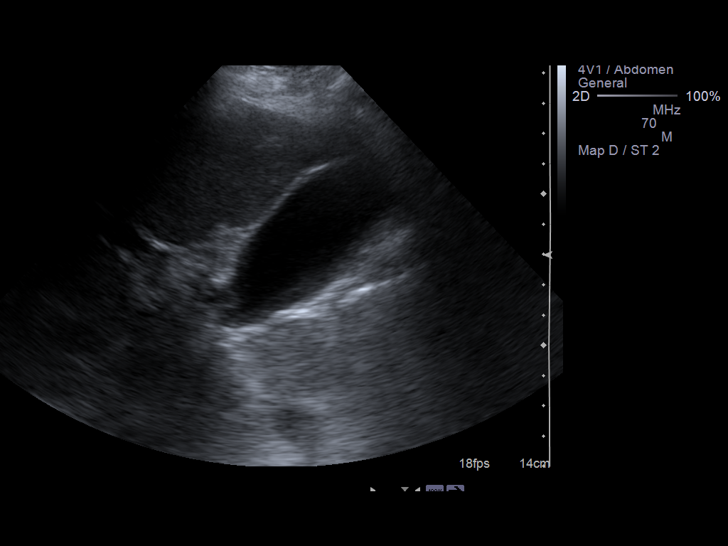

[14 of 25 positions shown; findings below may reference images not displayed]

FINDINGS: Gallbladder:  There is layering sludge in the gallbladder with mild
wall thickening.  No stones are visualized. Murphy's sign is
indeterminate due to patient sedation.

Common bile duct:  Normal caliber with measured diameter of about 4
mm.

Liver:  No focal lesion identified.  Within normal limits in
parenchymal echogenicity.

IVC:  Appears normal.

Pancreas:  The pancreas is mostly obscured by overlying bowel gas
is not well visualized.

Spleen:  Spleen length measures 10.3 cm.  Normal homogeneous
parenchymal echotexture.

Right Kidney:  Right kidney length measures 10.4 cm.  No
hydronephrosis.

Left Kidney:  Left kidney measures 12.3 cm length.  No
hydronephrosis.

Abdominal aorta:  No aneurysm identified.
IMPRESSION: Focal gallbladder sludge and gallbladder wall thickening without
obvious shadowing stones.  Nonspecific etiology.

## 2013-09-11 IMAGING — CR DG ABDOMEN 2V
3 series · 3 of 3 positions shown · non-contrast
Comparison: Abdomen and pelvis CT dated 08/21/2011.

CLINICAL DATA: Right mid abdomen and back pain for the past 3
weeks.  Constipation.

ABDOMEN - 2 VIEW

[w abdomen upright (1 of 2)]
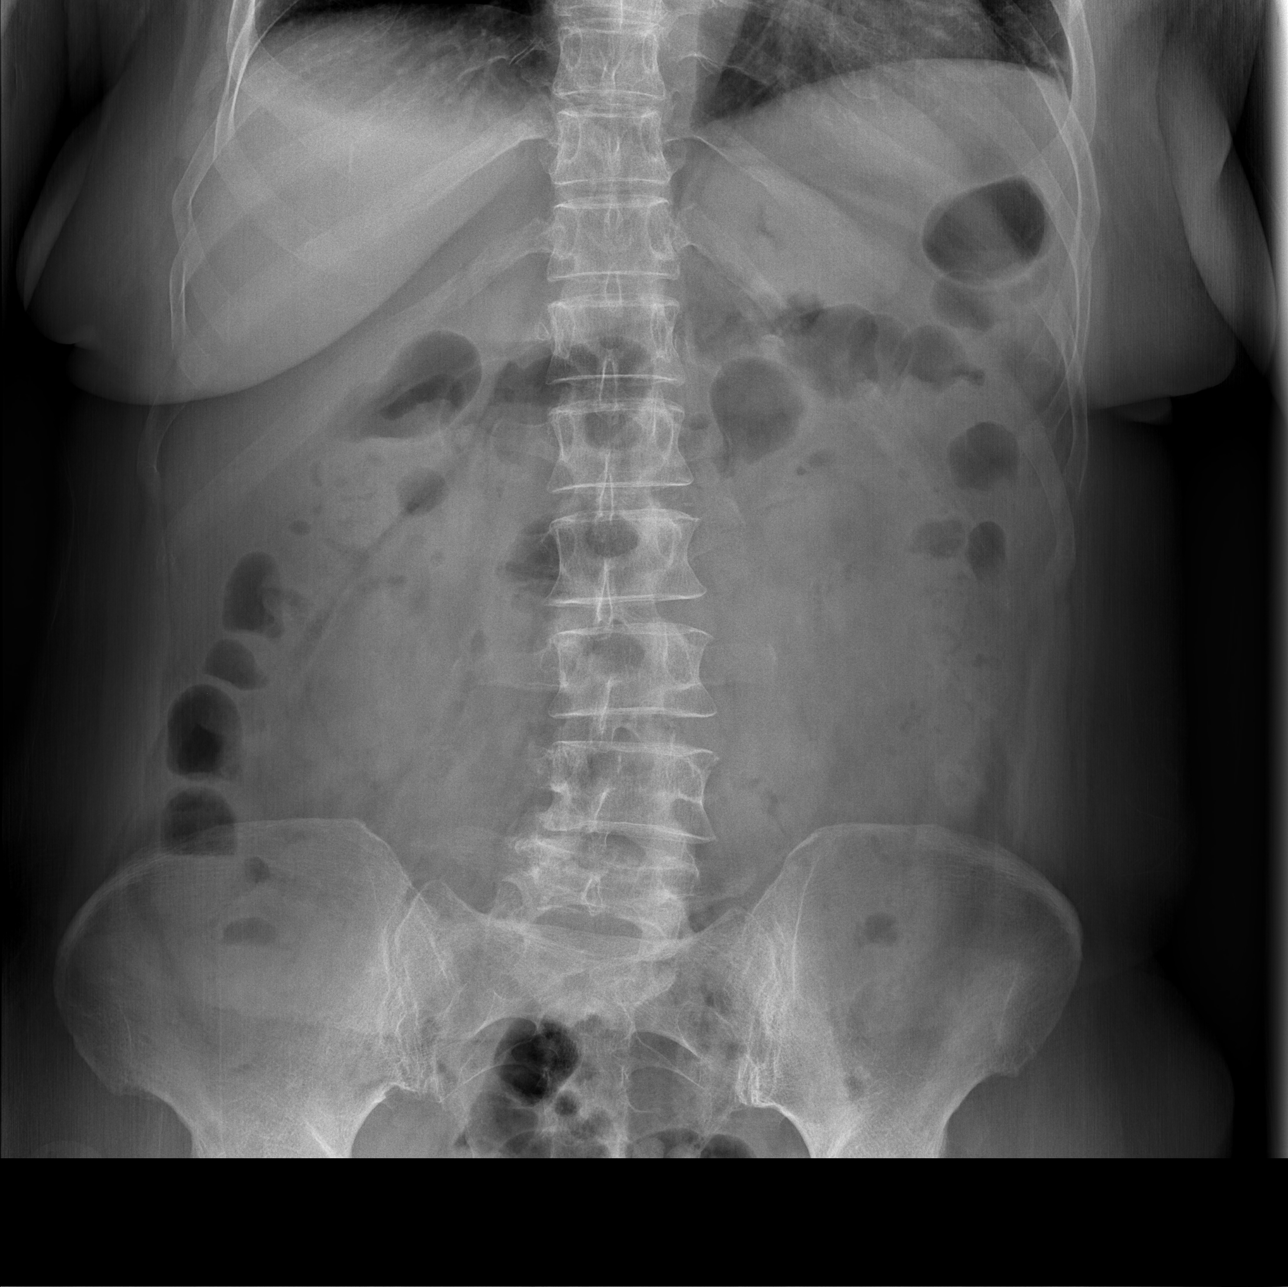

[w abdomen upright (2 of 2)]
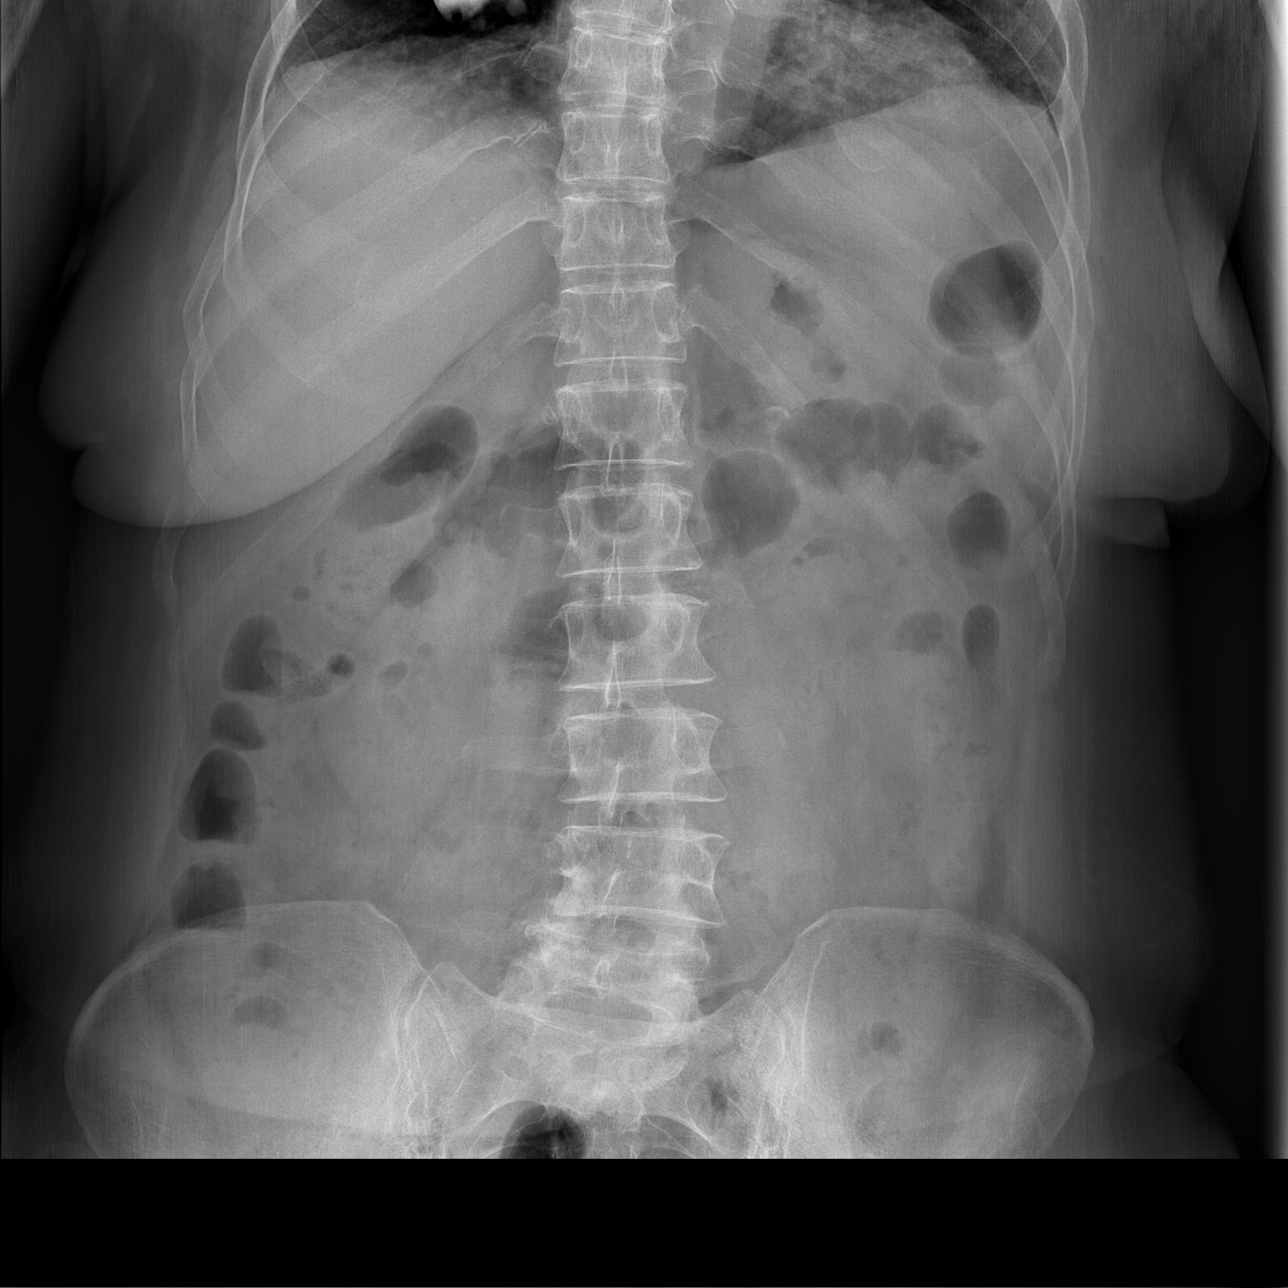

[t abdomen supine]
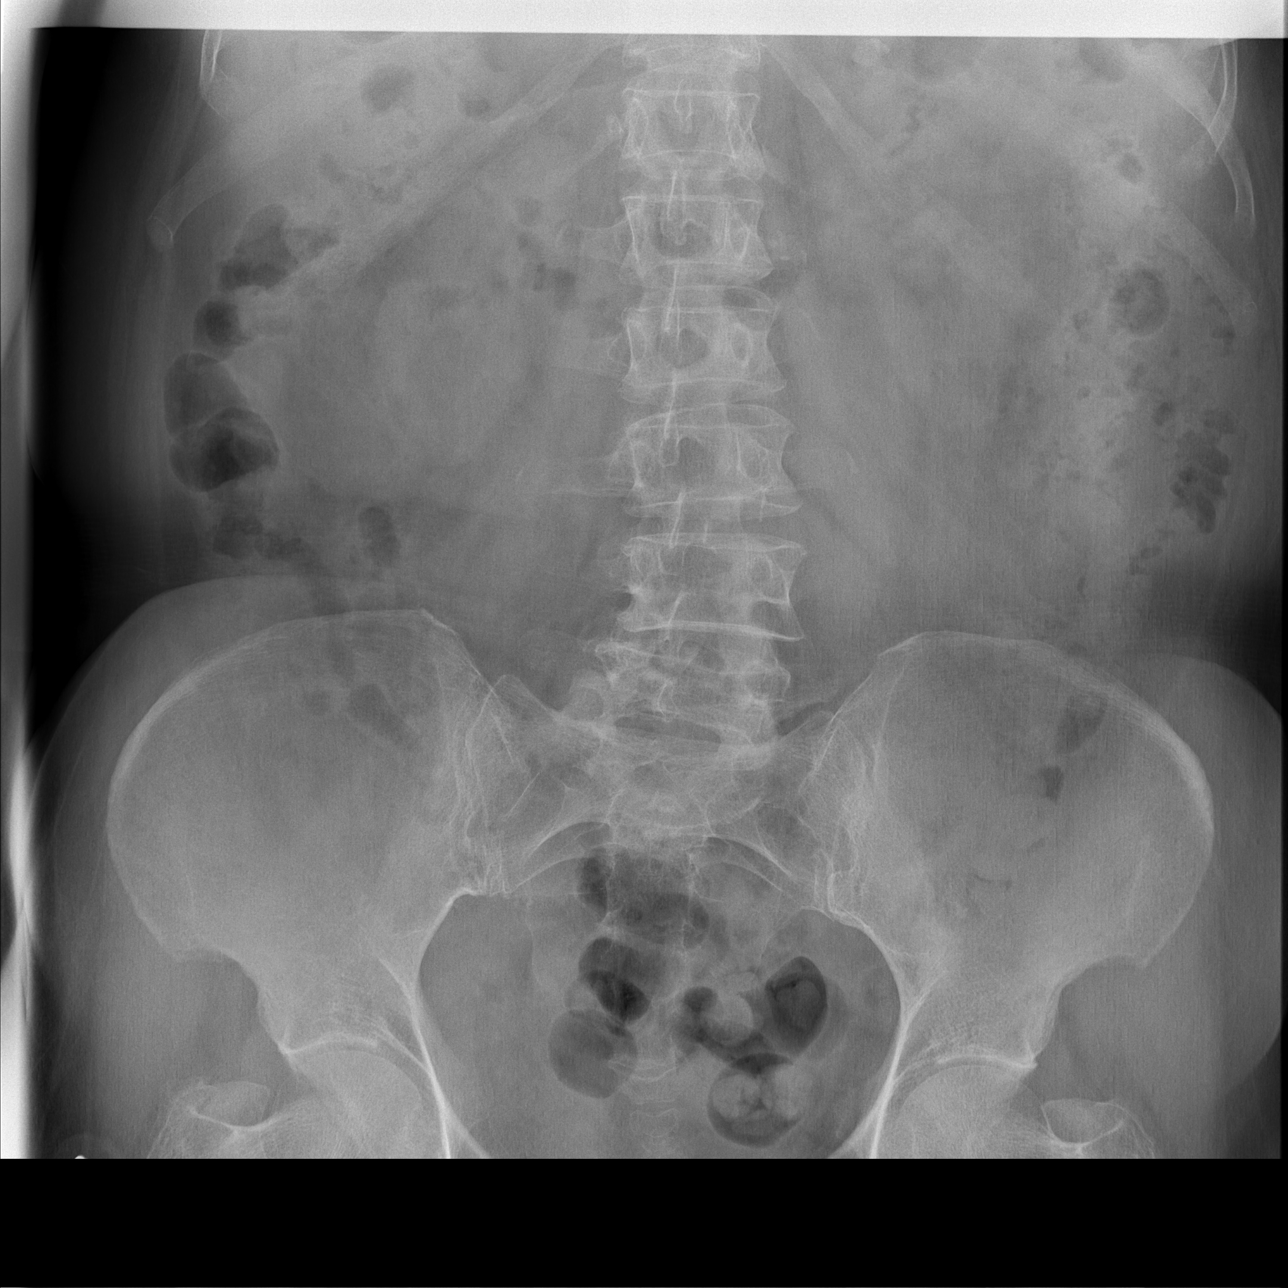

[3 of 3 positions shown; findings below may reference images not displayed]

FINDINGS: Normal bowel gas pattern without free peritoneal air.
Mild scoliosis.  Diffuse osteopenia.
IMPRESSION: No acute abnormality.

## 2013-09-11 IMAGING — NM NM HEPATOBILIARY IMAGE, INC GB
1 series · 6 of 6 positions shown · non-contrast
Comparison: None.

CLINICAL DATA: Quadrant pain

NUCLEAR MEDICINE HEPATOBILIARY IMAGING
TECHNIQUE: Sequential images of the abdomen were obtained [DATE] minutes following intravenous administration of
radiopharmaceutical.
Radiopharmaceutical:  5.0 mCi Gc-77m Choletec

[he hepatobiliary · 3.43mm/px · 6 of 60 frames shown]
[frame 6/60]
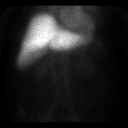
[frame 16/60]
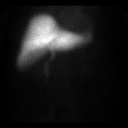
[frame 26/60]
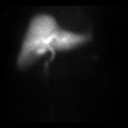
[frame 36/60]
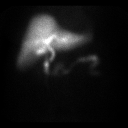
[frame 46/60]
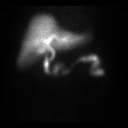
[frame 56/60]
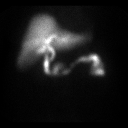

[6 of 6 positions shown; findings below may reference images not displayed]

FINDINGS: Gallbladder activity occurs after 1 hour.  Small bowel
activity occurs after 30 minutes.
IMPRESSION: Cystic and common bile ducts are patent.

## 2013-09-14 ENCOUNTER — Telehealth: Payer: Self-pay | Admitting: Hematology and Oncology

## 2013-09-14 ENCOUNTER — Other Ambulatory Visit: Payer: Self-pay | Admitting: Hematology and Oncology

## 2013-09-14 NOTE — Telephone Encounter (Signed)
LVMM adv add on appt for 01/08 with Dr Alvy Bimler per 1/6 POF shh

## 2013-09-16 ENCOUNTER — Ambulatory Visit: Payer: BC Managed Care – PPO | Admitting: Hematology and Oncology

## 2013-09-21 ENCOUNTER — Telehealth: Payer: Self-pay | Admitting: *Deleted

## 2013-09-21 NOTE — Telephone Encounter (Signed)
Husband called states pt missed a call from [place] last Tuesday and just saw it on her cell phone.  Informed husband pt was left a message about appt w/ Dr. Alvy Bimler for last Thursday.   Dr. Alvy Bimler can see pt this Friday at 8:30 am.   He is not sure if pt can make it on Friday.   He is in [place] and pt is in [place] staying with their daughter right now.  Husband reports pt has "early dementia" and better to contact him w/ appt information.   He will call us back to confirm if pt can make it for appt this Friday at 8:30 am.   Husband returned call and confirmed appt w/ Dr. Alvy Bimler on 1/6 at 8:30 am.  POF sent.

## 2013-09-22 ENCOUNTER — Telehealth: Payer: Self-pay | Admitting: Hematology and Oncology

## 2013-09-22 ENCOUNTER — Other Ambulatory Visit: Payer: Self-pay | Admitting: *Deleted

## 2013-09-22 NOTE — Telephone Encounter (Signed)
added f/u for 1/16 @ 8:30am per 1/14 pof. per pof pt aware. lmonvm for desk nurse informing her that there is already a pt in this slot and to let me know if this pt should be moved

## 2013-09-23 ENCOUNTER — Telehealth: Payer: Self-pay | Admitting: *Deleted

## 2013-09-23 NOTE — Telephone Encounter (Signed)
Husband called to cancel appointment with Dr Alvy Bimler. Pt is thinking about moving to [place] to be with husband and take treatment there. They will be in touch with their decision

## 2013-09-24 ENCOUNTER — Ambulatory Visit: Payer: BC Managed Care – PPO | Admitting: Hematology and Oncology

## 2014-03-30 ENCOUNTER — Telehealth: Payer: Self-pay | Admitting: *Deleted

## 2014-03-30 NOTE — Telephone Encounter (Signed)
Received call from husband Clair Gulling wanting to know re:  Pt has appt to see Dr. Lourena Simmonds at [facility] next Tues. 04/05/14.  Clair Gulling wanted to know if md's office had requested any records from our office yet.   Spoke with Clair Gulling, and informed him that nurse has not received any calls nor request from [place] yet.  Instructed Jim to have pt sign a release of information form and have the office fax to [place], Freight forwarder of [facility] to obtain records.  Clair Gulling voiced understanding. Jim's  Phone   [phone].

## 2014-10-20 ENCOUNTER — Other Ambulatory Visit: Payer: Self-pay | Admitting: Pharmacist

## 2015-03-01 ENCOUNTER — Emergency Department (HOSPITAL_COMMUNITY): Payer: Medicare Other

## 2015-03-01 ENCOUNTER — Inpatient Hospital Stay (HOSPITAL_COMMUNITY)
Admission: EM | Admit: 2015-03-01 | Discharge: 2015-03-11 | DRG: 291 | Disposition: A | Discharge status: Home / Self Care | Payer: Medicare Other | Attending: Internal Medicine | Admitting: Internal Medicine

## 2015-03-01 ENCOUNTER — Encounter (HOSPITAL_COMMUNITY): Payer: Self-pay | Admitting: *Deleted

## 2015-03-01 DIAGNOSIS — C8590 Non-Hodgkin lymphoma, unspecified, unspecified site: Secondary | ICD-10-CM | POA: Diagnosis present

## 2015-03-01 DIAGNOSIS — R000 Tachycardia, unspecified: Secondary | ICD-10-CM | POA: Diagnosis present

## 2015-03-01 DIAGNOSIS — I313 Pericardial effusion (noninflammatory): Secondary | ICD-10-CM | POA: Diagnosis present

## 2015-03-01 DIAGNOSIS — F0390 Unspecified dementia without behavioral disturbance: Secondary | ICD-10-CM | POA: Diagnosis not present

## 2015-03-01 DIAGNOSIS — K7589 Other specified inflammatory liver diseases: Secondary | ICD-10-CM

## 2015-03-01 DIAGNOSIS — Z794 Long term (current) use of insulin: Secondary | ICD-10-CM | POA: Diagnosis not present

## 2015-03-01 DIAGNOSIS — K219 Gastro-esophageal reflux disease without esophagitis: Secondary | ICD-10-CM | POA: Diagnosis present

## 2015-03-01 DIAGNOSIS — E118 Type 2 diabetes mellitus with unspecified complications: Secondary | ICD-10-CM | POA: Diagnosis not present

## 2015-03-01 DIAGNOSIS — I509 Heart failure, unspecified: Secondary | ICD-10-CM

## 2015-03-01 DIAGNOSIS — Z880 Allergy status to penicillin: Secondary | ICD-10-CM | POA: Diagnosis not present

## 2015-03-01 DIAGNOSIS — R0602 Shortness of breath: Secondary | ICD-10-CM | POA: Diagnosis present

## 2015-03-01 DIAGNOSIS — Z515 Encounter for palliative care: Secondary | ICD-10-CM | POA: Diagnosis not present

## 2015-03-01 DIAGNOSIS — D61810 Antineoplastic chemotherapy induced pancytopenia: Secondary | ICD-10-CM | POA: Diagnosis present

## 2015-03-01 DIAGNOSIS — I5021 Acute systolic (congestive) heart failure: Principal | ICD-10-CM | POA: Diagnosis present

## 2015-03-01 DIAGNOSIS — D61818 Other pancytopenia: Secondary | ICD-10-CM | POA: Diagnosis present

## 2015-03-01 DIAGNOSIS — J45909 Unspecified asthma, uncomplicated: Secondary | ICD-10-CM | POA: Diagnosis present

## 2015-03-01 DIAGNOSIS — I480 Paroxysmal atrial fibrillation: Secondary | ICD-10-CM | POA: Diagnosis not present

## 2015-03-01 DIAGNOSIS — J219 Acute bronchiolitis, unspecified: Secondary | ICD-10-CM | POA: Diagnosis present

## 2015-03-01 DIAGNOSIS — E119 Type 2 diabetes mellitus without complications: Secondary | ICD-10-CM

## 2015-03-01 DIAGNOSIS — I10 Essential (primary) hypertension: Secondary | ICD-10-CM | POA: Diagnosis present

## 2015-03-01 DIAGNOSIS — I083 Combined rheumatic disorders of mitral, aortic and tricuspid valves: Secondary | ICD-10-CM | POA: Diagnosis present

## 2015-03-01 DIAGNOSIS — I959 Hypotension, unspecified: Secondary | ICD-10-CM | POA: Diagnosis present

## 2015-03-01 DIAGNOSIS — N189 Chronic kidney disease, unspecified: Secondary | ICD-10-CM | POA: Diagnosis not present

## 2015-03-01 DIAGNOSIS — E1122 Type 2 diabetes mellitus with diabetic chronic kidney disease: Secondary | ICD-10-CM | POA: Diagnosis not present

## 2015-03-01 DIAGNOSIS — G4700 Insomnia, unspecified: Secondary | ICD-10-CM

## 2015-03-01 DIAGNOSIS — I4892 Unspecified atrial flutter: Secondary | ICD-10-CM | POA: Diagnosis present

## 2015-03-01 DIAGNOSIS — I420 Dilated cardiomyopathy: Secondary | ICD-10-CM | POA: Diagnosis present

## 2015-03-01 DIAGNOSIS — I471 Supraventricular tachycardia: Secondary | ICD-10-CM | POA: Diagnosis not present

## 2015-03-01 DIAGNOSIS — T451X5A Adverse effect of antineoplastic and immunosuppressive drugs, initial encounter: Secondary | ICD-10-CM | POA: Diagnosis present

## 2015-03-01 DIAGNOSIS — R1013 Epigastric pain: Secondary | ICD-10-CM

## 2015-03-01 DIAGNOSIS — Z9481 Bone marrow transplant status: Secondary | ICD-10-CM | POA: Diagnosis not present

## 2015-03-01 DIAGNOSIS — E785 Hyperlipidemia, unspecified: Secondary | ICD-10-CM | POA: Diagnosis present

## 2015-03-01 DIAGNOSIS — I483 Typical atrial flutter: Secondary | ICD-10-CM | POA: Diagnosis not present

## 2015-03-01 DIAGNOSIS — F329 Major depressive disorder, single episode, unspecified: Secondary | ICD-10-CM | POA: Diagnosis present

## 2015-03-01 DIAGNOSIS — Z91013 Allergy to seafood: Secondary | ICD-10-CM | POA: Diagnosis not present

## 2015-03-01 DIAGNOSIS — I4819 Other persistent atrial fibrillation: Secondary | ICD-10-CM | POA: Insufficient documentation

## 2015-03-01 DIAGNOSIS — I4891 Unspecified atrial fibrillation: Secondary | ICD-10-CM | POA: Diagnosis present

## 2015-03-01 DIAGNOSIS — I481 Persistent atrial fibrillation: Secondary | ICD-10-CM | POA: Diagnosis not present

## 2015-03-01 DIAGNOSIS — E876 Hypokalemia: Secondary | ICD-10-CM | POA: Diagnosis present

## 2015-03-01 HISTORY — DX: Unspecified dementia, unspecified severity, without behavioral disturbance, psychotic disturbance, mood disturbance, and anxiety: F03.90

## 2015-03-01 HISTORY — DX: Major depressive disorder, single episode, unspecified: F32.9

## 2015-03-01 HISTORY — DX: Depression, unspecified: F32.A

## 2015-03-01 HISTORY — DX: Personal history of other medical treatment: Z92.89

## 2015-03-01 LAB — CBC WITH DIFFERENTIAL/PLATELET
Basophils Absolute: 0 10*3/uL (ref 0.0–0.1)
Basophils Relative: 1 % (ref 0–1)
EOS ABS: 0 10*3/uL (ref 0.0–0.7)
EOS PCT: 1 % (ref 0–5)
HCT: 25.7 % — ABNORMAL LOW (ref 36.0–46.0)
Hemoglobin: 9 g/dL — ABNORMAL LOW (ref 12.0–15.0)
LYMPHS ABS: 0.9 10*3/uL (ref 0.7–4.0)
Lymphocytes Relative: 29 % (ref 12–46)
MCH: 35.6 pg — AB (ref 26.0–34.0)
MCHC: 35 g/dL (ref 30.0–36.0)
MCV: 101.6 fL — AB (ref 78.0–100.0)
Monocytes Absolute: 0.4 10*3/uL (ref 0.1–1.0)
Monocytes Relative: 12 % (ref 3–12)
Neutro Abs: 1.7 10*3/uL (ref 1.7–7.7)
Neutrophils Relative %: 57 % (ref 43–77)
Platelets: 55 10*3/uL — ABNORMAL LOW (ref 150–400)
RBC: 2.53 MIL/uL — AB (ref 3.87–5.11)
RDW: 19 % — ABNORMAL HIGH (ref 11.5–15.5)
WBC: 2.9 10*3/uL — ABNORMAL LOW (ref 4.0–10.5)

## 2015-03-01 LAB — COMPREHENSIVE METABOLIC PANEL
ALT: 16 U/L (ref 14–54)
ANION GAP: 8 (ref 5–15)
AST: 19 U/L (ref 15–41)
Albumin: 3.3 g/dL — ABNORMAL LOW (ref 3.5–5.0)
Alkaline Phosphatase: 77 U/L (ref 38–126)
BILIRUBIN TOTAL: 0.7 mg/dL (ref 0.3–1.2)
BUN: 22 mg/dL — ABNORMAL HIGH (ref 6–20)
CHLORIDE: 110 mmol/L (ref 101–111)
CO2: 21 mmol/L — ABNORMAL LOW (ref 22–32)
Calcium: 8.8 mg/dL — ABNORMAL LOW (ref 8.9–10.3)
Creatinine, Ser: 0.92 mg/dL (ref 0.44–1.00)
GFR calc Af Amer: >60 mL/min (ref >60)
GFR calc non Af Amer: >60 mL/min (ref >60)
Glucose, Bld: 138 mg/dL — ABNORMAL HIGH (ref 65–99)
Potassium: 3.7 mmol/L (ref 3.5–5.1)
Sodium: 139 mmol/L (ref 135–145)
TOTAL PROTEIN: 5.2 g/dL — AB (ref 6.5–8.1)

## 2015-03-01 LAB — URINALYSIS, ROUTINE W REFLEX MICROSCOPIC
Bilirubin Urine: NEGATIVE
GLUCOSE, UA: NEGATIVE mg/dL
Hgb urine dipstick: NEGATIVE
Ketones, ur: NEGATIVE mg/dL
Leukocytes, UA: NEGATIVE
Nitrite: NEGATIVE
PH: 5 (ref 5.0–8.0)
Protein, ur: NEGATIVE mg/dL
Specific Gravity, Urine: 1.009 (ref 1.005–1.030)
Urobilinogen, UA: 0.2 mg/dL (ref 0.0–1.0)

## 2015-03-01 LAB — I-STAT ARTERIAL BLOOD GAS, ED
Acid-base deficit: 6 mmol/L — ABNORMAL HIGH (ref 0.0–2.0)
Bicarbonate: 18.2 mEq/L — ABNORMAL LOW (ref 20.0–24.0)
O2 SAT: 98 %
PH ART: 7.379 (ref 7.350–7.450)
Patient temperature: 98.6
TCO2: 19 mmol/L (ref 0–100)
pCO2 arterial: 30.8 mmHg — ABNORMAL LOW (ref 35.0–45.0)
pO2, Arterial: 103 mmHg — ABNORMAL HIGH (ref 80.0–100.0)

## 2015-03-01 LAB — I-STAT CHEM 8, ED
BUN: 25 mg/dL — ABNORMAL HIGH (ref 6–20)
Calcium, Ion: 1.2 mmol/L (ref 1.13–1.30)
Chloride: 106 mmol/L (ref 101–111)
Creatinine, Ser: 0.8 mg/dL (ref 0.44–1.00)
Glucose, Bld: 137 mg/dL — ABNORMAL HIGH (ref 65–99)
HEMATOCRIT: 27 % — AB (ref 36.0–46.0)
HEMOGLOBIN: 9.2 g/dL — AB (ref 12.0–15.0)
Potassium: 3.7 mmol/L (ref 3.5–5.1)
Sodium: 139 mmol/L (ref 135–145)
TCO2: 20 mmol/L (ref 0–100)

## 2015-03-01 LAB — BRAIN NATRIURETIC PEPTIDE: B Natriuretic Peptide: 2628.2 pg/mL — ABNORMAL HIGH (ref 0.0–100.0)

## 2015-03-01 LAB — I-STAT CG4 LACTIC ACID, ED
LACTIC ACID, VENOUS: 0.45 mmol/L — AB (ref 0.5–2.0)
Lactic Acid, Venous: 1.07 mmol/L (ref 0.5–2.0)

## 2015-03-01 LAB — I-STAT TROPONIN, ED: TROPONIN I, POC: 0.06 ng/mL (ref 0.00–0.08)

## 2015-03-01 LAB — D-DIMER, QUANTITATIVE: D-Dimer, Quant: 1.01 ug/mL-FEU — ABNORMAL HIGH (ref 0.00–0.48)

## 2015-03-01 MED ORDER — ALBUTEROL SULFATE (2.5 MG/3ML) 0.083% IN NEBU
2.5000 mg | INHALATION_SOLUTION | Freq: Once | RESPIRATORY_TRACT | Status: AC
  Administered 2015-03-01: 2.5 mg via RESPIRATORY_TRACT
  Filled 2015-03-01: qty 3

## 2015-03-01 MED ORDER — METHYLPREDNISOLONE SODIUM SUCC 125 MG IJ SOLR
125.0000 mg | Freq: Once | INTRAMUSCULAR | Status: AC
  Administered 2015-03-01: 125 mg via INTRAVENOUS
  Filled 2015-03-01: qty 2

## 2015-03-01 MED ORDER — VANCOMYCIN HCL IN DEXTROSE 1-5 GM/200ML-% IV SOLN
1000.0000 mg | Freq: Once | INTRAVENOUS | Status: AC
  Administered 2015-03-01: 1000 mg via INTRAVENOUS
  Filled 2015-03-01: qty 200

## 2015-03-01 MED ORDER — SODIUM CHLORIDE 0.9 % IV BOLUS (SEPSIS)
1000.0000 mL | Freq: Once | INTRAVENOUS | Status: AC
  Administered 2015-03-01: 1000 mL via INTRAVENOUS

## 2015-03-01 MED ORDER — IPRATROPIUM-ALBUTEROL 0.5-2.5 (3) MG/3ML IN SOLN
3.0000 mL | Freq: Once | RESPIRATORY_TRACT | Status: AC
  Administered 2015-03-01: 3 mL via RESPIRATORY_TRACT
  Filled 2015-03-01: qty 3

## 2015-03-01 MED ORDER — TECHNETIUM TO 99M ALBUMIN AGGREGATED
6.0000 | Freq: Once | INTRAVENOUS | Status: AC | PRN
  Administered 2015-03-01: 6 via INTRAVENOUS

## 2015-03-01 MED ORDER — PIPERACILLIN-TAZOBACTAM 3.375 G IVPB 30 MIN
3.3750 g | Freq: Once | INTRAVENOUS | Status: AC
  Administered 2015-03-01: 3.375 g via INTRAVENOUS
  Filled 2015-03-01: qty 50

## 2015-03-01 MED ORDER — TECHNETIUM TC 99M DIETHYLENETRIAME-PENTAACETIC ACID: 40.0000 | Freq: Once | INTRAVENOUS | Status: AC | PRN

## 2015-03-01 NOTE — ED Notes (Addendum)
Pt in c/o cough since yesterday, dyspnea with exertion, pt states she normally has some problems with this but not this bad, no distress noted, pt is currently being treated for lymphoma

## 2015-03-01 NOTE — ED Notes (Signed)
Pt arrived in room

## 2015-03-01 NOTE — Progress Notes (Signed)
Report taken from Sarah, RN in ED.  

## 2015-03-01 NOTE — ED Provider Notes (Signed)
CSN: [id number]     Arrival date & time 03/01/15  1416 History   First MD Initiated Contact with Patient 03/01/15 1600     Chief Complaint  Patient presents with  . Cough     (Consider location/radiation/quality/duration/timing/severity/associated sxs/prior Treatment) Patient is a 64 y.o. female presenting with cough. The history is provided by the patient (the pt complains of sob and cough).  Cough Cough characteristics:  Non-productive Severity:  Moderate Onset quality:  Gradual Timing:  Constant Progression:  Waxing and waning Chronicity:  New Associated symptoms: shortness of breath   Associated symptoms: no chest pain, no eye discharge, no headaches and no rash     Past Medical History  Diagnosis Date  . Gastric polyps     History of  . GERD (gastroesophageal reflux disease)   . HTN (hypertension)   . Asthma   . Non Hodgkin's lymphoma   . Anemia   . Hyperlipidemia   . Complication of anesthesia     has woken up during surgery   . Pneumonia     hx of pneumonia   . Blood transfusion   . Cancer     hx of Rituxan in 07/2011  . Non-Hodgkin's lymphoma   . DM type 2 (diabetes mellitus, type 2)   . 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 Hypomagnesemia 01/13/2012   Past Surgical History  Procedure Laterality Date  . Portacath placement    . Upper gastrointestinal endoscopy  02/2011    gastric polyps x 2  . Neck and thigh surgery      lymph nodes removed  . Esophagogastroduodenoscopy  08/22/2011    Procedure: ESOPHAGOGASTRODUODENOSCOPY (EGD);  Surgeon: Inda Castle, MD;  Location: [facility];  Service: Endoscopy;  Laterality: N/A;  . Lymph node biopsy  10/02/11    left axillary   Family History  Problem Relation Age of Onset  . Hypertension Mother   . Cancer Mother     breast  . Hypertension Father   . Kidney disease Father   . Cancer Father     throat  . Cancer Maternal Grandmother     breast   History  Substance Use Topics  .  Smoking status: Never Smoker   . Smokeless tobacco: Never Used  . Alcohol Use: No     Comment: l   OB History    No data available     Review of Systems  Constitutional: Negative for appetite change and fatigue.  HENT: Negative for congestion, ear discharge and sinus pressure.   Eyes: Negative for discharge.  Respiratory: Positive for cough and shortness of breath.   Cardiovascular: Negative for chest pain.  Gastrointestinal: Negative for abdominal pain and diarrhea.  Genitourinary: Negative for frequency and hematuria.  Musculoskeletal: Negative for back pain.  Skin: Negative for rash.  Neurological: Negative for seizures and headaches.  Psychiatric/Behavioral: Negative for hallucinations.      Allergies  Penicillins and Shellfish allergy  Home Medications   Prior to Admission medications   Medication Sig Start Date End Date Taking? Authorizing Provider  acetaminophen (TYLENOL) 500 MG tablet Take 500 mg by mouth every 6 (six) hours as needed for mild pain.   Yes Historical Provider, MD  acyclovir (ZOVIRAX) 800 MG tablet Take 800 mg by mouth Twice daily. 02/26/12  Yes Historical Provider, MD  albuterol (PROVENTIL HFA;VENTOLIN HFA) 108 (90 BASE) MCG/ACT inhaler Inhale 2 puffs into the lungs every 6 (six) hours as needed. Wheezing    Yes Historical Provider,  MD  [employer] FIBER GUMMIES PO Take 1 tablet by mouth daily.   Yes Historical Provider, MD  donepezil (ARICEPT) 5 MG tablet Take 5 mg by mouth daily.   Yes Historical Provider, MD  insulin detemir (LEVEMIR) 100 UNIT/ML injection Inject 15 Units into the skin at bedtime.   Yes Historical Provider, MD  Magnesium Cl-Calcium Carbonate (SLOW-MAG PO) Take 1 tablet by mouth daily.    Yes Historical Provider, MD  MELATONIN PO Take 1 tablet by mouth at bedtime.   Yes Historical Provider, MD  methylcellulose (ARTIFICIAL TEARS) 1 % ophthalmic solution Place 1 drop into both eyes daily as needed. dryness   Yes Historical Provider, MD   omeprazole (PRILOSEC) 40 MG capsule Take 40 mg by mouth daily.   Yes Historical Provider, MD  senna (SENOKOT) 8.6 MG TABS Take 2 tablets by mouth at bedtime. Constipation  09/14/11  Yes Shanker Kristeen Mans, MD  venlafaxine (EFFEXOR-XR) 75 MG 24 hr capsule Take 75 mg by mouth daily after breakfast.    Yes Historical Provider, MD  fluticasone (FLONASE) 50 MCG/ACT nasal spray Place 2 sprays into the nose daily. 12/28/11 12/27/12  Blair Heys, PA-C  loratadine (CLARITIN) 10 MG tablet Take 10 mg by mouth daily.    Historical Provider, MD   BP 106/68 mmHg  Pulse 126  Temp(Src) 98.5 F (36.9 C) (Oral)  Resp 32  Wt 122 lb (55.339 kg)  SpO2 95% Physical Exam  Constitutional: She is oriented to person, place, and time. She appears well-developed.  HENT:  Head: Normocephalic.  Eyes: Conjunctivae and EOM are normal. No scleral icterus.  Neck: Neck supple. No thyromegaly present.  Cardiovascular: Regular rhythm.  Exam reveals no gallop and no friction rub.   No murmur heard. Tachycardia  Pulmonary/Chest: No stridor. She has no wheezes. She has rales. She exhibits no tenderness.  Abdominal: She exhibits no distension. There is no tenderness. There is no rebound.  Musculoskeletal: Normal range of motion. She exhibits no edema.  Lymphadenopathy:    She has no cervical adenopathy.  Neurological: She is oriented to person, place, and time. She exhibits normal muscle tone. Coordination normal.  Skin: No rash noted. No erythema.  Psychiatric: She has a normal mood and affect. Her behavior is normal.    ED Course  Procedures (including critical care time) Labs Review Labs Reviewed  COMPREHENSIVE METABOLIC PANEL - Abnormal; Notable for the following:    CO2 21 (*)    Glucose, Bld 138 (*)    BUN 22 (*)    Calcium 8.8 (*)    Total Protein 5.2 (*)    Albumin 3.3 (*)    All other components within normal limits  CBC WITH DIFFERENTIAL/PLATELET - Abnormal; Notable for the following:    WBC 2.9 (*)     RBC 2.53 (*)    Hemoglobin 9.0 (*)    HCT 25.7 (*)    MCV 101.6 (*)    MCH 35.6 (*)    RDW 19.0 (*)    Platelets 55 (*)    All other components within normal limits  BRAIN NATRIURETIC PEPTIDE - Abnormal; Notable for the following:    B Natriuretic Peptide 2628.2 (*)    All other components within normal limits  D-DIMER, QUANTITATIVE (NOT AT [facility]) - Abnormal; Notable for the following:    D-Dimer, Quant 1.01 (*)    All other components within normal limits  I-STAT CG4 LACTIC ACID, ED - Abnormal; Notable for the following:    Lactic Acid, Venous  0.45 (*)    All other components within normal limits  I-STAT CHEM 8, ED - Abnormal; Notable for the following:    BUN 25 (*)    Glucose, Bld 137 (*)    Hemoglobin 9.2 (*)    HCT 27.0 (*)    All other components within normal limits  URINE CULTURE  CULTURE, BLOOD (ROUTINE X 2)  CULTURE, BLOOD (ROUTINE X 2)  URINALYSIS, ROUTINE W REFLEX MICROSCOPIC (NOT AT [facility])  BLOOD GAS, ARTERIAL  I-STAT CG4 LACTIC ACID, ED  I-STAT TROPOININ, ED  I-STAT CG4 LACTIC ACID, ED    Imaging Review Dg Chest 2 View  03/01/2015   CLINICAL DATA:  Cough since yesterday. Shortness of breath with exertion. History of hypertension, asthma.  EXAM: CHEST  2 VIEW  COMPARISON:  05/19/2012  FINDINGS: Right Port-A-Cath remains in place, unchanged. Postsurgical changes in the left breast and axilla. Heart is normal size. Bibasilar opacities likely reflect atelectasis. No effusions. No acute bony abnormality. Mild rightward scoliosis in the thoracolumbar spine.  IMPRESSION: Bibasilar atelectasis.   Electronically Signed   By: Rolm Baptise M.D.   On: 03/01/2015 14:59   Nm Pulmonary Perf And Vent  03/01/2015   CLINICAL DATA:  Shortness of Breath  EXAM: NUCLEAR MEDICINE VENTILATION - PERFUSION LUNG SCAN  TECHNIQUE: Ventilation images were obtained in multiple projections using inhaled aerosol Tc-81m DTPA. Perfusion images were obtained in multiple projections after intravenous  injection of Tc-16m MAA.  RADIOPHARMACEUTICALS:  Forty mCi Technetium-59m DTPA aerosol inhalation and 6 Technetium-70m MAA IV  COMPARISON:  Chest x-ray same day  FINDINGS: Ventilation: No focal ventilation defect.  Perfusion: No wedge shaped peripheral perfusion defects to suggest acute pulmonary embolism.  IMPRESSION: Low probability for pulmonary embolus.   Electronically Signed   By: Lahoma Crocker M.D.   On: 03/01/2015 22:18     EKG Interpretation   Date/Time:  Wednesday March 01 2015 16:32:05 EDT Ventricular Rate:  121 PR Interval:    QRS Duration: 88 QT Interval:  406 QTC Calculation: 576 R Axis:   77 Text Interpretation:  Junctional tachycardia Minimal ST depression,  anterolateral leads Prolonged QT interval Confirmed by Alucard Fearnow  MD, Dejuana Weist  [phone]) on 03/01/2015 10:31:54 PM     CRITICAL CARE Performed by: Mickie Badders L Total critical care time: 40 Critical care time was exclusive of separately billable procedures and treating other patients. Critical care was necessary to treat or prevent imminent or life-threatening deterioration. Critical care was time spent personally by me on the following activities: development of treatment plan with patient and/or surrogate as well as nursing, discussions with consultants, evaluation of patient's response to treatment, examination of patient, obtaining history from patient or surrogate, ordering and performing treatments and interventions, ordering and review of laboratory studies, ordering and review of radiographic studies, pulse oximetry and re-evaluation of patient's condition.  MDM   Final diagnoses:  SOB (shortness of breath)    Tachycardia,  Sob  admit   Milton Ferguson, MD 03/01/15 2318

## 2015-03-02 ENCOUNTER — Encounter (HOSPITAL_COMMUNITY): Payer: Self-pay | Admitting: General Practice

## 2015-03-02 ENCOUNTER — Ambulatory Visit (HOSPITAL_COMMUNITY): Payer: Medicare Other

## 2015-03-02 DIAGNOSIS — J219 Acute bronchiolitis, unspecified: Secondary | ICD-10-CM | POA: Diagnosis present

## 2015-03-02 DIAGNOSIS — E118 Type 2 diabetes mellitus with unspecified complications: Secondary | ICD-10-CM

## 2015-03-02 LAB — COMPREHENSIVE METABOLIC PANEL
ALT: 16 U/L (ref 14–54)
AST: 19 U/L (ref 15–41)
Albumin: 3.3 g/dL — ABNORMAL LOW (ref 3.5–5.0)
Alkaline Phosphatase: 71 U/L (ref 38–126)
Anion gap: 10 (ref 5–15)
BILIRUBIN TOTAL: 1 mg/dL (ref 0.3–1.2)
BUN: 17 mg/dL (ref 6–20)
CHLORIDE: 112 mmol/L — AB (ref 101–111)
CO2: 19 mmol/L — AB (ref 22–32)
CREATININE: 0.95 mg/dL (ref 0.44–1.00)
Calcium: 8.6 mg/dL — ABNORMAL LOW (ref 8.9–10.3)
GFR calc Af Amer: >60 mL/min (ref >60)
GLUCOSE: 325 mg/dL — AB (ref 65–99)
Potassium: 3.6 mmol/L (ref 3.5–5.1)
SODIUM: 141 mmol/L (ref 135–145)
Total Protein: 5.3 g/dL — ABNORMAL LOW (ref 6.5–8.1)

## 2015-03-02 LAB — CBC WITH DIFFERENTIAL/PLATELET
BASOS ABS: 0 10*3/uL (ref 0.0–0.1)
Basophils Relative: 0 % (ref 0–1)
Eosinophils Absolute: 0 10*3/uL (ref 0.0–0.7)
Eosinophils Relative: 0 % (ref 0–5)
HCT: 26.5 % — ABNORMAL LOW (ref 36.0–46.0)
HEMOGLOBIN: 9 g/dL — AB (ref 12.0–15.0)
Lymphocytes Relative: 6 % — ABNORMAL LOW (ref 12–46)
Lymphs Abs: 0.1 10*3/uL — ABNORMAL LOW (ref 0.7–4.0)
MCH: 35.3 pg — ABNORMAL HIGH (ref 26.0–34.0)
MCHC: 34 g/dL (ref 30.0–36.0)
MCV: 103.9 fL — AB (ref 78.0–100.0)
Monocytes Absolute: 0.1 10*3/uL (ref 0.1–1.0)
Monocytes Relative: 2 % — ABNORMAL LOW (ref 3–12)
Neutro Abs: 2.2 10*3/uL (ref 1.7–7.7)
Neutrophils Relative %: 91 % — ABNORMAL HIGH (ref 43–77)
PLATELETS: 41 10*3/uL — AB (ref 150–400)
RBC: 2.55 MIL/uL — ABNORMAL LOW (ref 3.87–5.11)
RDW: 19.4 % — ABNORMAL HIGH (ref 11.5–15.5)
WBC: 2.4 10*3/uL — AB (ref 4.0–10.5)

## 2015-03-02 LAB — GLUCOSE, CAPILLARY
GLUCOSE-CAPILLARY: 225 mg/dL — AB (ref 65–99)
GLUCOSE-CAPILLARY: 277 mg/dL — AB (ref 65–99)
GLUCOSE-CAPILLARY: 314 mg/dL — AB (ref 65–99)
Glucose-Capillary: 300 mg/dL — ABNORMAL HIGH (ref 65–99)
Glucose-Capillary: 216 mg/dL — ABNORMAL HIGH (ref 65–99)
Glucose-Capillary: 304 mg/dL — ABNORMAL HIGH (ref 65–99)

## 2015-03-02 LAB — TROPONIN I
Troponin I: 0.04 ng/mL — ABNORMAL HIGH (ref <0.031)
Troponin I: 0.04 ng/mL — ABNORMAL HIGH (ref <0.031)

## 2015-03-02 LAB — LACTATE DEHYDROGENASE: LDH: 207 U/L — ABNORMAL HIGH (ref 98–192)

## 2015-03-02 LAB — PROTIME-INR
INR: 1.18 (ref 0.00–1.49)
PROTHROMBIN TIME: 15.2 s (ref 11.6–15.2)

## 2015-03-02 LAB — STREP PNEUMONIAE URINARY ANTIGEN: Strep Pneumo Urinary Antigen: NEGATIVE

## 2015-03-02 LAB — TSH: TSH: 3.371 u[IU]/mL (ref 0.350–4.500)

## 2015-03-02 MED ORDER — ZOLPIDEM TARTRATE 5 MG PO TABS
5.0000 mg | ORAL_TABLET | Freq: Every evening | ORAL | Status: DC | PRN
  Administered 2015-03-02: 5 mg via ORAL
  Filled 2015-03-02: qty 1

## 2015-03-02 MED ORDER — ACYCLOVIR 800 MG PO TABS
800.0000 mg | ORAL_TABLET | Freq: Two times a day (BID) | ORAL | Status: DC
  Administered 2015-03-02 – 2015-03-11 (×19): 800 mg via ORAL
  Filled 2015-03-02 (×21): qty 1

## 2015-03-02 MED ORDER — LEVALBUTEROL HCL 0.63 MG/3ML IN NEBU
0.6300 mg | INHALATION_SOLUTION | Freq: Four times a day (QID) | RESPIRATORY_TRACT | Status: DC
  Administered 2015-03-02 (×3): 0.63 mg via RESPIRATORY_TRACT
  Filled 2015-03-02 (×7): qty 3

## 2015-03-02 MED ORDER — ENSURE ENLIVE PO LIQD
237.0000 mL | Freq: Two times a day (BID) | ORAL | Status: DC
  Administered 2015-03-02 – 2015-03-08 (×8): 237 mL via ORAL

## 2015-03-02 MED ORDER — INSULIN DETEMIR 100 UNIT/ML ~~LOC~~ SOLN
15.0000 [IU] | Freq: Every day | SUBCUTANEOUS | Status: DC
  Administered 2015-03-02 – 2015-03-04 (×3): 15 [IU] via SUBCUTANEOUS
  Filled 2015-03-02 (×6): qty 0.15

## 2015-03-02 MED ORDER — VANCOMYCIN HCL IN DEXTROSE 750-5 MG/150ML-% IV SOLN
750.0000 mg | Freq: Two times a day (BID) | INTRAVENOUS | Status: DC
  Administered 2015-03-02: 750 mg via INTRAVENOUS
  Filled 2015-03-02 (×2): qty 150

## 2015-03-02 MED ORDER — INSULIN ASPART 100 UNIT/ML ~~LOC~~ SOLN
0.0000 [IU] | Freq: Every day | SUBCUTANEOUS | Status: DC
  Administered 2015-03-02 (×2): 4 [IU] via SUBCUTANEOUS

## 2015-03-02 MED ORDER — IPRATROPIUM BROMIDE 0.02 % IN SOLN
0.5000 mg | Freq: Four times a day (QID) | RESPIRATORY_TRACT | Status: DC
  Administered 2015-03-02 (×4): 0.5 mg via RESPIRATORY_TRACT
  Filled 2015-03-02 (×4): qty 2.5

## 2015-03-02 MED ORDER — LEVALBUTEROL HCL 0.63 MG/3ML IN NEBU
0.6300 mg | INHALATION_SOLUTION | Freq: Four times a day (QID) | RESPIRATORY_TRACT | Status: DC | PRN
  Administered 2015-03-10: 0.63 mg via RESPIRATORY_TRACT
  Filled 2015-03-02: qty 3

## 2015-03-02 MED ORDER — FUROSEMIDE 10 MG/ML IJ SOLN
40.0000 mg | Freq: Every day | INTRAMUSCULAR | Status: DC
  Administered 2015-03-02: 40 mg via INTRAVENOUS
  Filled 2015-03-02 (×2): qty 4

## 2015-03-02 MED ORDER — PREDNISONE 20 MG PO TABS
40.0000 mg | ORAL_TABLET | Freq: Every day | ORAL | Status: DC
  Filled 2015-03-02 (×3): qty 2

## 2015-03-02 MED ORDER — PANTOPRAZOLE SODIUM 40 MG PO TBEC
80.0000 mg | DELAYED_RELEASE_TABLET | Freq: Every day | ORAL | Status: DC
  Administered 2015-03-02 – 2015-03-11 (×10): 80 mg via ORAL
  Filled 2015-03-02 (×11): qty 2

## 2015-03-02 MED ORDER — DONEPEZIL HCL 5 MG PO TABS
5.0000 mg | ORAL_TABLET | Freq: Every day | ORAL | Status: DC
  Administered 2015-03-02 – 2015-03-11 (×10): 5 mg via ORAL
  Filled 2015-03-02 (×10): qty 1

## 2015-03-02 MED ORDER — PREDNISONE 50 MG PO TABS
50.0000 mg | ORAL_TABLET | Freq: Every day | ORAL | Status: DC
  Administered 2015-03-02: 50 mg via ORAL
  Filled 2015-03-02 (×2): qty 1

## 2015-03-02 MED ORDER — IPRATROPIUM BROMIDE 0.02 % IN SOLN
0.5000 mg | Freq: Three times a day (TID) | RESPIRATORY_TRACT | Status: DC
  Administered 2015-03-03 – 2015-03-07 (×12): 0.5 mg via RESPIRATORY_TRACT
  Filled 2015-03-02 (×13): qty 2.5

## 2015-03-02 MED ORDER — INSULIN ASPART 100 UNIT/ML ~~LOC~~ SOLN
0.0000 [IU] | Freq: Three times a day (TID) | SUBCUTANEOUS | Status: DC
  Administered 2015-03-02: 8 [IU] via SUBCUTANEOUS
  Administered 2015-03-02: 5 [IU] via SUBCUTANEOUS
  Administered 2015-03-02: 8 [IU] via SUBCUTANEOUS
  Administered 2015-03-03: 2 [IU] via SUBCUTANEOUS
  Administered 2015-03-03 – 2015-03-05 (×4): 3 [IU] via SUBCUTANEOUS
  Administered 2015-03-06: 2 [IU] via SUBCUTANEOUS
  Administered 2015-03-06 – 2015-03-07 (×2): 5 [IU] via SUBCUTANEOUS
  Administered 2015-03-08: 3 [IU] via SUBCUTANEOUS
  Administered 2015-03-08: 8 [IU] via SUBCUTANEOUS
  Administered 2015-03-09: 2 [IU] via SUBCUTANEOUS
  Administered 2015-03-09: 5 [IU] via SUBCUTANEOUS
  Administered 2015-03-09 – 2015-03-10 (×3): 2 [IU] via SUBCUTANEOUS
  Administered 2015-03-10 – 2015-03-11 (×2): 5 [IU] via SUBCUTANEOUS

## 2015-03-02 MED ORDER — GUAIFENESIN ER 600 MG PO TB12
600.0000 mg | ORAL_TABLET | Freq: Two times a day (BID) | ORAL | Status: DC
  Administered 2015-03-02 – 2015-03-04 (×6): 600 mg via ORAL
  Filled 2015-03-02 (×7): qty 1

## 2015-03-02 MED ORDER — LEVOFLOXACIN IN D5W 750 MG/150ML IV SOLN
750.0000 mg | INTRAVENOUS | Status: DC
  Administered 2015-03-02 – 2015-03-03 (×2): 750 mg via INTRAVENOUS
  Filled 2015-03-02 (×2): qty 150

## 2015-03-02 MED ORDER — VENLAFAXINE HCL ER 75 MG PO CP24
75.0000 mg | ORAL_CAPSULE | Freq: Every day | ORAL | Status: DC
  Administered 2015-03-02 – 2015-03-11 (×10): 75 mg via ORAL
  Filled 2015-03-02 (×13): qty 1

## 2015-03-02 MED ORDER — INSULIN DETEMIR 100 UNIT/ML ~~LOC~~ SOLN: 15.0000 [IU] | Freq: Every day | SUBCUTANEOUS | Status: DC

## 2015-03-02 MED ORDER — ZOLPIDEM TARTRATE 5 MG PO TABS
5.0000 mg | ORAL_TABLET | Freq: Once | ORAL | Status: AC
  Administered 2015-03-02: 5 mg via ORAL
  Filled 2015-03-02: qty 1

## 2015-03-02 MED ORDER — VANCOMYCIN HCL 500 MG IV SOLR
500.0000 mg | Freq: Two times a day (BID) | INTRAVENOUS | Status: DC
  Administered 2015-03-02: 500 mg via INTRAVENOUS
  Filled 2015-03-02 (×3): qty 500

## 2015-03-02 NOTE — Progress Notes (Signed)
NP notified to get something for patient to sleep tonight.

## 2015-03-02 NOTE — Progress Notes (Signed)
Rn notified NP concerning patient's pulse. No further orders at this time.

## 2015-03-02 NOTE — H&P (Signed)
[facility] History and Physical  Patient: Victoria Wood  MRN: [id number]  DOB: Sep 28, 1950  DOS: the patient was seen and examined on 03/02/2015 PCP: Vicenta Aly, FNP  Referring physician: Dr. Roderic Palau Chief Complaint: Shortness of breath and cough  HPI: Victoria Wood is a 64 y.o. female with Past medical history of non-Hodgkin's lymphoma with ongoing chemotherapy last chemotherapy 6 weeks ago at [facility], pancytopenia, hypertension, GERD, diabetes mellitus type 2, autologous bone marrow transplant history. The patient is presenting with complaints of cough and shortness of breath on ongoing since last one week progressively worsening. She has them expectoration but unsure of the color. She denies any blood. She denies any fever but complains of some chills and fatigue. Denies any chest pain or abdominal pain. Denies any vomiting but does complain of nausea. No diarrhea or constipation no burning urination. No recent leg swelling or leg cramps. She does not know what chemotherapy that she is currently receiving. But this appears to be her fourth course of chemotherapy as she has been diagnosed with non-Hodgkin's lymphoma when she was in 70s. No recent echocardiogram but she mentions she has had echo in the past. No cardiac history or invasive cardiac workup. She has never smoked and never has recurrent asthma or COPD.  The patient is coming from home. And at her baseline independent for most of her ADL.  Review of Systems: as mentioned in the history of present illness.  A comprehensive review of the other systems is negative.  Past Medical History  Diagnosis Date  . Gastric polyps     History of  . GERD (gastroesophageal reflux disease)   . HTN (hypertension)   . Asthma   . Non Hodgkin's lymphoma   . Anemia   . Hyperlipidemia   . Complication of anesthesia     has woken up during surgery   . Pneumonia     hx of pneumonia   . Blood transfusion   .  Cancer     hx of Rituxan in 07/2011  . Non-Hodgkin's lymphoma   . DM type 2 (diabetes mellitus, type 2)   . 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 Hypomagnesemia 01/13/2012   Past Surgical History  Procedure Laterality Date  . Portacath placement    . Upper gastrointestinal endoscopy  02/2011    gastric polyps x 2  . Neck and thigh surgery      lymph nodes removed  . Esophagogastroduodenoscopy  08/22/2011    Procedure: ESOPHAGOGASTRODUODENOSCOPY (EGD);  Surgeon: Inda Castle, MD;  Location: [facility];  Service: Endoscopy;  Laterality: N/A;  . Lymph node biopsy  10/02/11    left axillary   Social History:  reports that she has never smoked. She has never used smokeless tobacco. She reports that she does not drink alcohol or use illicit drugs.  Allergies  Allergen Reactions  . Penicillins Diarrhea  . Shellfish Allergy     migraines    Family History  Problem Relation Age of Onset  . Hypertension Mother   . Cancer Mother     breast  . Hypertension Father   . Kidney disease Father   . Cancer Father     throat  . Cancer Maternal Grandmother     breast    Prior to Admission medications   Medication Sig Start Date End Date Taking? Authorizing Provider  acetaminophen (TYLENOL) 500 MG tablet Take 500 mg by mouth every 6 (six) hours as needed for mild pain.  Yes Historical Provider, MD  acyclovir (ZOVIRAX) 800 MG tablet Take 800 mg by mouth Twice daily. 02/26/12  Yes Historical Provider, MD  albuterol (PROVENTIL HFA;VENTOLIN HFA) 108 (90 BASE) MCG/ACT inhaler Inhale 2 puffs into the lungs every 6 (six) hours as needed. Wheezing    Yes Historical Provider, MD  [employer] FIBER GUMMIES PO Take 1 tablet by mouth daily.   Yes Historical Provider, MD  donepezil (ARICEPT) 5 MG tablet Take 5 mg by mouth daily.   Yes Historical Provider, MD  insulin detemir (LEVEMIR) 100 UNIT/ML injection Inject 15 Units into the skin at bedtime.   Yes Historical Provider, MD  Magnesium  Cl-Calcium Carbonate (SLOW-MAG PO) Take 1 tablet by mouth daily.    Yes Historical Provider, MD  MELATONIN PO Take 1 tablet by mouth at bedtime.   Yes Historical Provider, MD  methylcellulose (ARTIFICIAL TEARS) 1 % ophthalmic solution Place 1 drop into both eyes daily as needed. dryness   Yes Historical Provider, MD  omeprazole (PRILOSEC) 40 MG capsule Take 40 mg by mouth daily.   Yes Historical Provider, MD  senna (SENOKOT) 8.6 MG TABS Take 2 tablets by mouth at bedtime. Constipation  09/14/11  Yes Shanker Kristeen Mans, MD  venlafaxine (EFFEXOR-XR) 75 MG 24 hr capsule Take 75 mg by mouth daily with breakfast.    Yes Historical Provider, MD  fluticasone (FLONASE) 50 MCG/ACT nasal spray Place 2 sprays into the nose daily. 12/28/11 12/27/12  Blair Heys, PA-C  loratadine (CLARITIN) 10 MG tablet Take 10 mg by mouth daily.    Historical Provider, MD    Physical Exam: Filed Vitals:   03/02/15 0011 03/02/15 0012 03/02/15 0230 03/02/15 0512  BP: 105/64   108/58  Pulse: 129   129  Temp: 98.4 F (36.9 C)   98.3 F (36.8 C)  TempSrc: Oral   Oral  Resp: 18   18  Height:  5\' 7"  (1.702 m)    Weight:  68.9 kg (151 lb 14.4 oz)  68.7 kg (151 lb 7.3 oz)  SpO2: 97%  100% 97%    General: Alert, Awake and Oriented to Time, Place and Person. Appear in moderate distress Eyes: PERRL ENT: Oral Mucosa clear moist. Neck: no JVD Cardiovascular: S1 and S2 Present, aortic systolic  Murmur, Peripheral Pulses Present Respiratory: Bilateral Air entry equal and Decreased,  Basal  Crackles, bilateral expiratory  wheezes Abdomen: Bowel Sound present, Soft and no tender Skin: no Rash Extremities: no Pedal edema, no calf tenderness Neurologic: Grossly no focal neuro deficit.  Labs on Admission:  CBC:  Recent Labs Lab 03/01/15 1610 03/01/15 1632  WBC 2.9*  --   NEUTROABS 1.7  --   HGB 9.0* 9.2*  HCT 25.7* 27.0*  MCV 101.6*  --   PLT 55*  --     CMP     Component Value Date/Time   NA 139 03/01/2015 1632    K 3.7 03/01/2015 1632   CL 106 03/01/2015 1632   CO2 21* 03/01/2015 1610   GLUCOSE 137* 03/01/2015 1632   BUN 25* 03/01/2015 1632   CREATININE 0.80 03/01/2015 1632   CALCIUM 8.8* 03/01/2015 1610   PROT 5.2* 03/01/2015 1610   ALBUMIN 3.3* 03/01/2015 1610   AST 19 03/01/2015 1610   ALT 16 03/01/2015 1610   ALKPHOS 77 03/01/2015 1610   BILITOT 0.7 03/01/2015 1610   GFRNONAA >60 03/01/2015 1610   GFRAA >60 03/01/2015 1610    No results for input(s): LIPASE, AMYLASE in the last 168 hours.  No results for input(s): CKTOTAL, CKMB, CKMBINDEX, TROPONINI in the last 168 hours. BNP (last 3 results)  Recent Labs  03/01/15 1610  BNP 2628.2*    ProBNP (last 3 results) No results for input(s): PROBNP in the last 8760 hours.   Radiological Exams on Admission: Dg Chest 2 View  03/01/2015   CLINICAL DATA:  Cough since yesterday. Shortness of breath with exertion. History of hypertension, asthma.  EXAM: CHEST  2 VIEW  COMPARISON:  05/19/2012  FINDINGS: Right Port-A-Cath remains in place, unchanged. Postsurgical changes in the left breast and axilla. Heart is normal size. Bibasilar opacities likely reflect atelectasis. No effusions. No acute bony abnormality. Mild rightward scoliosis in the thoracolumbar spine.  IMPRESSION: Bibasilar atelectasis.   Electronically Signed   By: Rolm Baptise M.D.   On: 03/01/2015 14:59   Nm Pulmonary Perf And Vent  03/01/2015   CLINICAL DATA:  Shortness of Breath  EXAM: NUCLEAR MEDICINE VENTILATION - PERFUSION LUNG SCAN  TECHNIQUE: Ventilation images were obtained in multiple projections using inhaled aerosol Tc-34m DTPA. Perfusion images were obtained in multiple projections after intravenous injection of Tc-85m MAA.  RADIOPHARMACEUTICALS:  Forty mCi Technetium-12m DTPA aerosol inhalation and 6 Technetium-28m MAA IV  COMPARISON:  Chest x-ray same day  FINDINGS: Ventilation: No focal ventilation defect.  Perfusion: No wedge shaped peripheral perfusion defects to  suggest acute pulmonary embolism.  IMPRESSION: Low probability for pulmonary embolus.   Electronically Signed   By: Lahoma Crocker M.D.   On: 03/01/2015 22:18   Dg Chest Port 1 View  03/02/2015   CLINICAL DATA:  Acute onset of shortness of breath. Initial encounter.  EXAM: PORTABLE CHEST - 1 VIEW  COMPARISON:  Chest radiograph performed earlier today at 2:41 p.m.  FINDINGS: The lungs are mildly hypoexpanded. Vascular congestion is noted. Mildly increased interstitial markings may reflect minimal interstitial edema. There is no evidence of pleural effusion or pneumothorax.  The cardiomediastinal silhouette is borderline normal in size. A right-sided chest port is noted ending about the mid SVC. Clips are seen overlying the left axilla. No acute osseous abnormalities are seen.  IMPRESSION: Lungs mildly hypoexpanded. Vascular congestion noted. Mildly increased interstitial markings may reflect minimal interstitial edema.   Electronically Signed   By: Garald Balding M.D.   On: 03/02/2015 00:04   EKG: Independently reviewed. nonspecific ST and T waves changes, sinus tachycardia.  Assessment/Plan Principal Problem:   Acute bronchiolitis Active Problems:   Non-Hodgkin's lymphoma   GERD (gastroesophageal reflux disease)   HTN (hypertension)   DM type 2 (diabetes mellitus, type 2)   Autologous bone marrow transplantation status   Pancytopenia   1. Acute bronchiolitis The patient is presenting with numbness of cough and shortness of breath. She has expiratory wheezing as well as basal crackles. Chest x-ray shows possible congestion versus possible basal atelectasis. She has some leukocytosis as well as tachycardia with respiratory distress. Thus at present she may have probable acute bronchiolitis. I would treat her with oral prednisone, DuoNeb's, broad-spectrum antibiotics secondary to her chemotherapy status vancomycin and Levaquin, and we will follow other cultures.  2.Possible acute CHF. She  presents with tachycardia shortness of breath. BNP is significantly elevated. She does not appear volume overloaded clinically. I will obtain an echocardiogram in the morning. She has received some IV fluids in the ER I would hold off on giving her further IV fluids and monitor her on telemetry. Serial troponins. She has had history of chemotherapy for Hodgkin's lymphoma and therefore I suspect she is  at high risk for developing chemotherapy-induced cardiomyopathy.  3.Pancytopenia. In relation to chemotherapy. Continue close monitoring.  4.Sinus tachycardia with shortness of breath. A VQ scan is a low probability for pulmonary embolism. We will continue to closely monitor.  5.Diabetes mellitus. Continuing Levemir and placing her on sliding scale.  Advance goals of care discussion: Full code   DVT Prophylaxis: mechanical compression device  Nutrition: Cardiac and diabetic diet  Family Communication: family was present at bedside, opportunity was given to ask question and all questions were answered satisfactorily at the time of interview. Disposition: Admitted as inpatient, telemetry unit.  Author: Berle Mull, MD [facility] Pager: [phone] 03/02/2015  If 7PM-7AM, please contact night-coverage [email or web address] Password [email or web address]

## 2015-03-02 NOTE — Progress Notes (Signed)
Inpatient Diabetes Program Recommendations  AACE/ADA: New Consensus Statement on Inpatient Glycemic Control (2013)  Target Ranges:  Prepandial:   less than 140 mg/dL      Peak postprandial:   less than 180 mg/dL (1-2 hours)      Critically ill patients:  140 - 180 mg/dL  Results for NYKIA, TURKO (MRN [id number]) as of 03/02/2015 09:18  Ref. Range 03/02/2015 00:15 03/02/2015 02:47 03/02/2015 08:09  Glucose-Capillary Latest Ref Range: 65-99 mg/dL 225 (H) 314 (H) 277 (H)   Reason for Admission: Acute Bronchiolitis  Diabetes history: DM 2 Outpatient Diabetes medications: Levemir 15 units QHS Current orders for Inpatient glycemic control: Levemir QHS, Novolog Moderate (0-15 units)TID, Novolog 0-5 units QHS  Inpatient Diabetes Program Recommendations Insulin - Basal: Please adjust times of basal insulin to be given this am. Glucose is almost in the 300's. Pharmacy will be able to change.  Thanks,  Tama Headings RN, MSN, [facility] Inpatient Diabetes Coordinator Team Pager [phone] (8AM-5PM)

## 2015-03-02 NOTE — Progress Notes (Signed)
Patient lives in [place], staying with daughter for past week. Patient chose to use a national company such as [employer] or [employer] if she requires oxygen at discharge so that she can continue to use it if she goes back to [place]. Will reassess need for oxygen closer to discharge.

## 2015-03-02 NOTE — Progress Notes (Addendum)
ANTIBIOTIC CONSULT NOTE - INITIAL  Pharmacy Consult for Vancomycin/Levaquin  Indication: rule out pneumonia  Allergies  Allergen Reactions  . Penicillins Diarrhea  . Shellfish Allergy     migraines    Patient Measurements: Height: 5\' 7"  (170.2 cm) Weight: 151 lb 14.4 oz (68.9 kg) IBW/kg (Calculated) : 61.6  Vital Signs: Temp: 98.4 F (36.9 C) (06/23 0011) Temp Source: Oral (06/23 0011) BP: 105/64 mmHg (06/23 0011) Pulse Rate: 129 (06/23 0011)  Labs:  Recent Labs  03/01/15 1610 03/01/15 1632  WBC 2.9*  --   HGB 9.0* 9.2*  PLT 55*  --   CREATININE 0.92 0.80   Estimated Creatinine Clearance: 67.3 mL/min (by C-G formula based on Cr of 0.8).  Medical History: Past Medical History  Diagnosis Date  . Gastric polyps     History of  . GERD (gastroesophageal reflux disease)   . HTN (hypertension)   . Asthma   . Non Hodgkin's lymphoma   . Anemia   . Hyperlipidemia   . Complication of anesthesia     has woken up during surgery   . Pneumonia     hx of pneumonia   . Blood transfusion   . Cancer     hx of Rituxan in 07/2011  . Non-Hodgkin's lymphoma   . DM type 2 (diabetes mellitus, type 2)   . 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 Hypomagnesemia 01/13/2012    Assessment: 64 y/o F here with cough, WBC low (h/o cancer), afebrile, some tachycardia/hypotension, other labs as above.   Goal of Therapy:  Vancomycin trough level 15-20 mcg/ml  Plan:  -Vancomycin 750 mg IV q12h -Levaquin 750 mg IV q24h -Trend WBC, temp, renal function  -Drug levels as indicated   Narda Bonds 03/02/2015,2:20 AM

## 2015-03-02 NOTE — Progress Notes (Signed)
Patient admitted after midnight. Chart reviewed. Pt examined. BNP >2000. Await echo. Start lasix. PT eval. Monitor CBCs.  Doree Barthel, MD [facility]

## 2015-03-02 NOTE — Evaluation (Signed)
Physical Therapy Evaluation Patient Details Name: Victoria Wood MRN: [id number] DOB: 01-14-1951 Today's Date: 03/02/2015   History of Present Illness  64 y.o. female admitted with Acute bronchiolitis, possible acute CHF, and pancytopenia.  Clinical Impression  Pt admitted with the above complications. Pt currently with functional limitations due to the deficits listed below (see PT Problem List). Ambulates generally well today without loss of balance. Cues for directions, as she is somewhat distracted at times but demonstrates good control of her walker. Victoria Wood will have 24 hour supervision at d/c as needed. HR was 137 at rest which RN reports has been consistent all day, elevated to 142 while ambulating with SpO2 of 99% on room air. No dyspnea noted. Pt will benefit from skilled PT to increase their independence and safety with mobility to allow discharge to the venue listed below.       Follow Up Recommendations Home health PT;Supervision/Assistance - 24 hour    Equipment Recommendations  None recommended by PT    Recommendations for Other Services       Precautions / Restrictions Precautions Precautions: Fall Precaution Comments: monitor HR Restrictions Weight Bearing Restrictions: No      Mobility  Bed Mobility Overal bed mobility: Modified Independent                Transfers Overall transfer level: Needs assistance Equipment used: None Transfers: Sit to/from Stand Sit to Stand: Min guard         General transfer comment: min guard for safety. Minor sway noted upon rising but able to self correct. VC for hand placement. Performed from lowest bed setting  Ambulation/Gait Ambulation/Gait assistance: Min guard Ambulation Distance (Feet): 90 Feet Assistive device: 4-wheeled walker Gait Pattern/deviations: Decreased step length - right;Decreased stride length     General Gait Details: Min guard for safety. No loss of balance noted HR to 142 (137 at rest)  SpO2 99% on room air with no dyspnea on exertion noted. Cues for directions, somewhat distracted at times.  Stairs            Wheelchair Mobility    Modified Rankin (Stroke Patients Only)       Balance Overall balance assessment: Needs assistance;History of Falls Sitting-balance support: No upper extremity supported;Feet supported Sitting balance-Leahy Scale: Good     Standing balance support: No upper extremity supported Standing balance-Leahy Scale: Fair                               Pertinent Vitals/Pain Pain Assessment: No/denies pain    Home Living Family/patient expects to be discharged to:: Private residence Living Arrangements: Spouse/significant other;Children Available Help at Discharge: Family;Available 24 hours/day Type of Home: House Home Access: Stairs to enter Entrance Stairs-Rails: None Entrance Stairs-Number of Steps: 3 Home Layout: Two level Home Equipment: Walker - 4 wheels Additional Comments: Staying with daughter for the summer. Will return to home in [place] at end of summer and live with husband. Has a shower seat at home.    Prior Function Level of Independence: Needs assistance   Gait / Transfers Assistance Needed: rollator  ADL's / Homemaking Assistance Needed: Needs assist to get into shower        Hand Dominance   Dominant Hand: Right    Extremity/Trunk Assessment   Upper Extremity Assessment: Defer to OT evaluation           Lower Extremity Assessment: Overall WFL for tasks assessed  Communication   Communication: No difficulties  Cognition Arousal/Alertness: Awake/alert Behavior During Therapy: WFL for tasks assessed/performed Overall Cognitive Status: Impaired/Different from baseline Area of Impairment: Orientation;Problem solving Orientation Level: Time (unaware of month or year)   Memory: Decreased short-term memory       Problem Solving: Difficulty sequencing;Requires verbal cues       General Comments General comments (skin integrity, edema, etc.): HR 137 at rest 142 ambulating . SpO2 99% on room air.    Exercises        Assessment/Plan    PT Assessment Patient needs continued PT services  PT Diagnosis Difficulty walking;Other (comment) (Had a fall, and has been dyspneic)   PT Problem List Decreased activity tolerance;Decreased balance;Decreased mobility;Decreased cognition;Cardiopulmonary status limiting activity  PT Treatment Interventions DME instruction;Gait training;Stair training;Functional mobility training;Therapeutic activities;Therapeutic exercise;Balance training;Patient/family education   PT Goals (Current goals can be found in the Care Plan section) Acute Rehab PT Goals Patient Stated Goal: none stated PT Goal Formulation: With patient/family Time For Goal Achievement: 03/16/15 Potential to Achieve Goals: Good    Frequency Min 3X/week   Barriers to discharge        Co-evaluation               End of Session Equipment Utilized During Treatment: Gait belt Activity Tolerance: Patient tolerated treatment well Patient left: in bed;with call bell/phone within reach;with family/visitor present Nurse Communication: Mobility status         Time: 3428-7681 PT Time Calculation (min) (ACUTE ONLY): 19 min   Charges:   PT Evaluation $Initial PT Evaluation Tier I: 1 Procedure     PT G CodesEllouise Wood 03/02/2015, 4:05 PM Victoria Wood, [place]

## 2015-03-02 NOTE — Progress Notes (Signed)
Echocardiogram 2D Echocardiogram has been performed.  Joelene Millin 03/02/2015, 4:36 PM

## 2015-03-02 NOTE — Progress Notes (Signed)
Utilization Review completed. Darin Redmann RN BSN CM 

## 2015-03-02 NOTE — Progress Notes (Addendum)
Was the fall witnessed: Victoria Wood Patient condition before and after the fall: Forgetful  Patient's reaction to the fall: Sore in knees and front of forehead  Name of the doctor that was notified including date and time: Conley Canal 6/23 1055  Any interventions and vital signs: Neuro Checks every 4 hours x2

## 2015-03-02 NOTE — Progress Notes (Signed)
Patient admitted to unit via stretcher. Alert and oriented x 3, per daughter Tammy patient is forgetful at times, especially at night. Pt lives with husband in [place], has home health. Skin intact. Port a cath intact, IV team notified of patient's arrival. Bed locked in lowest position, call bell within reach. Bed alarm in place. Patient placed on tele box #4. Pt without complaints and in no distress at this time. [facility] admissions paged. Dr. Posey Pronto to see patient. Will continue to monitor.

## 2015-03-02 NOTE — Progress Notes (Signed)
Initial Nutrition Assessment  DOCUMENTATION CODES:  Not applicable  INTERVENTION:  Ensure Enlive (each supplement provides 350kcal and 20 grams of protein)  NUTRITION DIAGNOSIS:  Inadequate oral intake related to  (decreased appetite) as evidenced by meal completion < 25%.  GOAL:  Patient will meet greater than or equal to 90% of their needs  MONITOR:  PO intake, Supplement acceptance, Skin, Weight trends, Labs, I & O's  REASON FOR ASSESSMENT:  Malnutrition Screening Tool    ASSESSMENT: Victoria Wood is a 64 y.o. female with Past medical history of non-Hodgkin's lymphoma with ongoing chemotherapy last chemotherapy 6 weeks ago at [facility], pancytopenia, hypertension, GERD, diabetes mellitus type 2, autologous bone marrow transplant history.The patient is presenting with complaints of cough and shortness of breath on ongoing since last one week progressively worsening.  Pt admitted with acute bronchiolitis.  Attempted to examine pt x 3, however, pt was being assisted with bathing at times of visits. Unable to perform nutrition-focused physical exam at this time.   Wt hx reveals UBW of 151#. Wt trends reveal that pt has had a hx of weight loss, however, pt has regained back to usual weight.   Intake has been poor; per doc flowsheets meal completion 15%. Pt is accepting Ensure supplements. RD will continue to maximize nutritional intake.   Staff report that pt fell earlier this AM.   Height:  Ht Readings from Last 1 Encounters:  03/02/15 5\' 7"  (1.702 m)    Weight:  Wt Readings from Last 1 Encounters:  03/02/15 151 lb 7.3 oz (68.7 kg)    Ideal Body Weight:  61.4 kg  Wt Readings from Last 10 Encounters:  03/02/15 151 lb 7.3 oz (68.7 kg)  05/21/12 124 lb 8 oz (56.473 kg)  03/11/12 127 lb 12.8 oz (57.97 kg)  12/31/11 137 lb 6.4 oz (62.324 kg)  12/03/11 142 lb 4.8 oz (64.547 kg)  11/25/11 140 lb 8 oz (63.73 kg)  11/15/11 136 lb 6.4 oz (61.871 kg)   11/06/11 143 lb 6.4 oz (65.046 kg)  10/25/11 140 lb (63.504 kg)  10/22/11 141 lb 9.6 oz (64.229 kg)    BMI:  Body mass index is 23.72 kg/(m^2).  Estimated Nutritional Needs:  Kcal:  1650-1850  Protein:  80-90 grams  Fluid:  1.6-1.8 L  Skin:  Reviewed, no issues  Diet Order:  Diet heart healthy/carb modified Room service appropriate?: Yes; Fluid consistency:: Thin  EDUCATION NEEDS:  No education needs identified at this time   Intake/Output Summary (Last 24 hours) at 03/02/15 1614 Last data filed at 03/02/15 1545  Gross per 24 hour  Intake    510 ml  Output   2200 ml  Net  -1690 ml    Last BM:  03/02/15  Jermine Bibbee A. Jimmye Norman, RD, LDN, CDE Pager: [phone] After hours Pager: [phone]

## 2015-03-03 DIAGNOSIS — I5021 Acute systolic (congestive) heart failure: Secondary | ICD-10-CM | POA: Diagnosis present

## 2015-03-03 DIAGNOSIS — E119 Type 2 diabetes mellitus without complications: Secondary | ICD-10-CM

## 2015-03-03 DIAGNOSIS — R000 Tachycardia, unspecified: Secondary | ICD-10-CM | POA: Diagnosis present

## 2015-03-03 DIAGNOSIS — F0390 Unspecified dementia without behavioral disturbance: Secondary | ICD-10-CM | POA: Diagnosis present

## 2015-03-03 DIAGNOSIS — I420 Dilated cardiomyopathy: Secondary | ICD-10-CM

## 2015-03-03 LAB — GLUCOSE, CAPILLARY
GLUCOSE-CAPILLARY: 140 mg/dL — AB (ref 65–99)
GLUCOSE-CAPILLARY: 115 mg/dL — AB (ref 65–99)
Glucose-Capillary: 96 mg/dL (ref 65–99)
Glucose-Capillary: 158 mg/dL — ABNORMAL HIGH (ref 65–99)

## 2015-03-03 LAB — CBC WITH DIFFERENTIAL/PLATELET
BASOS ABS: 0 10*3/uL (ref 0.0–0.1)
BASOS PCT: 0 % (ref 0–1)
EOS ABS: 0 10*3/uL (ref 0.0–0.7)
EOS PCT: 0 % (ref 0–5)
HCT: 25.7 % — ABNORMAL LOW (ref 36.0–46.0)
Hemoglobin: 8.9 g/dL — ABNORMAL LOW (ref 12.0–15.0)
LYMPHS PCT: 9 % — AB (ref 12–46)
Lymphs Abs: 0.4 10*3/uL — ABNORMAL LOW (ref 0.7–4.0)
MCH: 35.5 pg — ABNORMAL HIGH (ref 26.0–34.0)
MCHC: 34.6 g/dL (ref 30.0–36.0)
MCV: 102.4 fL — AB (ref 78.0–100.0)
Monocytes Absolute: 0.9 10*3/uL (ref 0.1–1.0)
Monocytes Relative: 16 % — ABNORMAL HIGH (ref 3–12)
Neutro Abs: 3.9 10*3/uL (ref 1.7–7.7)
Neutrophils Relative %: 75 % (ref 43–77)
PLATELETS: 48 10*3/uL — AB (ref 150–400)
RBC: 2.51 MIL/uL — ABNORMAL LOW (ref 3.87–5.11)
RDW: 19.6 % — AB (ref 11.5–15.5)
WBC: 5.2 10*3/uL (ref 4.0–10.5)

## 2015-03-03 LAB — BASIC METABOLIC PANEL
ANION GAP: 8 (ref 5–15)
BUN: 22 mg/dL — ABNORMAL HIGH (ref 6–20)
CHLORIDE: 108 mmol/L (ref 101–111)
CO2: 24 mmol/L (ref 22–32)
Calcium: 9 mg/dL (ref 8.9–10.3)
Creatinine, Ser: 0.99 mg/dL (ref 0.44–1.00)
GFR calc non Af Amer: 58 mL/min — ABNORMAL LOW (ref >60)
Glucose, Bld: 171 mg/dL — ABNORMAL HIGH (ref 65–99)
POTASSIUM: 3.8 mmol/L (ref 3.5–5.1)
SODIUM: 140 mmol/L (ref 135–145)

## 2015-03-03 LAB — HEMOGLOBIN A1C
Hgb A1c MFr Bld: 6.6 % — ABNORMAL HIGH (ref 4.8–5.6)
Mean Plasma Glucose: 143 mg/dL

## 2015-03-03 LAB — LEGIONELLA ANTIGEN, URINE

## 2015-03-03 LAB — URINE CULTURE

## 2015-03-03 LAB — MAGNESIUM: Magnesium: 1.8 mg/dL (ref 1.7–2.4)

## 2015-03-03 MED ORDER — CARVEDILOL 3.125 MG PO TABS
3.1250 mg | ORAL_TABLET | Freq: Two times a day (BID) | ORAL | Status: DC
  Administered 2015-03-03 – 2015-03-04 (×4): 3.125 mg via ORAL
  Filled 2015-03-03 (×7): qty 1

## 2015-03-03 MED ORDER — CARVEDILOL 3.125 MG PO TABS
3.1250 mg | ORAL_TABLET | Freq: Two times a day (BID) | ORAL | Status: DC
  Filled 2015-03-03 (×2): qty 1

## 2015-03-03 MED ORDER — SODIUM CHLORIDE 0.9 % IJ SOLN
10.0000 mL | INTRAMUSCULAR | Status: DC | PRN
  Administered 2015-03-03: 10 mL
  Administered 2015-03-04: 20 mL
  Filled 2015-03-03: qty 40

## 2015-03-03 MED ORDER — MENTHOL 3 MG MT LOZG
1.0000 | LOZENGE | OROMUCOSAL | Status: DC | PRN
  Administered 2015-03-03: 3 mg via ORAL
  Filled 2015-03-03: qty 9

## 2015-03-03 MED ORDER — LISINOPRIL 2.5 MG PO TABS
2.5000 mg | ORAL_TABLET | Freq: Every day | ORAL | Status: DC
  Administered 2015-03-03 – 2015-03-04 (×2): 2.5 mg via ORAL
  Filled 2015-03-03 (×3): qty 1

## 2015-03-03 MED ORDER — FUROSEMIDE 10 MG/ML IJ SOLN
40.0000 mg | Freq: Two times a day (BID) | INTRAMUSCULAR | Status: DC
  Administered 2015-03-03 – 2015-03-04 (×2): 40 mg via INTRAVENOUS
  Filled 2015-03-03 (×7): qty 4

## 2015-03-03 NOTE — Progress Notes (Signed)
[facility] for Vancomycin/Levaquin  Indication: Pneumonia  Allergies  Allergen Reactions  . Penicillins Diarrhea  . Shellfish Allergy     migraines   Labs:  Recent Labs  03/01/15 1610 03/01/15 1632 03/02/15 0430 03/03/15 0425  WBC 2.9*  --  2.4* 5.2  HGB 9.0* 9.2* 9.0* 8.9*  PLT 55*  --  41* 48*  CREATININE 0.92 0.80 0.95 0.99   Estimated Creatinine Clearance: 54.4 mL/min (by C-G formula based on Cr of 0.99).  Assessment: 64 y/o female continues on Vancomycin and Levaquin for pneumonia Also with non-Hodgkin's lymphoma and new CHF Scr stable, Afebrile, cultures negative to date  Day 2 of broad spectrum antibiotics  Goal of Therapy:  Vancomycin trough level 15-20 mcg/ml  Plan:  Continue Vancomycin 750 mg iv Q 12 hours Continue Levaquin 750 mg iv Q 24 hours Continue to follow   Thank you Anette Guarneri, PharmD [phone] 03/03/2015,9:54 AM

## 2015-03-03 NOTE — Progress Notes (Addendum)
Wrote note yesterday 6/23 about adjusting basal insulin dose to be given yesterday. Basal dose was not adjusted patient's glucose went up into the 300's. Please advise. Patient received Levemir last night, is 140's mg/dl this am. No new adjustments today. Watch trends.  Thanks,  Tama Headings RN, MSN, [facility] Inpatient Diabetes Coordinator Team Pager [phone]

## 2015-03-03 NOTE — Progress Notes (Signed)
[facility] PROGRESS NOTE  Barbette Mcglaun IRC:[id number] DOB: Nov 14, 1950 DOA: 03/01/2015 PCP: Vicenta Aly, FNP  Assessment/Plan:  Principal Problem:   Acute systolic heart failure: new diagnosis. EF 15-20%. Might be chemo related. Has been on rituxan.  Change lasix to 40 bid. Add carvedilol and low dose ace. Will consult cardiology for recs, but likely not an interventional candidate, given pancytopenia, lymphoma, dementia, poor functional status. Doubt bronchiolitis. D/c abx and prednisone. Repeat BNP and CXR in am. My feeling is that prognosis is poor. May need palliative care consult. More family coming tomorrow from [place], including husband. Can discuss further Active Problems:   Non-Hodgkin's lymphoma: per husband, on Rituxan and Bendamustine in [place]   GERD (gastroesophageal reflux disease)   HTN (hypertension)   DM type 2 (diabetes mellitus, type 2): CBGs labile. Will adjust regimen   H/o Autologous bone marrow transplantation status   Pancytopenia: WBC better   Sinus tachycardia: carvedilol.   Dementia without behavioral disturbance. Per daughter "Alzheimers"   Code Status:  full Family Communication:  Daughter at bedside, husband by phone Disposition Plan:    HPI/Subjective: Confused. Felt dyspneic this am  Objective: Filed Vitals:   03/03/15 0524  BP: 108/69  Pulse: 133  Temp: 97.4 F (36.3 C)  Resp: 22    Intake/Output Summary (Last 24 hours) at 03/03/15 0907 Last data filed at 03/03/15 0528  Gross per 24 hour  Intake   1094 ml  Output   1500 ml  Net   -406 ml   Filed Weights   03/02/15 0012 03/02/15 0512 03/03/15 0350  Weight: 68.9 kg (151 lb 14.4 oz) 68.7 kg (151 lb 7.3 oz) 65.3 kg (143 lb 15.4 oz)   Tele: ST  Exam:   General:  Off oxygen. Disoriented to time place and situation  Cardiovascular: fast, regular  Respiratory: bilateral wheeze  Abdomen: S, NT, ND  Ext: trace edema  Basic Metabolic Panel:  Recent Labs Lab  03/01/15 1610 03/01/15 1632 03/02/15 0430 03/03/15 0425  NA 139 139 141 140  K 3.7 3.7 3.6 3.8  CL 110 106 112* 108  CO2 21*  --  19* 24  GLUCOSE 138* 137* 325* 171*  BUN 22* 25* 17 22*  CREATININE 0.92 0.80 0.95 0.99  CALCIUM 8.8*  --  8.6* 9.0   Liver Function Tests:  Recent Labs Lab 03/01/15 1610 03/02/15 0430  AST 19 19  ALT 16 16  ALKPHOS 77 71  BILITOT 0.7 1.0  PROT 5.2* 5.3*  ALBUMIN 3.3* 3.3*   No results for input(s): LIPASE, AMYLASE in the last 168 hours. No results for input(s): AMMONIA in the last 168 hours. CBC:  Recent Labs Lab 03/01/15 1610 03/01/15 1632 03/02/15 0430 03/03/15 0425  WBC 2.9*  --  2.4* 5.2  NEUTROABS 1.7  --  2.2 3.9  HGB 9.0* 9.2* 9.0* 8.9*  HCT 25.7* 27.0* 26.5* 25.7*  MCV 101.6*  --  103.9* 102.4*  PLT 55*  --  41* 48*   Cardiac Enzymes:  Recent Labs Lab 03/02/15 0430 03/02/15 0754  TROPONINI 0.04* 0.04*   BNP (last 3 results)  Recent Labs  03/01/15 1610  BNP 2628.2*    ProBNP (last 3 results) No results for input(s): PROBNP in the last 8760 hours.  CBG:  Recent Labs Lab 03/02/15 0809 03/02/15 1208 03/02/15 1703 03/02/15 2148 03/03/15 0805  GLUCAP 277* 300* 216* 304* 140*    Recent Results (from the past 240 hour(s))  Blood Culture (routine x 2)  Status: None (Preliminary result)   Collection Time: 03/01/15  5:07 PM  Result Value Ref Range Status   Specimen Description BLOOD RIGHT WRIST  Final   Special Requests BOTTLES DRAWN AEROBIC ONLY 3CC  Final   Culture NO GROWTH < 24 HOURS  Final   Report Status PENDING  Incomplete  Blood Culture (routine x 2)     Status: None (Preliminary result)   Collection Time: 03/01/15  5:15 PM  Result Value Ref Range Status   Specimen Description BLOOD LEFT ARM  Final   Special Requests BOTTLES DRAWN AEROBIC AND ANAEROBIC 5CC  Final   Culture NO GROWTH < 24 HOURS  Final   Report Status PENDING  Incomplete  Urine culture     Status: None   Collection Time:  03/01/15  7:25 PM  Result Value Ref Range Status   Specimen Description URINE, CLEAN CATCH  Final   Special Requests NONE  Final   Culture 2,000 COLONIES/mL INSIGNIFICANT GROWTH  Final   Report Status 03/03/2015 FINAL  Final     Studies: Dg Chest 2 View  03/01/2015   CLINICAL DATA:  Cough since yesterday. Shortness of breath with exertion. History of hypertension, asthma.  EXAM: CHEST  2 VIEW  COMPARISON:  05/19/2012  FINDINGS: Right Port-A-Cath remains in place, unchanged. Postsurgical changes in the left breast and axilla. Heart is normal size. Bibasilar opacities likely reflect atelectasis. No effusions. No acute bony abnormality. Mild rightward scoliosis in the thoracolumbar spine.  IMPRESSION: Bibasilar atelectasis.   Electronically Signed   By: Rolm Baptise M.D.   On: 03/01/2015 14:59   Nm Pulmonary Perf And Vent  03/01/2015   CLINICAL DATA:  Shortness of Breath  EXAM: NUCLEAR MEDICINE VENTILATION - PERFUSION LUNG SCAN  TECHNIQUE: Ventilation images were obtained in multiple projections using inhaled aerosol Tc-71m DTPA. Perfusion images were obtained in multiple projections after intravenous injection of Tc-69m MAA.  RADIOPHARMACEUTICALS:  Forty mCi Technetium-78m DTPA aerosol inhalation and 6 Technetium-67m MAA IV  COMPARISON:  Chest x-ray same day  FINDINGS: Ventilation: No focal ventilation defect.  Perfusion: No wedge shaped peripheral perfusion defects to suggest acute pulmonary embolism.  IMPRESSION: Low probability for pulmonary embolus.   Electronically Signed   By: Lahoma Crocker M.D.   On: 03/01/2015 22:18   Dg Chest Port 1 View  03/02/2015   CLINICAL DATA:  Acute onset of shortness of breath. Initial encounter.  EXAM: PORTABLE CHEST - 1 VIEW  COMPARISON:  Chest radiograph performed earlier today at 2:41 p.m.  FINDINGS: The lungs are mildly hypoexpanded. Vascular congestion is noted. Mildly increased interstitial markings may reflect minimal interstitial edema. There is no evidence of  pleural effusion or pneumothorax.  The cardiomediastinal silhouette is borderline normal in size. A right-sided chest port is noted ending about the mid SVC. Clips are seen overlying the left axilla. No acute osseous abnormalities are seen.  IMPRESSION: Lungs mildly hypoexpanded. Vascular congestion noted. Mildly increased interstitial markings may reflect minimal interstitial edema.   Electronically Signed   By: Garald Balding M.D.   On: 03/02/2015 00:04   Echo Left ventricle: The cavity size was mildly dilated. Systolic function was normal. The estimated ejection fraction was in the range of 15% to 20%. Severe diffuse hypokinesis. Although no diagnostic regional wall motion abnormality was identified, this possibility cannot be completely excluded on the basis of this study. - Aortic valve: There was mild regurgitation. - Mitral valve: There was moderate regurgitation. - Left atrium: The atrium was moderately dilated. -  Right atrium: The atrium was moderately dilated. - Tricuspid valve: There was moderate regurgitation. - Pulmonary arteries: Systolic pressure was moderately increased. - Pericardium, extracardiac: A small pericardial effusion was identified not hemodynamically signiticant.. Difficult to tell if there is a loculated posterior effusion or if it is pleural. There was a left pleural effusion.  Scheduled Meds: . acyclovir  800 mg Oral BID  . donepezil  5 mg Oral Daily  . feeding supplement (ENSURE ENLIVE)  237 mL Oral BID BM  . furosemide  40 mg Intravenous Daily  . guaiFENesin  600 mg Oral BID  . insulin aspart  0-15 Units Subcutaneous TID WC  . insulin aspart  0-5 Units Subcutaneous QHS  . insulin detemir  15 Units Subcutaneous QHS  . ipratropium  0.5 mg Nebulization TID  . levofloxacin (LEVAQUIN) IV  750 mg Intravenous Q24H  . pantoprazole  80 mg Oral Daily  . predniSONE  40 mg Oral Q breakfast  . vancomycin  500 mg Intravenous Q12H  . venlafaxine XR   75 mg Oral Q breakfast   Continuous Infusions:   Time spent: 35 minutes  [place] [facility] Pager [phone]. If 7PM-7AM, please contact night-coverage at [email or web address], password [facility] 03/03/2015, 9:07 AM  LOS: 2 days

## 2015-03-03 NOTE — Care Management Note (Signed)
Case Management Note  Patient Details  Name: Victoria Wood MRN: [id number] Date of Birth: [birth year]  Subjective/Objective:      NCM spoke with daughter Tammy, [phone], she states patient will have 24 hr care while she is here with her in [place] for about 3 weeks, between she and her daughters and her Nanny.  Patient has a rollator which is in the room.  NCM asked daughter which agency would she like to work with she states [place], referral made to [facility], [place], and aide ,  Stanton Kidney with [facility] states she will get back with me to make sure they can take patient.                Action/Plan:   Expected Discharge Date:                  Expected Discharge Plan:  Home/Self Care  In-House Referral:     Discharge planning Services  CM Consult  Post Acute Care Choice:    Choice offered to:     DME Arranged:    DME Agency:     HH Arranged:  RN, PT, Nurse's Aide [place] Agency:  [place]  Status of Service:  In process, will continue to follow  Medicare Important Message Given:  Yes Date Medicare IM Given:  03/03/15 Medicare IM give by:  Tomi Bamberger RN Date Additional Medicare IM Given:    Additional Medicare Important Message give by:     If discussed at [place] of Stay Meetings, dates discussed:    Additional Comments:  Zenon Mayo, RN 03/03/2015, 11:27 AM

## 2015-03-03 NOTE — Consult Note (Signed)
Reason for Consult: New CHF Referring Physician: [name] is an 64 y.o. female.  HPI:   She is a 64 year old female with history of GERD, asthma, anemia, hyperlipidemia, diabetes mellitus type 2, non-Hodgkin's lymphoma with last chemotherapy about 6 weeks ago(April) at [facility], autologous bone marrow transplant, dementia and depression.  She has no known prior cardiac history.  She presented with shortness of breath and cough on June 22.  She had a 2-D echocardiogram yesterday which revealed an ejection fraction of 15-20% with severe diffuse hypokinesis, mild AI, moderate MR, moderate we dilated left atrium and right atrium, moderate TR, small pericardial effusion that was not hemodynamically significant. Left pleural effusion.  She was given IV fluids in the ER however that was stopped his BNP was significantly elevated.  Ask her congestion was noted on chest x-ray with minimal interstitial edema. She is currently getting IV Lasix at 40 mg twice daily.  Fluids thus far -1 L.  She was seen at her PCP and was wheezing so they recommended she come to the ER. She reports problems with falling lately.  Some abd pain but not now. + sore throat and cough.   The patient currently denies nausea, vomiting, fever, chest pain, PND,congestion, abdominal pain, hematochezia, melena, lower extremity edema, claudication.  Weight goes up and down.  Sleeps on 2 pillows.    Past Medical History  Diagnosis Date  . Gastric polyps     History of  . GERD (gastroesophageal reflux disease)   . Asthma   . Anemia   . Hyperlipidemia   . DM type 2 (diabetes mellitus, type 2)   . 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 Hypomagnesemia 01/13/2012  . Non Hodgkin's lymphoma     "5 times since she was 27" (03/02/2015)  . Cancer     hx of Rituxan in 07/2011  . Complication of anesthesia     has woken up during surgery   . History of blood transfusion "several"   "related to her chemo"  . Dementia     past early stage/spouse (03/02/2015)  . Depression     Past Surgical History  Procedure Laterality Date  . Portacath placement  X 2  . Upper gastrointestinal endoscopy  02/2011    gastric polyps x 2  . Lymph node dissection      neck and thigh surgery  . Esophagogastroduodenoscopy  08/22/2011    Procedure: ESOPHAGOGASTRODUODENOSCOPY (EGD);  Surgeon: Inda Castle, MD;  Location: [facility];  Service: Endoscopy;  Laterality: N/A;  . Lymph node biopsy Left 10/02/11    axillary  . Reduction mammaplasty Bilateral   . Bone marrow transplant  ~ 2014    "@ [facility]"    Family History  Problem Relation Age of Onset  . Hypertension Mother   . Cancer Mother     breast  . Hypertension Father   . Kidney disease Father   . Cancer Father     throat  . Cancer Maternal Grandmother     breast    Social History:  reports that she has never smoked. She has never used smokeless tobacco. She reports that she drinks alcohol. She reports that she does not use illicit drugs.  Allergies:  Allergies  Allergen Reactions  . Penicillins Diarrhea  . Shellfish Allergy     migraines    Medications:  Scheduled Meds: . acyclovir  800 mg Oral BID  . carvedilol  3.125 mg  Oral BID WC  . donepezil  5 mg Oral Daily  . feeding supplement (ENSURE ENLIVE)  237 mL Oral BID BM  . furosemide  40 mg Intravenous BID  . guaiFENesin  600 mg Oral BID  . insulin aspart  0-15 Units Subcutaneous TID WC  . insulin aspart  0-5 Units Subcutaneous QHS  . insulin detemir  15 Units Subcutaneous QHS  . ipratropium  0.5 mg Nebulization TID  . lisinopril  2.5 mg Oral Daily  . pantoprazole  80 mg Oral Daily  . venlafaxine XR  75 mg Oral Q breakfast   Continuous Infusions:  PRN Meds:.levalbuterol, menthol-cetylpyridinium, sodium chloride   Results for orders placed or performed during the hospital encounter of 03/01/15 (from the past 48 hour(s))  I-stat troponin, ED      Status: None   Collection Time: 03/01/15  4:58 PM  Result Value Ref Range   Troponin i, poc 0.06 0.00 - 0.08 ng/mL   Comment 3            Comment: Due to the release kinetics of cTnI, a negative result within the first hours of the onset of symptoms does not rule out myocardial infarction with certainty. If myocardial infarction is still suspected, repeat the test at appropriate intervals.   Blood Culture (routine x 2)     Status: None (Preliminary result)   Collection Time: 03/01/15  5:07 PM  Result Value Ref Range   Specimen Description BLOOD RIGHT WRIST    Special Requests BOTTLES DRAWN AEROBIC ONLY 3CC    Culture NO GROWTH 2 DAYS    Report Status PENDING   Blood Culture (routine x 2)     Status: None (Preliminary result)   Collection Time: 03/01/15  5:15 PM  Result Value Ref Range   Specimen Description BLOOD LEFT ARM    Special Requests BOTTLES DRAWN AEROBIC AND ANAEROBIC 5CC    Culture NO GROWTH 2 DAYS    Report Status PENDING   I-Stat CG4 Lactic Acid, ED  (not at  [facility])     Status: None   Collection Time: 03/01/15  5:23 PM  Result Value Ref Range   Lactic Acid, Venous 1.07 0.5 - 2.0 mmol/L  Urinalysis, Routine w reflex microscopic (not at [facility])     Status: None   Collection Time: 03/01/15  7:25 PM  Result Value Ref Range   Color, Urine YELLOW YELLOW   APPearance CLEAR CLEAR   Specific Gravity, Urine 1.009 1.005 - 1.030   pH 5.0 5.0 - 8.0   Glucose, UA NEGATIVE NEGATIVE mg/dL   Hgb urine dipstick NEGATIVE NEGATIVE   Bilirubin Urine NEGATIVE NEGATIVE   Ketones, ur NEGATIVE NEGATIVE mg/dL   Protein, ur NEGATIVE NEGATIVE mg/dL   Urobilinogen, UA 0.2 0.0 - 1.0 mg/dL   Nitrite NEGATIVE NEGATIVE   Leukocytes, UA NEGATIVE NEGATIVE    Comment: MICROSCOPIC NOT DONE ON URINES WITH NEGATIVE PROTEIN, BLOOD, LEUKOCYTES, NITRITE, OR GLUCOSE <1000 mg/dL.  Urine culture     Status: None   Collection Time: 03/01/15  7:25 PM  Result Value Ref Range   Specimen Description URINE,  CLEAN CATCH    Special Requests NONE    Culture 2,000 COLONIES/mL INSIGNIFICANT GROWTH    Report Status 03/03/2015 FINAL   I-Stat arterial blood gas, ED     Status: Abnormal   Collection Time: 03/01/15 11:29 PM  Result Value Ref Range   pH, Arterial 7.379 7.350 - 7.450   pCO2 arterial 30.8 (L) 35.0 - 45.0  mmHg   pO2, Arterial 103.0 (H) 80.0 - 100.0 mmHg   Bicarbonate 18.2 (L) 20.0 - 24.0 mEq/L   TCO2 19 0 - 100 mmol/L   O2 Saturation 98.0 %   Acid-base deficit 6.0 (H) 0.0 - 2.0 mmol/L   Patient temperature 98.6 F    Collection site RADIAL, ALLEN'S TEST ACCEPTABLE    Drawn by RT    Sample type ARTERIAL   Glucose, capillary     Status: Abnormal   Collection Time: 03/02/15 12:15 AM  Result Value Ref Range   Glucose-Capillary 225 (H) 65 - 99 mg/dL   Comment 1 Notify RN    Comment 2 Document in Chart   Glucose, capillary     Status: Abnormal   Collection Time: 03/02/15  2:47 AM  Result Value Ref Range   Glucose-Capillary 314 (H) 65 - 99 mg/dL   Comment 1 Notify RN    Comment 2 Document in Chart   Hemoglobin A1c     Status: Abnormal   Collection Time: 03/02/15  4:30 AM  Result Value Ref Range   Hgb A1c MFr Bld 6.6 (H) 4.8 - 5.6 %    Comment: (NOTE)         Pre-diabetes: 5.7 - 6.4         Diabetes: >6.4         Glycemic control for adults with diabetes: <7.0    Mean Plasma Glucose 143 mg/dL    Comment: (NOTE) Performed At: [facility] [place], [place] [id number] Lindon Romp MD YI:[id number]   CBC with Differential/Platelet     Status: Abnormal   Collection Time: 03/02/15  4:30 AM  Result Value Ref Range   WBC 2.4 (L) 4.0 - 10.5 K/uL   RBC 2.55 (L) 3.87 - 5.11 MIL/uL   Hemoglobin 9.0 (L) 12.0 - 15.0 g/dL   HCT 26.5 (L) 36.0 - 46.0 %   MCV 103.9 (H) 78.0 - 100.0 fL   MCH 35.3 (H) 26.0 - 34.0 pg   MCHC 34.0 30.0 - 36.0 g/dL   RDW 19.4 (H) 11.5 - 15.5 %   Platelets 41 (L) 150 - 400 K/uL    Comment: CONSISTENT WITH PREVIOUS RESULT    Neutrophils Relative % 91 (H) 43 - 77 %   Neutro Abs 2.2 1.7 - 7.7 K/uL   Lymphocytes Relative 6 (L) 12 - 46 %   Lymphs Abs 0.1 (L) 0.7 - 4.0 K/uL   Monocytes Relative 2 (L) 3 - 12 %   Monocytes Absolute 0.1 0.1 - 1.0 K/uL   Eosinophils Relative 0 0 - 5 %   Eosinophils Absolute 0.0 0.0 - 0.7 K/uL   Basophils Relative 0 0 - 1 %   Basophils Absolute 0.0 0.0 - 0.1 K/uL  Comprehensive metabolic panel     Status: Abnormal   Collection Time: 03/02/15  4:30 AM  Result Value Ref Range   Sodium 141 135 - 145 mmol/L   Potassium 3.6 3.5 - 5.1 mmol/L   Chloride 112 (H) 101 - 111 mmol/L   CO2 19 (L) 22 - 32 mmol/L   Glucose, Bld 325 (H) 65 - 99 mg/dL   BUN 17 6 - 20 mg/dL   Creatinine, Ser 0.95 0.44 - 1.00 mg/dL   Calcium 8.6 (L) 8.9 - 10.3 mg/dL   Total Protein 5.3 (L) 6.5 - 8.1 g/dL   Albumin 3.3 (L) 3.5 - 5.0 g/dL   AST 19 15 - 41 U/L   ALT 16  14 - 54 U/L   Alkaline Phosphatase 71 38 - 126 U/L   Total Bilirubin 1.0 0.3 - 1.2 mg/dL   GFR calc non Af Amer >60 >60 mL/min   GFR calc Af Amer >60 >60 mL/min    Comment: (NOTE) The eGFR has been calculated using the CKD EPI equation. This calculation has not been validated in all clinical situations. eGFR's persistently <60 mL/min signify possible Chronic Kidney Disease.    Anion gap 10 5 - 15  Protime-INR     Status: None   Collection Time: 03/02/15  4:30 AM  Result Value Ref Range   Prothrombin Time 15.2 11.6 - 15.2 seconds   INR 1.18 0.00 - 1.49  Troponin I (q 6hr x 3)     Status: Abnormal   Collection Time: 03/02/15  4:30 AM  Result Value Ref Range   Troponin I 0.04 (H) <0.031 ng/mL    Comment:        PERSISTENTLY INCREASED TROPONIN VALUES IN THE RANGE OF 0.04-0.49 ng/mL CAN BE SEEN IN:       -UNSTABLE ANGINA       -CONGESTIVE HEART FAILURE       -MYOCARDITIS       -CHEST TRAUMA       -ARRYHTHMIAS       -LATE PRESENTING MYOCARDIAL INFARCTION       -COPD   CLINICAL FOLLOW-UP RECOMMENDED.   Lactate dehydrogenase     Status:  Abnormal   Collection Time: 03/02/15  4:30 AM  Result Value Ref Range   LDH 207 (H) 98 - 192 U/L  TSH     Status: None   Collection Time: 03/02/15  4:30 AM  Result Value Ref Range   TSH 3.371 0.350 - 4.500 uIU/mL  Legionella antigen, urine  (not at [facility])     Status: None   Collection Time: 03/02/15  5:34 AM  Result Value Ref Range   Specimen Description URINE, RANDOM    Special Requests NONE    Legionella Antigen, Urine      Negative for Legionella pneumophila serogroup 1                                                              Legionella pneumophila serogroup 1 antigen can be detected in urine within 2 to 3 days of infection and may persist even after treatment. This  assay does not detect other Legionella species or serogroups. Performed at [facility]    Report Status 03/03/2015 FINAL   Strep pneumoniae urinary antigen  (not at [facility])     Status: None   Collection Time: 03/02/15  5:34 AM  Result Value Ref Range   Strep Pneumo Urinary Antigen NEGATIVE NEGATIVE    Comment:        Infection due to S. pneumoniae cannot be absolutely ruled out since the antigen present may be below the detection limit of the test.   Troponin I (q 6hr x 3)     Status: Abnormal   Collection Time: 03/02/15  7:54 AM  Result Value Ref Range   Troponin I 0.04 (H) <0.031 ng/mL    Comment:        PERSISTENTLY INCREASED TROPONIN VALUES IN THE RANGE OF 0.04-0.49 ng/mL CAN BE SEEN IN:       -  UNSTABLE ANGINA       -CONGESTIVE HEART FAILURE       -MYOCARDITIS       -CHEST TRAUMA       -ARRYHTHMIAS       -LATE PRESENTING MYOCARDIAL INFARCTION       -COPD   CLINICAL FOLLOW-UP RECOMMENDED.   Glucose, capillary     Status: Abnormal   Collection Time: 03/02/15  8:09 AM  Result Value Ref Range   Glucose-Capillary 277 (H) 65 - 99 mg/dL  Glucose, capillary     Status: Abnormal   Collection Time: 03/02/15 12:08 PM  Result Value Ref Range   Glucose-Capillary 300 (H) 65 - 99 mg/dL  Glucose,  capillary     Status: Abnormal   Collection Time: 03/02/15  5:03 PM  Result Value Ref Range   Glucose-Capillary 216 (H) 65 - 99 mg/dL  Glucose, capillary     Status: Abnormal   Collection Time: 03/02/15  9:48 PM  Result Value Ref Range   Glucose-Capillary 304 (H) 65 - 99 mg/dL  CBC with Differential/Platelet     Status: Abnormal   Collection Time: 03/03/15  4:25 AM  Result Value Ref Range   WBC 5.2 4.0 - 10.5 K/uL   RBC 2.51 (L) 3.87 - 5.11 MIL/uL   Hemoglobin 8.9 (L) 12.0 - 15.0 g/dL   HCT 25.7 (L) 36.0 - 46.0 %   MCV 102.4 (H) 78.0 - 100.0 fL   MCH 35.5 (H) 26.0 - 34.0 pg   MCHC 34.6 30.0 - 36.0 g/dL   RDW 19.6 (H) 11.5 - 15.5 %   Platelets 48 (L) 150 - 400 K/uL    Comment: CONSISTENT WITH PREVIOUS RESULT   Neutrophils Relative % 75 43 - 77 %   Neutro Abs 3.9 1.7 - 7.7 K/uL   Lymphocytes Relative 9 (L) 12 - 46 %   Lymphs Abs 0.4 (L) 0.7 - 4.0 K/uL   Monocytes Relative 16 (H) 3 - 12 %   Monocytes Absolute 0.9 0.1 - 1.0 K/uL   Eosinophils Relative 0 0 - 5 %   Eosinophils Absolute 0.0 0.0 - 0.7 K/uL   Basophils Relative 0 0 - 1 %   Basophils Absolute 0.0 0.0 - 0.1 K/uL  Basic metabolic panel     Status: Abnormal   Collection Time: 03/03/15  4:25 AM  Result Value Ref Range   Sodium 140 135 - 145 mmol/L   Potassium 3.8 3.5 - 5.1 mmol/L   Chloride 108 101 - 111 mmol/L   CO2 24 22 - 32 mmol/L   Glucose, Bld 171 (H) 65 - 99 mg/dL   BUN 22 (H) 6 - 20 mg/dL   Creatinine, Ser 0.99 0.44 - 1.00 mg/dL   Calcium 9.0 8.9 - 10.3 mg/dL   GFR calc non Af Amer 58 (L) >60 mL/min   GFR calc Af Amer >60 >60 mL/min    Comment: (NOTE) The eGFR has been calculated using the CKD EPI equation. This calculation has not been validated in all clinical situations. eGFR's persistently <60 mL/min signify possible Chronic Kidney Disease.    Anion gap 8 5 - 15  Glucose, capillary     Status: Abnormal   Collection Time: 03/03/15  8:05 AM  Result Value Ref Range   Glucose-Capillary 140 (H) 65 - 99  mg/dL  Glucose, capillary     Status: None   Collection Time: 03/03/15 11:48 AM  Result Value Ref Range   Glucose-Capillary 96 65 - 99 mg/dL  Nm Pulmonary Perf And Vent  03/01/2015   CLINICAL DATA:  Shortness of Breath  EXAM: NUCLEAR MEDICINE VENTILATION - PERFUSION LUNG SCAN  TECHNIQUE: Ventilation images were obtained in multiple projections using inhaled aerosol Tc-55mDTPA. Perfusion images were obtained in multiple projections after intravenous injection of Tc-954mAA.  RADIOPHARMACEUTICALS:  Forty mCi Technetium-992mPA aerosol inhalation and 6 Technetium-37m63m IV  COMPARISON:  Chest x-ray same day  FINDINGS: Ventilation: No focal ventilation defect.  Perfusion: No wedge shaped peripheral perfusion defects to suggest acute pulmonary embolism.  IMPRESSION: Low probability for pulmonary embolus.   Electronically Signed   By: LiviLahoma Crocker.   On: 03/01/2015 22:18   Dg Chest Port 1 View  03/02/2015   CLINICAL DATA:  Acute onset of shortness of breath. Initial encounter.  EXAM: PORTABLE CHEST - 1 VIEW  COMPARISON:  Chest radiograph performed earlier today at 2:41 p.m.  FINDINGS: The lungs are mildly hypoexpanded. Vascular congestion is noted. Mildly increased interstitial markings may reflect minimal interstitial edema. There is no evidence of pleural effusion or pneumothorax.  The cardiomediastinal silhouette is borderline normal in size. A right-sided chest port is noted ending about the mid SVC. Clips are seen overlying the left axilla. No acute osseous abnormalities are seen.  IMPRESSION: Lungs mildly hypoexpanded. Vascular congestion noted. Mildly increased interstitial markings may reflect minimal interstitial edema.   Electronically Signed   By: JeffGarald Balding.   On: 03/02/2015 00:04    Review of Systems  Constitutional: Negative for fever, chills and diaphoresis.  HENT: Positive for sore throat. Negative for congestion.   Respiratory: Positive for cough and shortness of breath.     Cardiovascular: Negative for chest pain, orthopnea (Sleeps on two pillows), leg swelling and PND.  Gastrointestinal: Negative for nausea, vomiting and abdominal pain.  Musculoskeletal: Negative for myalgias.  Neurological: Positive for dizziness.  All other systems reviewed and are negative.  Blood pressure 87/57, pulse 123, temperature 97.5 F (36.4 C), temperature source Oral, resp. rate 20, height 5' 7"  (1.702 m), weight 143 lb 15.4 oz (65.3 kg), SpO2 93 %. Physical Exam  Nursing note and vitals reviewed. Constitutional: She appears well-developed and well-nourished. No distress.  HENT:  Head: Normocephalic and atraumatic.  Eyes: EOM are normal. Pupils are equal, round, and reactive to light. No scleral icterus.  Neck: Normal range of motion. Neck supple. JVD present.  Cardiovascular: Regular rhythm, S1 normal and S2 normal.  Tachycardia present.   Murmur heard.  Systolic murmur is present with a grade of 1/6  Pulses:      Radial pulses are 2+ on the right side, and 2+ on the left side.       Dorsalis pedis pulses are 2+ on the right side, and 2+ on the left side.  No carotid Bruit  Respiratory: Effort normal and breath sounds normal. She has no wheezes. She has no rales.  GI: Soft. Bowel sounds are normal. She exhibits no distension. There is tenderness (Mildly so on the right).  Musculoskeletal: She exhibits no edema.  Lymphadenopathy:    She has no cervical adenopathy.  Neurological: She is alert. She exhibits normal muscle tone.  [place] repeated [place] Skin is warm and dry.  Psychiatric: She has a normal mood and affect.   Study Conclusions  - Left ventricle: The cavity size was mildly dilated. Systolic function was normal. The estimated ejection fraction was in the range of 15% to 20%. Severe  diffuse hypokinesis. Although no diagnostic regional wall motion abnormality was identified, this possibility cannot be completely  excluded on the basis of this study. - Aortic valve: There was mild regurgitation. - Mitral valve: There was moderate regurgitation. - Left atrium: The atrium was moderately dilated. - Right atrium: The atrium was moderately dilated. - Tricuspid valve: There was moderate regurgitation. - Pulmonary arteries: Systolic pressure was moderately increased. - Pericardium, extracardiac: A small pericardial effusion was identified not hemodynamically signiticant.. Difficult to tell if there is a loculated posterior effusion or if it is pleural. There was a left pleural effusion.  Assessment/Plan: Principal Problem:   Acute systolic heart failure Active Problems:   Non-Hodgkin's lymphoma   GERD (gastroesophageal reflux disease)   HTN (hypertension)   DM type 2 (diabetes mellitus, type 2)   Autologous bone marrow transplantation status   Pancytopenia   Sinus tachycardia   Dementia without behavioral disturbance   cardiomyopathy, unspecified, EF 15-20%  EKG on 6/22 showed sinus tach 121 with a long QT interval.  She has been consistently at that rate for two days and likely longer.  No obvious ischemic changes. Check Magnesium.  She is getting coreg 3.125.  Effexor can cause tachycardia.   Potassium ok.  Never had CP and troponin was only 0.04 x 2.   VQ with low probability of PE. Blood cultures pending.    New cardiomyopathy with EF of 15-20%, diffuse hypokinesis.  BNP 2628 on 6/22. Fluids: -1.0L. On IV lasix 40 bid(two dose thus far).  JVD may be a little elevated.  No obvious volume overload.  Reevaluate switching to PO lasix tomorrow morning.  Monitor K+.  SCr stable.  Hypotension will limit meds.   I do not think this is ischemic related.  Possibly tachy mediated.  Viral? Chemotherapy induced?  No ETOH.    MD to follow.   Tarri Fuller, Kathleen 03/03/2015, 4:53 PM

## 2015-03-04 ENCOUNTER — Inpatient Hospital Stay (HOSPITAL_COMMUNITY): Payer: Medicare Other

## 2015-03-04 DIAGNOSIS — Z515 Encounter for palliative care: Secondary | ICD-10-CM | POA: Insufficient documentation

## 2015-03-04 DIAGNOSIS — I10 Essential (primary) hypertension: Secondary | ICD-10-CM

## 2015-03-04 DIAGNOSIS — I471 Supraventricular tachycardia: Secondary | ICD-10-CM

## 2015-03-04 DIAGNOSIS — I5021 Acute systolic (congestive) heart failure: Principal | ICD-10-CM

## 2015-03-04 LAB — GLUCOSE, CAPILLARY
GLUCOSE-CAPILLARY: 117 mg/dL — AB (ref 65–99)
Glucose-Capillary: 51 mg/dL — ABNORMAL LOW (ref 65–99)
Glucose-Capillary: 163 mg/dL — ABNORMAL HIGH (ref 65–99)
Glucose-Capillary: 194 mg/dL — ABNORMAL HIGH (ref 65–99)
Glucose-Capillary: 119 mg/dL — ABNORMAL HIGH (ref 65–99)

## 2015-03-04 LAB — BASIC METABOLIC PANEL
Anion gap: 11 (ref 5–15)
BUN: 27 mg/dL — AB (ref 6–20)
CALCIUM: 9.2 mg/dL (ref 8.9–10.3)
CO2: 27 mmol/L (ref 22–32)
Chloride: 106 mmol/L (ref 101–111)
Creatinine, Ser: 1.14 mg/dL — ABNORMAL HIGH (ref 0.44–1.00)
GFR, EST AFRICAN AMERICAN: 57 mL/min — AB (ref >60)
GFR, EST NON AFRICAN AMERICAN: 49 mL/min — AB (ref >60)
Glucose, Bld: 65 mg/dL (ref 65–99)
Potassium: 2.9 mmol/L — ABNORMAL LOW (ref 3.5–5.1)
Sodium: 144 mmol/L (ref 135–145)

## 2015-03-04 LAB — CBC WITH DIFFERENTIAL/PLATELET
BASOS ABS: 0 10*3/uL (ref 0.0–0.1)
BASOS PCT: 0 % (ref 0–1)
EOS PCT: 1 % (ref 0–5)
Eosinophils Absolute: 0 10*3/uL (ref 0.0–0.7)
HEMATOCRIT: 26.1 % — AB (ref 36.0–46.0)
HEMOGLOBIN: 9 g/dL — AB (ref 12.0–15.0)
Lymphocytes Relative: 14 % (ref 12–46)
Lymphs Abs: 0.7 10*3/uL (ref 0.7–4.0)
MCH: 35.7 pg — AB (ref 26.0–34.0)
MCHC: 34.5 g/dL (ref 30.0–36.0)
MCV: 103.6 fL — AB (ref 78.0–100.0)
Monocytes Absolute: 0.8 10*3/uL (ref 0.1–1.0)
Monocytes Relative: 17 % — ABNORMAL HIGH (ref 3–12)
NEUTROS ABS: 3.2 10*3/uL (ref 1.7–7.7)
NEUTROS PCT: 67 % (ref 43–77)
Platelets: 44 10*3/uL — ABNORMAL LOW (ref 150–400)
RBC: 2.52 MIL/uL — AB (ref 3.87–5.11)
RDW: 19.8 % — AB (ref 11.5–15.5)
WBC: 4.7 10*3/uL (ref 4.0–10.5)

## 2015-03-04 LAB — MRSA PCR SCREENING: MRSA by PCR: NEGATIVE

## 2015-03-04 LAB — BRAIN NATRIURETIC PEPTIDE

## 2015-03-04 MED ORDER — GUAIFENESIN ER 600 MG PO TB12
600.0000 mg | ORAL_TABLET | Freq: Two times a day (BID) | ORAL | Status: DC | PRN
  Filled 2015-03-04: qty 1

## 2015-03-04 MED ORDER — SODIUM CHLORIDE 0.9 % IJ SOLN
10.0000 mL | Freq: Two times a day (BID) | INTRAMUSCULAR | Status: DC
  Administered 2015-03-04 – 2015-03-09 (×6): 10 mL
  Administered 2015-03-10: 20 mL
  Administered 2015-03-11: 10 mL

## 2015-03-04 MED ORDER — POTASSIUM CHLORIDE CRYS ER 20 MEQ PO TBCR
40.0000 meq | EXTENDED_RELEASE_TABLET | Freq: Two times a day (BID) | ORAL | Status: DC
Start: 2015-03-04 — End: 2015-03-06
  Administered 2015-03-04 – 2015-03-05 (×4): 40 meq via ORAL
  Filled 2015-03-04 (×7): qty 2

## 2015-03-04 MED ORDER — POTASSIUM CHLORIDE 10 MEQ/100ML IV SOLN: 10.0000 meq | INTRAVENOUS | Status: DC

## 2015-03-04 MED ORDER — SODIUM CHLORIDE 0.9 % IJ SOLN
10.0000 mL | INTRAMUSCULAR | Status: DC | PRN
  Administered 2015-03-11: 10 mL
  Filled 2015-03-04: qty 40

## 2015-03-04 MED ORDER — DEXTROSE 50 % IV SOLN
INTRAVENOUS | Status: AC
  Administered 2015-03-04: 50 mL
  Filled 2015-03-04: qty 50

## 2015-03-04 MED ORDER — POTASSIUM CHLORIDE 10 MEQ/50ML IV SOLN
10.0000 meq | INTRAVENOUS | Status: DC
  Administered 2015-03-04 (×4): 10 meq via INTRAVENOUS
  Filled 2015-03-04 (×4): qty 50

## 2015-03-04 NOTE — Progress Notes (Signed)
[facility] PROGRESS NOTE  Victoria Wood NWG:[id number] DOB: Jun 05, 1951 DOA: 03/01/2015 PCP: Vicenta Aly, FNP  Assessment/Plan:  Principal Problem:   Acute systolic heart failure: new diagnosis. EF 15-20%. Might be chemo related. Blood pressures have been running low and continues to be tachycardic. Nurses have held several doses of beta blockers/Lasix/ACE inhibitor. Discussed with Dr. Radford Pax. Transferring to stepdown for possible inotropic therapy and heart failure consult. I suspect prognosis is poor, given the severity of her cardiomyopathy, lymphoma, dementia, poor functional status. I shared this with the patient who seemed to be in a lucid moment as well as with her daughter. Family members are flying in from [place]. Have consult the palliative medicine to assist with goals of care.  When asked about CODE STATUS patient replies "I never thought about it". Remains full code. Active Problems:   Non-Hodgkin's lymphoma: per husband, on Rituxan and Bendamustine in [place]   GERD (gastroesophageal reflux disease)   DM type 2 (diabetes mellitus, type 2): CBGs controlled   H/o Autologous bone marrow transplantation status   Pancytopenia: WBC better   Sinus tachycardia: Hypotension limiting titration of beta blocker   Dementia without behavioral disturbance. Per daughter "Alzheimers" Hypokalemia: Replete and check magnesium.   Code Status:  full Family Communication:  Daughter at bedside Disposition Plan: ?    HPI/Subjective: Coughed up some blood-tinged sputum today. Denies dyspnea, but daughter noted labored breathing this morning. Excited about family members arriving from [place].  Objective: Filed Vitals:   03/04/15 0528  BP: 106/69  Pulse: 119  Temp: 98.2 F (36.8 C)  Resp: 17    Intake/Output Summary (Last 24 hours) at 03/04/15 1208 Last data filed at 03/04/15 5784  Gross per 24 hour  Intake    240 ml  Output   3450 ml  Net  -3210 ml   Filed Weights   03/02/15 0512 03/03/15 0350 03/04/15 0416  Weight: 68.7 kg (151 lb 7.3 oz) 65.3 kg (143 lb 15.4 oz) 61.553 kg (135 lb 11.2 oz)   Tele: ST  Exam:   General:  Off oxygen. Seems more appropriate today.  Cardiovascular: fast, regular  Respiratory: bilateral rales  Abdomen: S, NT, ND  Ext: trace edema  Basic Metabolic Panel:  Recent Labs Lab 03/01/15 1610 03/01/15 1632 03/02/15 0430 03/03/15 0425 03/03/15 1914 03/04/15 0500  NA 139 139 141 140  --  144  K 3.7 3.7 3.6 3.8  --  2.9*  CL 110 106 112* 108  --  106  CO2 21*  --  19* 24  --  27  GLUCOSE 138* 137* 325* 171*  --  65  BUN 22* 25* 17 22*  --  27*  CREATININE 0.92 0.80 0.95 0.99  --  1.14*  CALCIUM 8.8*  --  8.6* 9.0  --  9.2  MG  --   --   --   --  1.8  --    Liver Function Tests:  Recent Labs Lab 03/01/15 1610 03/02/15 0430  AST 19 19  ALT 16 16  ALKPHOS 77 71  BILITOT 0.7 1.0  PROT 5.2* 5.3*  ALBUMIN 3.3* 3.3*   No results for input(s): LIPASE, AMYLASE in the last 168 hours. No results for input(s): AMMONIA in the last 168 hours. CBC:  Recent Labs Lab 03/01/15 1610 03/01/15 1632 03/02/15 0430 03/03/15 0425 03/04/15 0500  WBC 2.9*  --  2.4* 5.2 4.7  NEUTROABS 1.7  --  2.2 3.9 3.2  HGB 9.0* 9.2* 9.0* 8.9* 9.0*  HCT  25.7* 27.0* 26.5* 25.7* 26.1*  MCV 101.6*  --  103.9* 102.4* 103.6*  PLT 55*  --  41* 48* 44*   Cardiac Enzymes:  Recent Labs Lab 03/02/15 0430 03/02/15 0754  TROPONINI 0.04* 0.04*   BNP (last 3 results)  Recent Labs  03/01/15 1610  BNP 2628.2*    ProBNP (last 3 results) No results for input(s): PROBNP in the last 8760 hours.  CBG:  Recent Labs Lab 03/03/15 1148 03/03/15 1714 03/03/15 2159 03/04/15 0806 03/04/15 0911  GLUCAP 96 158* 115* 51* 163*    Recent Results (from the past 240 hour(s))  Blood Culture (routine x 2)     Status: None (Preliminary result)   Collection Time: 03/01/15  5:07 PM  Result Value Ref Range Status   Specimen Description  BLOOD RIGHT WRIST  Final   Special Requests BOTTLES DRAWN AEROBIC ONLY 3CC  Final   Culture NO GROWTH 2 DAYS  Final   Report Status PENDING  Incomplete  Blood Culture (routine x 2)     Status: None (Preliminary result)   Collection Time: 03/01/15  5:15 PM  Result Value Ref Range Status   Specimen Description BLOOD LEFT ARM  Final   Special Requests BOTTLES DRAWN AEROBIC AND ANAEROBIC 5CC  Final   Culture NO GROWTH 2 DAYS  Final   Report Status PENDING  Incomplete  Urine culture     Status: None   Collection Time: 03/01/15  7:25 PM  Result Value Ref Range Status   Specimen Description URINE, CLEAN CATCH  Final   Special Requests NONE  Final   Culture 2,000 COLONIES/mL INSIGNIFICANT GROWTH  Final   Report Status 03/03/2015 FINAL  Final     Studies: Dg Chest Port 1 View  03/04/2015   CLINICAL DATA:  CHF.  EXAM: PORTABLE CHEST - 1 VIEW  COMPARISON:  03/01/2015  FINDINGS: Right IJ Port-A-Cath unchanged. Examination demonstrates the lungs to be hypoinflated with worsening left base opacification likely a small effusion with associated atelectasis. Possible small amount right pleural fluid. Evidence of persistent mild vascular congestion. Borderline stable cardiomegaly. Remainder of the exam is unchanged.  IMPRESSION: Worsening left base opacification likely small effusion with atelectasis. Possible small amount right pleural fluid. Persistent mild vascular congestion.   Electronically Signed   By: Marin Olp M.D.   On: 03/04/2015 10:36   Echo Left ventricle: The cavity size was mildly dilated. Systolic function was normal. The estimated ejection fraction was in the range of 15% to 20%. Severe diffuse hypokinesis. Although no diagnostic regional wall motion abnormality was identified, this possibility cannot be completely excluded on the basis of this study. - Aortic valve: There was mild regurgitation. - Mitral valve: There was moderate regurgitation. - Left atrium: The atrium  was moderately dilated. - Right atrium: The atrium was moderately dilated. - Tricuspid valve: There was moderate regurgitation. - Pulmonary arteries: Systolic pressure was moderately increased. - Pericardium, extracardiac: A small pericardial effusion was identified not hemodynamically signiticant.. Difficult to tell if there is a loculated posterior effusion or if it is pleural. There was a left pleural effusion.  Scheduled Meds: . acyclovir  800 mg Oral BID  . carvedilol  3.125 mg Oral BID WC  . donepezil  5 mg Oral Daily  . feeding supplement (ENSURE ENLIVE)  237 mL Oral BID BM  . furosemide  40 mg Intravenous BID  . insulin aspart  0-15 Units Subcutaneous TID WC  . insulin aspart  0-5 Units Subcutaneous QHS  .  insulin detemir  15 Units Subcutaneous QHS  . ipratropium  0.5 mg Nebulization TID  . lisinopril  2.5 mg Oral Daily  . pantoprazole  80 mg Oral Daily  . potassium chloride  40 mEq Oral BID  . venlafaxine XR  75 mg Oral Q breakfast   Continuous Infusions:   Time spent: 35 minutes  [facility] Pager [phone]. If 7PM-7AM, please contact night-coverage at [email or web address], password [facility] 03/04/2015, 12:08 PM  LOS: 3 days

## 2015-03-04 NOTE — Progress Notes (Signed)
Pt. Transferred to [place] rom 18, situated in room. Report given prior to transporting patient to new room.

## 2015-03-04 NOTE — Progress Notes (Signed)
SUBJECTIVE:  More labored breathing today  OBJECTIVE:   Vitals:   Filed Vitals:   03/03/15 2201 03/04/15 0416 03/04/15 0528 03/04/15 0754  BP: 96/53  106/69   Pulse: 119  119   Temp: 98.2 F (36.8 C)  98.2 F (36.8 C)   TempSrc: Oral  Oral   Resp: 16  17   Height:      Weight:  135 lb 11.2 oz (61.553 kg)    SpO2: 91%  91% 93%   I&O's:   Intake/Output Summary (Last 24 hours) at 03/04/15 1120 Last data filed at 03/04/15 0947  Gross per 24 hour  Intake    240 ml  Output   3450 ml  Net  -3210 ml   TELEMETRY: Reviewed telemetry pt in sinus tachycardia at 106pmb.:     PHYSICAL EXAM General: Well developed, well nourished, in no acute distress Head: Eyes PERRLA, No xanthomas.   Normal cephalic and atramatic  Lungs:   Crackles at bases Heart:   HRRR S1 S2 Pulses are 2+ & equal. Abdomen: Bowel sounds are positive, abdomen soft and non-tender without masses Extremities:   No clubbing, cyanosis or edema.  DP +1 Neuro: Alert and oriented X 3. Psych:  Good affect, responds appropriately   LABS: Basic Metabolic Panel:  Recent Labs  03/03/15 0425 03/03/15 1914 03/04/15 0500  NA 140  --  144  K 3.8  --  2.9*  CL 108  --  106  CO2 24  --  27  GLUCOSE 171*  --  65  BUN 22*  --  27*  CREATININE 0.99  --  1.14*  CALCIUM 9.0  --  9.2  MG  --  1.8  --    Liver Function Tests:  Recent Labs  03/01/15 1610 03/02/15 0430  AST 19 19  ALT 16 16  ALKPHOS 77 71  BILITOT 0.7 1.0  PROT 5.2* 5.3*  ALBUMIN 3.3* 3.3*   No results for input(s): LIPASE, AMYLASE in the last 72 hours. CBC:  Recent Labs  03/03/15 0425 03/04/15 0500  WBC 5.2 4.7  NEUTROABS 3.9 3.2  HGB 8.9* 9.0*  HCT 25.7* 26.1*  MCV 102.4* 103.6*  PLT 48* 44*   Cardiac Enzymes:  Recent Labs  03/02/15 0430 03/02/15 0754  TROPONINI 0.04* 0.04*   BNP: Invalid input(s): POCBNP D-Dimer:  Recent Labs  03/01/15 1610  DDIMER 1.01*   Hemoglobin A1C:  Recent Labs  03/02/15 0430  HGBA1C  6.6*   Fasting Lipid Panel: No results for input(s): CHOL, HDL, LDLCALC, TRIG, CHOLHDL, LDLDIRECT in the last 72 hours. Thyroid Function Tests:  Recent Labs  03/02/15 0430  TSH 3.371   Anemia Panel: No results for input(s): VITAMINB12, FOLATE, FERRITIN, TIBC, IRON, RETICCTPCT in the last 72 hours. Coag Panel:   Lab Results  Component Value Date   INR 1.18 03/02/2015    RADIOLOGY: Dg Chest 2 View  03/01/2015   CLINICAL DATA:  Cough since yesterday. Shortness of breath with exertion. History of hypertension, asthma.  EXAM: CHEST  2 VIEW  COMPARISON:  05/19/2012  FINDINGS: Right Port-A-Cath remains in place, unchanged. Postsurgical changes in the left breast and axilla. Heart is normal size. Bibasilar opacities likely reflect atelectasis. No effusions. No acute bony abnormality. Mild rightward scoliosis in the thoracolumbar spine.  IMPRESSION: Bibasilar atelectasis.   Electronically Signed   By: Rolm Baptise M.D.   On: 03/01/2015 14:59   Nm Pulmonary Perf And Vent  03/01/2015   CLINICAL  DATA:  Shortness of Breath  EXAM: NUCLEAR MEDICINE VENTILATION - PERFUSION LUNG SCAN  TECHNIQUE: Ventilation images were obtained in multiple projections using inhaled aerosol Tc-49m DTPA. Perfusion images were obtained in multiple projections after intravenous injection of Tc-87m MAA.  RADIOPHARMACEUTICALS:  Forty mCi Technetium-60m DTPA aerosol inhalation and 6 Technetium-22m MAA IV  COMPARISON:  Chest x-ray same day  FINDINGS: Ventilation: No focal ventilation defect.  Perfusion: No wedge shaped peripheral perfusion defects to suggest acute pulmonary embolism.  IMPRESSION: Low probability for pulmonary embolus.   Electronically Signed   By: Lahoma Crocker M.D.   On: 03/01/2015 22:18   Dg Chest Port 1 View  03/04/2015   CLINICAL DATA:  CHF.  EXAM: PORTABLE CHEST - 1 VIEW  COMPARISON:  03/01/2015  FINDINGS: Right IJ Port-A-Cath unchanged. Examination demonstrates the lungs to be hypoinflated with worsening left  base opacification likely a small effusion with associated atelectasis. Possible small amount right pleural fluid. Evidence of persistent mild vascular congestion. Borderline stable cardiomegaly. Remainder of the exam is unchanged.  IMPRESSION: Worsening left base opacification likely small effusion with atelectasis. Possible small amount right pleural fluid. Persistent mild vascular congestion.   Electronically Signed   By: Marin Olp M.D.   On: 03/04/2015 10:36   Dg Chest Port 1 View  03/02/2015   CLINICAL DATA:  Acute onset of shortness of breath. Initial encounter.  EXAM: PORTABLE CHEST - 1 VIEW  COMPARISON:  Chest radiograph performed earlier today at 2:41 p.m.  FINDINGS: The lungs are mildly hypoexpanded. Vascular congestion is noted. Mildly increased interstitial markings may reflect minimal interstitial edema. There is no evidence of pleural effusion or pneumothorax.  The cardiomediastinal silhouette is borderline normal in size. A right-sided chest port is noted ending about the mid SVC. Clips are seen overlying the left axilla. No acute osseous abnormalities are seen.  IMPRESSION: Lungs mildly hypoexpanded. Vascular congestion noted. Mildly increased interstitial markings may reflect minimal interstitial edema.   Electronically Signed   By: Garald Balding M.D.   On: 03/02/2015 00:04    ASSESSMENT/PLAN: 64 year old female with long-standing history of non-Hodgkin's lymphoma, multiple rounds of chemotherapy, bone marrow transplantation, and chemotherapy as recently as 6 weeks ago. She is here in [place] visiting with family but receives her therapy at [facility] in [place]. The patient has a memory deficit. A family friend does document that she has received Adriamycin in the past as a component of her chemotherapy regimen. Other agents are not known at this time. Since being here the family has noticed dyspnea on exertion, exertional fatigue, orthopnea, and weakness. She denies  chest pain. Significant finding since admission included a chest x-ray on 03/01/2015 revealed bibasal atelectasis followed 9 hours later by evidence of pulmonary congestion after receiving IV fluid. Echocardiogram performed yesterday demonstrates mild LV enlargement, diffuse hypokinesis, EF 15-20%, with mild mitral tricuspid and aortic regurgitation. There is a small pericardial effusion.  1.  Dilated cardiomyopathy of uncertain age. Etiology is uncertain but certainly toxicity from the cumulative effect of multiple rounds of chemotherapy (which has included anthracycline's) over the years would be the leading concern. Viral cardiomyopathy, coronary disease, hypertension and tachycardia from arrhythmia seem less likely. - Diuresis as needed to improve symptoms. Beta blocker therapy and angiotensin system blockade as tolerated by blood pressure. - Obtaining information concerning chemotherapy and other treatment modalities for her non-Hodgkin's lymphoma will be important. - Will probably need heart failure service consult and close follow-up if she remains in [place] [facility].  Hypokalemia - replete to keep > 4 3.  Acute systolic CHF - she is net neg 4.3L.  Unclear if weights are accurate.  She weighed 122lbs on admit and then next day was 155lb (most likely this weight was more accurate). Now weight 135 lbs.  Continue low dose BB, IV diuretics and low dose ACE I.  Cannot titrate meds further due to soft BP.  She is persistently tachycardic which may be a sign of further decompensation.  I will ask Dr. Aundra Dubin from Advanced heart failure service to see today.  I will transfer her to [address]00 stepdown for closer monitoring.  She may need to have a trial of IV inotropes.   Sueanne Margarita, MD  03/04/2015  11:20 AM

## 2015-03-04 NOTE — Progress Notes (Signed)
Peripherally Inserted Central Catheter/Midline Placement  The IV Nurse has discussed with the patient and/or persons authorized to consent for the patient, the purpose of this procedure and the potential benefits and risks involved with this procedure.  The benefits include less needle sticks, lab draws from the catheter and patient may be discharged home with the catheter.  Risks include, but not limited to, infection, bleeding, blood clot (thrombus formation), and puncture of an artery; nerve damage and irregular heat beat.  Alternatives to this procedure were also discussed.  D/w family- husband signed consent due to pt hx of dementia.  PICC/Midline Placement Documentation  PICC / Midline Double Lumen 76/28/31 PICC Right Basilic 37 cm 0 cm (Active)  Indication for Insertion or Continuance of Line Limited venous access - need for IV therapy >5 days (PICC only);Prolonged intravenous therapies;Vasoactive infusions 03/04/2015  3:51 PM  Exposed Catheter (cm) 0 cm 03/04/2015  3:51 PM  Site Assessment Clean;Dry;Intact 03/04/2015  3:51 PM  Lumen #1 Status Flushed;Saline locked;Blood return noted 03/04/2015  3:51 PM  Lumen #2 Status Flushed;Saline locked;Blood return noted 03/04/2015  3:51 PM  Dressing Type Transparent 03/04/2015  3:51 PM  Dressing Status Clean;Dry;Intact;Antimicrobial disc in place 03/04/2015  3:51 PM  Line Care Connections checked and tightened 03/04/2015  3:51 PM  Line Adjustment (NICU/IV Team Only) No 03/04/2015  3:51 PM  Dressing Intervention New dressing 03/04/2015  3:51 PM  Dressing Change Due 03/11/15 03/04/2015  3:51 PM       Rolena Infante 03/04/2015, 3:53 PM

## 2015-03-04 NOTE — Progress Notes (Signed)
pts bp remains very soft. 80's/50's.  HR 122.  Pt sitting up eating. Notified md of pressure. picc line has been inserted.

## 2015-03-04 NOTE — Consult Note (Signed)
Consultation Note Date: 03/04/2015   Patient Name: Victoria Wood  DOB: Jul 11, 1951  MRN: [id number]  Age / Sex: 64 y.o., female   PCP: Vicenta Aly, FNP Referring Physician: Delfina Redwood, MD  Reason for Consultation: Establishing goals of care and Psychosocial/spiritual support  Palliative Care Assessment and Plan Summary of Established Goals of Care and Medical Treatment Preferences   Clinical Assessment/Narrative: Pt is a 64 yo female dx with Non-Hodgkin's lymphoma dx in 1979. She has undergone multiple rounds of chemo as well as XRT, and bone marrow transplant. She had chemo, RCHOP, approximately 6 weeks ago. She has been experiencing intermittent fatigue, dyspnea, for several weeks. Husband reports that they told PCP as well as oncologist about symptoms. She is up from [place] to visit her daughter and dyspnea, confusion worsened. She was brought to ED. CXR concerning for CHF, BNP 2628. ECHO done and pt now has EF 15-20%. This is a new finding for patient. Pt and family state in all the years affiliated with oncology service and return of disease no one has ever talked to them about advanced directives. Pt views herself as a Nurse, adult. As noted she was dx with cancer in 1979 and has always opted for treatment. Given pt's long hx of cancer treatment and no prior discussions, this initial consult focused on relationship building and support as family pursues cardiac work-up. She is being transferred to step-down unit for possible inotropic treatment. Pt is very weak, and has hypotension and options maybe limited to improve cardiac out-put at this stage. MOST form given to family to utilize as a starting point to facilitate advanced directive conversations in light of new severe CHF dx. Also family given " Hard Choices for [employer]" booklet. Palliative Team to continue to follow. As pt and family obtain more information we will continue to support and assist in decisions as pt and family  desire. Their goal is to return to [place] when stable by car, but do verbalize they are not sure if she will be able to or what they will be dealing with at that time.  Contacts/Participants in Discussion: Primary Decision Maker: Husband  HCPOA: yes  Pt has 2 daughters. [name] who lives locally [phone]; daughter [name], in [place]. Pt and her husband have a second home here in [place]:  Full Code.   Symptom Management:   Dyspnea: Targeted cardiac and pulmonary treatments  Psycho-social/Spiritual:   Support System: Family  Desire for further Chaplaincy support:no  Prognosis: < 6 months. I did not share this with pt and family at this time. It is my opinion she does meet hospice in-home criteria currently. Introudction of hospice concept premature at this point  Discharge Planning:  TBD. Pt and husband have a second home here but wish to return to [facility] when medically stable to travel by car       Chief Complaint/History of Present Illness: Pt is a 64 yo female admitted with dyspnea, confusion and found to have elevated BNP 2628, as well as CXR suggestive of CHF. Pt underwent ECHO and new dx of CHF made with EF of 15-20%.   Primary Diagnoses  Present on Admission:  . (Resolved) Acute bronchiolitis . GERD (gastroesophageal reflux disease) . HTN (hypertension) . Non-Hodgkin's lymphoma . Pancytopenia . Acute systolic heart failure . Sinus tachycardia . Dementia without behavioral disturbance  Palliative Review of Systems: Confused but currently denying dyspnea. Denies pain I have reviewed the medical record, interviewed the patient and  family, and examined the patient. The following aspects are pertinent.  Past Medical History  Diagnosis Date  . Gastric polyps     History of  . GERD (gastroesophageal reflux disease)   . Asthma   . Anemia   . Hyperlipidemia   . DM type 2 (diabetes mellitus, type 2)   . 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 Hypomagnesemia 01/13/2012  . Non Hodgkin's lymphoma     "5 times since she was 27" (03/02/2015)  . Cancer     hx of Rituxan in 07/2011  . Complication of anesthesia     has woken up during surgery   . History of blood transfusion "several"    "related to her chemo"  . Dementia     past early stage/spouse (03/02/2015)  . Depression    History   Social History  . Marital Status: Married    Spouse Name: N/A  . Number of Children: N/A  . Years of Education: N/A   Social History Main Topics  . Smoking status: Never Smoker   . Smokeless tobacco: Never Used  . Alcohol Use: 0.0 oz/week     Comment: 03/02/2015 "used to have have a couple drinks/month; none since 06/2014"  . Drug Use: No  . Sexual Activity: No   Other Topics Concern  . None   Social History Narrative   Family History  Problem Relation Age of Onset  . Hypertension Mother   . Cancer Mother     breast  . Hypertension Father   . Kidney disease Father   . Cancer Father     throat  . Cancer Maternal Grandmother     breast   Scheduled Meds: . acyclovir  800 mg Oral BID  . carvedilol  3.125 mg Oral BID WC  . donepezil  5 mg Oral Daily  . feeding supplement (ENSURE ENLIVE)  237 mL Oral BID BM  . furosemide  40 mg Intravenous BID  . insulin aspart  0-15 Units Subcutaneous TID WC  . insulin aspart  0-5 Units Subcutaneous QHS  . insulin detemir  15 Units Subcutaneous QHS  . ipratropium  0.5 mg Nebulization TID  . lisinopril  2.5 mg Oral Daily  . pantoprazole  80 mg Oral Daily  . potassium chloride  40 mEq Oral BID  . sodium chloride  10-40 mL Intracatheter Q12H  . venlafaxine XR  75 mg Oral Q breakfast   Continuous Infusions:  PRN Meds:.guaiFENesin, levalbuterol, menthol-cetylpyridinium, sodium chloride, sodium chloride Medications Prior to Admission:  Prior to Admission medications   Medication Sig Start Date End Date Taking? Authorizing Provider  acetaminophen  (TYLENOL) 500 MG tablet Take 500 mg by mouth every 6 (six) hours as needed for mild pain.   Yes Historical Provider, MD  acyclovir (ZOVIRAX) 800 MG tablet Take 800 mg by mouth Twice daily. 02/26/12  Yes Historical Provider, MD  albuterol (PROVENTIL HFA;VENTOLIN HFA) 108 (90 BASE) MCG/ACT inhaler Inhale 2 puffs into the lungs every 6 (six) hours as needed. Wheezing    Yes Historical Provider, MD  [employer] F[employer]ER GUMMIES PO Take 1 tablet by mouth daily.   Yes Historical Provider, MD  donepezil (ARICEPT) 5 MG tablet Take 5 mg by mouth daily.   Yes Historical Provider, MD  insulin detemir (LEVEMIR) 100 UNIT/ML injection Inject 15 Units into the skin at bedtime.   Yes Historical Provider, MD  Magnesium Cl-Calcium Carbonate (SLOW-MAG PO) Take 1 tablet by mouth daily.    Yes Historical Provider,  MD  MELATONIN PO Take 1 tablet by mouth at bedtime.   Yes Historical Provider, MD  methylcellulose (ARTIFICIAL TEARS) 1 % ophthalmic solution Place 1 drop into both eyes daily as needed. dryness   Yes Historical Provider, MD  omeprazole (PRILOSEC) 40 MG capsule Take 40 mg by mouth daily.   Yes Historical Provider, MD  senna (SENOKOT) 8.6 MG TABS Take 2 tablets by mouth at bedtime. Constipation  09/14/11  Yes Shanker Kristeen Mans, MD  venlafaxine (EFFEXOR-XR) 75 MG 24 hr capsule Take 75 mg by mouth daily with breakfast.    Yes Historical Provider, MD  fluticasone (FLONASE) 50 MCG/ACT nasal spray Place 2 sprays into the nose daily. 12/28/11 12/27/12  Blair Heys, PA-C  loratadine (CLARITIN) 10 MG tablet Take 10 mg by mouth daily.    Historical Provider, MD   Allergies  Allergen Reactions  . Penicillins Diarrhea  . Shellfish Allergy     migraines   CBC:    Component Value Date/Time   WBC 4.7 03/04/2015 0500   WBC 1.9* 05/21/2012 1341   HGB 9.0* 03/04/2015 0500   HGB 7.5* 05/21/2012 1341   HCT 26.1* 03/04/2015 0500   HCT 21.2* 05/21/2012 1341   PLT 44* 03/04/2015 0500   PLT 47* 05/21/2012 1341   MCV 103.6*  03/04/2015 0500   MCV 101.9* 05/21/2012 1341   NEUTROABS 3.2 03/04/2015 0500   NEUTROABS 0.9* 05/21/2012 1341   LYMPHSABS 0.7 03/04/2015 0500   LYMPHSABS 0.6* 05/21/2012 1341   MONOABS 0.8 03/04/2015 0500   MONOABS 0.3 05/21/2012 1341   EOSABS 0.0 03/04/2015 0500   EOSABS 0.0 05/21/2012 1341   BASOSABS 0.0 03/04/2015 0500   BASOSABS 0.0 05/21/2012 1341   Comprehensive Metabolic Panel:    Component Value Date/Time   NA 144 03/04/2015 0500   K 2.9* 03/04/2015 0500   CL 106 03/04/2015 0500   CO2 27 03/04/2015 0500   BUN 27* 03/04/2015 0500   CREATININE 1.14* 03/04/2015 0500   GLUCOSE 65 03/04/2015 0500   CALCIUM 9.2 03/04/2015 0500   AST 19 03/02/2015 0430   ALT 16 03/02/2015 0430   ALKPHOS 71 03/02/2015 0430   BILITOT 1.0 03/02/2015 0430   PROT 5.3* 03/02/2015 0430   ALBUMIN 3.3* 03/02/2015 0430    Physical Exam: Vital Signs: BP 93/54 mmHg  Pulse 117  Temp(Src) 98.8 F (37.1 C) (Oral)  Resp 18  Ht 5\' 7"  (1.702 m)  Wt 61.553 kg (135 lb 11.2 oz)  BMI 21.25 kg/m2  SpO2 95% SpO2: SpO2: 95 % O2 Device: O2 Device: Not Delivered O2 Flow Rate: O2 Flow Rate (L/min): 2 L/min Intake/output summary:  Intake/Output Summary (Last 24 hours) at 03/04/15 1654 Last data filed at 03/04/15 1238  Gross per 24 hour  Intake    240 ml  Output   3850 ml  Net  -3610 ml   LBM: Last BM Date: 03/03/15 Baseline Weight: Weight: 55.339 kg (122 lb) Most recent weight: Weight: 61.553 kg (135 lb 11.2 oz)  Exam Findings:  General: Cachetic older female. Pleasantly confused Resp: No work of breathing observed Musculoskeletal: MAE x 4 Neuro: Confused. Short term memory deficits present         Palliative Performance Scale: 50-60%              Additional Data Reviewed: Recent Labs     03/03/15  0425  03/04/15  0500  WBC  5.2  4.7  HGB  8.9*  9.0*  PLT  48*  44*  NA  140  144  BUN  22*  27*  CREATININE  0.99  1.14*     Time In: 1300 Time Out: 1430 Time Total: 90 min Greater  than 50%  of this time was spent counseling and coordinating care related to the above assessment and plan.  Signed by: Dory Horn, NP  Dory Horn, NP  03/04/2015, 4:54 PM  Please contact Palliative Medicine Team phone at [phone] for questions and concerns.

## 2015-03-04 NOTE — Progress Notes (Signed)
Hypoglycemic Event  CBG: 51  Treatment: D50 IV 25 mL  Symptoms: None  Follow-up CBG: [id number] CBG Result:163  Possible Reasons for Event: Unknown  Comments/MD notified:YES    Miaya Lafontant L  Remember to initiate Hypoglycemia Order Set & complete

## 2015-03-04 NOTE — Progress Notes (Signed)
CRITICAL VALUE ALERT  Critical value received:  k 2.9  Date of notification:  03/04/2015  Time of notification:  0650  Critical value read back:yes  Nurse who received alert:  Purcell Nails  MD notified (1st page): Tom calahan  Time of first page:  [phone]  MD notified (2nd page):  Time of second page:  Responding MD:  Donald Siva  Time MD responded:  [phone]

## 2015-03-05 DIAGNOSIS — F0390 Unspecified dementia without behavioral disturbance: Secondary | ICD-10-CM

## 2015-03-05 LAB — BASIC METABOLIC PANEL
Anion gap: 7 (ref 5–15)
BUN: 27 mg/dL — ABNORMAL HIGH (ref 6–20)
CO2: 29 mmol/L (ref 22–32)
CREATININE: 1.06 mg/dL — AB (ref 0.44–1.00)
Calcium: 8.5 mg/dL — ABNORMAL LOW (ref 8.9–10.3)
Chloride: 106 mmol/L (ref 101–111)
GFR calc Af Amer: >60 mL/min (ref >60)
GFR calc non Af Amer: 53 mL/min — ABNORMAL LOW (ref >60)
GLUCOSE: 58 mg/dL — AB (ref 65–99)
Potassium: 3.8 mmol/L (ref 3.5–5.1)
SODIUM: 142 mmol/L (ref 135–145)

## 2015-03-05 LAB — CBC WITH DIFFERENTIAL/PLATELET
Basophils Absolute: 0 10*3/uL (ref 0.0–0.1)
Basophils Relative: 0 % (ref 0–1)
Eosinophils Absolute: 0.1 10*3/uL (ref 0.0–0.7)
Eosinophils Relative: 2 % (ref 0–5)
HEMATOCRIT: 32.6 % — AB (ref 36.0–46.0)
Hemoglobin: 11.1 g/dL — ABNORMAL LOW (ref 12.0–15.0)
Lymphocytes Relative: 14 % (ref 12–46)
Lymphs Abs: 0.4 10*3/uL — ABNORMAL LOW (ref 0.7–4.0)
MCH: 35.5 pg — ABNORMAL HIGH (ref 26.0–34.0)
MCHC: 34 g/dL (ref 30.0–36.0)
MCV: 104.2 fL — AB (ref 78.0–100.0)
MONOS PCT: 14 % — AB (ref 3–12)
Monocytes Absolute: 0.4 10*3/uL (ref 0.1–1.0)
NEUTROS PCT: 71 % (ref 43–77)
Neutro Abs: 2 10*3/uL (ref 1.7–7.7)
PLATELETS: 35 10*3/uL — AB (ref 150–400)
RBC: 3.13 MIL/uL — ABNORMAL LOW (ref 3.87–5.11)
RDW: 19.3 % — AB (ref 11.5–15.5)
WBC: 2.8 10*3/uL — ABNORMAL LOW (ref 4.0–10.5)

## 2015-03-05 LAB — CARBOXYHEMOGLOBIN
Carboxyhemoglobin: 1.9 % — ABNORMAL HIGH (ref 0.5–1.5)
Methemoglobin: 1.1 % (ref 0.0–1.5)
O2 Saturation: 73.3 %
Total hemoglobin: 9 g/dL — ABNORMAL LOW (ref 12.0–16.0)

## 2015-03-05 LAB — GLUCOSE, CAPILLARY
GLUCOSE-CAPILLARY: 112 mg/dL — AB (ref 65–99)
Glucose-Capillary: 58 mg/dL — ABNORMAL LOW (ref 65–99)
Glucose-Capillary: 180 mg/dL — ABNORMAL HIGH (ref 65–99)
Glucose-Capillary: 179 mg/dL — ABNORMAL HIGH (ref 65–99)

## 2015-03-05 MED ORDER — INSULIN DETEMIR 100 UNIT/ML ~~LOC~~ SOLN
5.0000 [IU] | Freq: Every day | SUBCUTANEOUS | Status: DC
  Filled 2015-03-05: qty 0.05

## 2015-03-05 MED ORDER — SODIUM CHLORIDE 0.9 % IV BOLUS (SEPSIS)
250.0000 mL | Freq: Once | INTRAVENOUS | Status: AC
  Administered 2015-03-05: 250 mL via INTRAVENOUS

## 2015-03-05 MED ORDER — DIGOXIN 125 MCG PO TABS
0.1250 mg | ORAL_TABLET | Freq: Every day | ORAL | Status: DC
  Administered 2015-03-06 – 2015-03-08 (×3): 0.125 mg via ORAL
  Filled 2015-03-05 (×4): qty 1

## 2015-03-05 MED ORDER — DIGOXIN 250 MCG PO TABS
0.2500 mg | ORAL_TABLET | Freq: Once | ORAL | Status: AC
  Administered 2015-03-05: 0.25 mg via ORAL
  Filled 2015-03-05: qty 1

## 2015-03-05 MED ORDER — FUROSEMIDE 10 MG/ML IJ SOLN
40.0000 mg | Freq: Three times a day (TID) | INTRAMUSCULAR | Status: DC
  Administered 2015-03-05: 40 mg via INTRAVENOUS
  Filled 2015-03-05 (×3): qty 4

## 2015-03-05 MED ORDER — POTASSIUM CHLORIDE CRYS ER 20 MEQ PO TBCR
40.0000 meq | EXTENDED_RELEASE_TABLET | Freq: Once | ORAL | Status: AC
  Administered 2015-03-05: 40 meq via ORAL

## 2015-03-05 NOTE — Progress Notes (Signed)
Stopped by to see pt and family. Pt reports having a good day. Met with cardiology. Husband and daughter [name] at bedside. No questions at this point regarding MOST^ form or advanced directives. Visit focused on relationship building as pt and family come to terms with new CHF dx. Palliative Care will continue to be a presence during her stay here and available if family shifts their focus to talking about goals of care moving forward. Thank you,  Romona Curls, ANP-ACHPN

## 2015-03-05 NOTE — Progress Notes (Signed)
[facility] PROGRESS NOTE  Marguerette Sheller OKH:[id number] DOB: 1950-12-13 DOA: 03/01/2015 PCP: Vicenta Aly, FNP  Summary 64 year old white female with history of non-Hodgkin's lymphoma requiring multiple rounds of chemotherapy, bone marrow transplant, most of her recent therapy in [place], presents with dyspnea, echocardiogram showing new  Severe systolic heart failure. Cardiology consult did and patient was transferred to stepdown on 6/25 for PICC line and inotropic therapy. Palliative medicine also consulted.  Assessment/Plan:  Principal Problem:   Acute systolic heart failure: new diagnosis. EF 15-20%. Might be chemo related. Blood pressures have been low all night with systolics in the 95V. Nurses have held Lasix, Coreg, ACE inhibitor. PICC line placed not yet evaluated by heart failure team. I suspect prognosis is poor, given the severity of her cardiomyopathy, lymphoma, dementia, poor functional status. I shared this with the patient who seemed to be in a lucid moment as well as with her family. Husband and second daughter arrived from [place] yesterday and it discussed my concerns regarding poor prognosis yesterday afternoon.  Await heart failure team recommendations.  Palliative care team following.  Will DC Coreg and lisinopril due to hypotension. Active Problems:   Non-Hodgkin's lymphoma: per husband, on Rituxan and Bendamustine in [place]   GERD (gastroesophageal reflux disease)   DM type 2 (diabetes mellitus, type 2): hypoglycemia this morning. Will decrease Lantus and DC nighttime coverage.   H/o Autologous bone marrow transplantation status   Pancytopenia: secondary to lymphoma and came a   Sinus tachycardia: Hypotension limiting titration of beta blocker   Dementia without behavioral disturbance. Per daughter "Alzheimers" Hypokalemia: Repleted. Magnesium normal Hypotension: See above  Code Status:  full Family Communication:  Daughter at bedside Disposition Plan: ?     HPI/Subjective: Complains of feeling tired.  Objective: Filed Vitals:   03/05/15 0700  BP: 88/60  Pulse: 116  Temp:   Resp: 24    Intake/Output Summary (Last 24 hours) at 03/05/15 0828 Last data filed at 03/04/15 1800  Gross per 24 hour  Intake    480 ml  Output   1600 ml  Net  -1120 ml   Filed Weights   03/03/15 0350 03/04/15 0416 03/05/15 0500  Weight: 65.3 kg (143 lb 15.4 oz) 61.553 kg (135 lb 11.2 oz) 61.6 kg (135 lb 12.9 oz)   Tele: ST  Exam:   General:  Sleeping. Arousable.  Cardiovascular: fast, regular  Respiratory: bilateral rales  Abdomen: S, NT, ND  Ext: trace edema  Basic Metabolic Panel:  Recent Labs Lab 03/01/15 1610 03/01/15 1632 03/02/15 0430 03/03/15 0425 03/03/15 1914 03/04/15 0500 03/05/15 0500  NA 139 139 141 140  --  144 142  K 3.7 3.7 3.6 3.8  --  2.9* 3.8  CL 110 106 112* 108  --  106 106  CO2 21*  --  19* 24  --  27 29  GLUCOSE 138* 137* 325* 171*  --  65 58*  BUN 22* 25* 17 22*  --  27* 27*  CREATININE 0.92 0.80 0.95 0.99  --  1.14* 1.06*  CALCIUM 8.8*  --  8.6* 9.0  --  9.2 8.5*  MG  --   --   --   --  1.8  --   --    Liver Function Tests:  Recent Labs Lab 03/01/15 1610 03/02/15 0430  AST 19 19  ALT 16 16  ALKPHOS 77 71  BILITOT 0.7 1.0  PROT 5.2* 5.3*  ALBUMIN 3.3* 3.3*   No results for input(s): LIPASE, AMYLASE  in the last 168 hours. No results for input(s): AMMONIA in the last 168 hours. CBC:  Recent Labs Lab 03/01/15 1610 03/01/15 1632 03/02/15 0430 03/03/15 0425 03/04/15 0500 03/05/15 0500  WBC 2.9*  --  2.4* 5.2 4.7 2.8*  NEUTROABS 1.7  --  2.2 3.9 3.2 2.0  HGB 9.0* 9.2* 9.0* 8.9* 9.0* 11.1*  HCT 25.7* 27.0* 26.5* 25.7* 26.1* 32.6*  MCV 101.6*  --  103.9* 102.4* 103.6* 104.2*  PLT 55*  --  41* 48* 44* 35*   Cardiac Enzymes:  Recent Labs Lab 03/02/15 0430 03/02/15 0754  TROPONINI 0.04* 0.04*   BNP (last 3 results)  Recent Labs  03/01/15 1610 03/04/15 1005  BNP 2628.2* >4500.0*     ProBNP (last 3 results) No results for input(s): PROBNP in the last 8760 hours.  CBG:  Recent Labs Lab 03/04/15 0911 03/04/15 1218 03/04/15 1803 03/04/15 2128 03/05/15 0645  GLUCAP 163* 117* 194* 119* 58*    Recent Results (from the past 240 hour(s))  Blood Culture (routine x 2)     Status: None (Preliminary result)   Collection Time: 03/01/15  5:07 PM  Result Value Ref Range Status   Specimen Description BLOOD RIGHT WRIST  Final   Special Requests BOTTLES DRAWN AEROBIC ONLY 3CC  Final   Culture NO GROWTH 3 DAYS  Final   Report Status PENDING  Incomplete  Blood Culture (routine x 2)     Status: None (Preliminary result)   Collection Time: 03/01/15  5:15 PM  Result Value Ref Range Status   Specimen Description BLOOD LEFT ARM  Final   Special Requests BOTTLES DRAWN AEROBIC AND ANAEROBIC 5CC  Final   Culture NO GROWTH 3 DAYS  Final   Report Status PENDING  Incomplete  Urine culture     Status: None   Collection Time: 03/01/15  7:25 PM  Result Value Ref Range Status   Specimen Description URINE, CLEAN CATCH  Final   Special Requests NONE  Final   Culture 2,000 COLONIES/mL INSIGNIFICANT GROWTH  Final   Report Status 03/03/2015 FINAL  Final  MRSA PCR Screening     Status: None   Collection Time: 03/04/15  6:40 PM  Result Value Ref Range Status   MRSA by PCR NEGATIVE NEGATIVE Final    Comment:        The GeneXpert MRSA Assay (FDA approved for NASAL specimens only), is one component of a comprehensive MRSA colonization surveillance program. It is not intended to diagnose MRSA infection nor to guide or monitor treatment for MRSA infections.      Studies: Dg Chest Port 1 View  03/04/2015   CLINICAL DATA:  CHF.  EXAM: PORTABLE CHEST - 1 VIEW  COMPARISON:  03/01/2015  FINDINGS: Right IJ Port-A-Cath unchanged. Examination demonstrates the lungs to be hypoinflated with worsening left base opacification likely a small effusion with associated atelectasis. Possible small  amount right pleural fluid. Evidence of persistent mild vascular congestion. Borderline stable cardiomegaly. Remainder of the exam is unchanged.  IMPRESSION: Worsening left base opacification likely small effusion with atelectasis. Possible small amount right pleural fluid. Persistent mild vascular congestion.   Electronically Signed   By: Marin Olp M.D.   On: 03/04/2015 10:36   Echo Left ventricle: The cavity size was mildly dilated. Systolic function was normal. The estimated ejection fraction was in the range of 15% to 20%. Severe diffuse hypokinesis. Although no diagnostic regional wall motion abnormality was identified, this possibility cannot be completely excluded on the basis  of this study. - Aortic valve: There was mild regurgitation. - Mitral valve: There was moderate regurgitation. - Left atrium: The atrium was moderately dilated. - Right atrium: The atrium was moderately dilated. - Tricuspid valve: There was moderate regurgitation. - Pulmonary arteries: Systolic pressure was moderately increased. - Pericardium, extracardiac: A small pericardial effusion was identified not hemodynamically signiticant.. Difficult to tell if there is a loculated posterior effusion or if it is pleural. There was a left pleural effusion.  Scheduled Meds: . acyclovir  800 mg Oral BID  . donepezil  5 mg Oral Daily  . feeding supplement (ENSURE ENLIVE)  237 mL Oral BID BM  . furosemide  40 mg Intravenous BID  . insulin aspart  0-15 Units Subcutaneous TID WC  . insulin aspart  0-5 Units Subcutaneous QHS  . insulin detemir  15 Units Subcutaneous QHS  . ipratropium  0.5 mg Nebulization TID  . pantoprazole  80 mg Oral Daily  . potassium chloride  40 mEq Oral BID  . sodium chloride  10-40 mL Intracatheter Q12H  . venlafaxine XR  75 mg Oral Q breakfast   Continuous Infusions:   Time spent: 35 minutes  [facility]  [email or web address], password  [facility] 03/05/2015, 8:28 AM  LOS: 4 days

## 2015-03-05 NOTE — Progress Notes (Addendum)
Patient ID: Victoria Wood, female   DOB: 03-17-1951, 64 y.o.   MRN: [id number]    64 yo with long history of non-Hodgkin's lymphoma (since age 64) with multiple rounds of chemo and history of autologous bone marrow transplant was admitted with CHF.  She lives in [place] now and has her oncology care down there.  She was up visiting her daughter.  She has no history of cardiac problems.  She was seen by a cardiologist in 12/15 prior to R-CHOP (doxorubicin-containing chemo).  She had R-CHOP and then Rituxan/bendamustine more recently, which has been completed.  She had a PET scan not long ago, and she was told that her cancer is non-active.  Husband says that she has been told when she goes for infusions, etc, that her HR is high and her BP is low for months now.  She has had exertional dyspnea as well for several months.  She was admitted after her daughter noted her wheezing and she was taken to a primary MD and found to be hypoxic.  Also of note, she has had signs of dementia that have been progressive for 2-3 years.  She is on Aricept.   Echo this admission showed EF 15-20%, diffuse hypokinesis, moderate MR, mild AI.   Currently she is drowsy but arouses and is appropriate.  HR in 100s-110s (sinus tachycardia) with SBP in 80s.  She denies dyspnea at rest or lightheadedness.  She has a PICC line in, CVP is 11.  Co-ox has not been sent.   History of thrombocytopenia, plts 35K today.   Scheduled Meds: . acyclovir  800 mg Oral BID  . donepezil  5 mg Oral Daily  . feeding supplement (ENSURE ENLIVE)  237 mL Oral BID BM  . furosemide  40 mg Intravenous BID  . insulin aspart  0-15 Units Subcutaneous TID WC  . insulin aspart  0-5 Units Subcutaneous QHS  . insulin detemir  15 Units Subcutaneous QHS  . ipratropium  0.5 mg Nebulization TID  . pantoprazole  80 mg Oral Daily  . potassium chloride  40 mEq Oral BID  . sodium chloride  10-40 mL Intracatheter Q12H  . venlafaxine XR  75 mg Oral Q breakfast    Continuous Infusions:  PRN Meds:.guaiFENesin, levalbuterol, menthol-cetylpyridinium, sodium chloride, sodium chloride   Filed Vitals:   03/05/15 0600 03/05/15 0700 03/05/15 0800 03/05/15 0803  BP: 90/55 88/60 83/56    Pulse: 113 116 111   Temp:   98.4 F (36.9 C)   TempSrc:   Oral   Resp: 27 24 18    Height:      Weight:      SpO2: 97% 98% 90% 96%    Intake/Output Summary (Last 24 hours) at 03/05/15 1016 Last data filed at 03/04/15 1800  Gross per 24 hour  Intake    240 ml  Output    900 ml  Net   -660 ml    LABS: Basic Metabolic Panel:  Recent Labs  03/03/15 1914 03/04/15 0500 03/05/15 0500  NA  --  144 142  K  --  2.9* 3.8  CL  --  106 106  CO2  --  27 29  GLUCOSE  --  65 58*  BUN  --  27* 27*  CREATININE  --  1.14* 1.06*  CALCIUM  --  9.2 8.5*  MG 1.8  --   --    Liver Function Tests: No results for input(s): AST, ALT, ALKPHOS, BILITOT, PROT, ALBUMIN in the last  72 hours. No results for input(s): LIPASE, AMYLASE in the last 72 hours. CBC:  Recent Labs  03/04/15 0500 03/05/15 0500  WBC 4.7 2.8*  NEUTROABS 3.2 2.0  HGB 9.0* 11.1*  HCT 26.1* 32.6*  MCV 103.6* 104.2*  PLT 44* 35*   Cardiac Enzymes: No results for input(s): CKTOTAL, CKMB, CKMBINDEX, TROPONINI in the last 72 hours. BNP: Invalid input(s): POCBNP D-Dimer: No results for input(s): DDIMER in the last 72 hours. Hemoglobin A1C: No results for input(s): HGBA1C in the last 72 hours. Fasting Lipid Panel: No results for input(s): CHOL, HDL, LDLCALC, TRIG, CHOLHDL, LDLDIRECT in the last 72 hours. Thyroid Function Tests: No results for input(s): TSH, T4TOTAL, T3FREE, THYROIDAB in the last 72 hours.  Invalid input(s): FREET3 Anemia Panel: No results for input(s): VITAMINB12, FOLATE, FERRITIN, TIBC, IRON, RETICCTPCT in the last 72 hours.  RADIOLOGY: Dg Chest 2 View  03/01/2015   CLINICAL DATA:  Cough since yesterday. Shortness of breath with exertion. History of hypertension, asthma.   EXAM: CHEST  2 VIEW  COMPARISON:  05/19/2012  FINDINGS: Right Port-A-Cath remains in place, unchanged. Postsurgical changes in the left breast and axilla. Heart is normal size. Bibasilar opacities likely reflect atelectasis. No effusions. No acute bony abnormality. Mild rightward scoliosis in the thoracolumbar spine.  IMPRESSION: Bibasilar atelectasis.   Electronically Signed   By: Rolm Baptise M.D.   On: 03/01/2015 14:59   Nm Pulmonary Perf And Vent  03/01/2015   CLINICAL DATA:  Shortness of Breath  EXAM: NUCLEAR MEDICINE VENTILATION - PERFUSION LUNG SCAN  TECHNIQUE: Ventilation images were obtained in multiple projections using inhaled aerosol Tc-26m DTPA. Perfusion images were obtained in multiple projections after intravenous injection of Tc-75m MAA.  RADIOPHARMACEUTICALS:  Forty mCi Technetium-78m DTPA aerosol inhalation and 6 Technetium-35m MAA IV  COMPARISON:  Chest x-ray same day  FINDINGS: Ventilation: No focal ventilation defect.  Perfusion: No wedge shaped peripheral perfusion defects to suggest acute pulmonary embolism.  IMPRESSION: Low probability for pulmonary embolus.   Electronically Signed   By: Lahoma Crocker M.D.   On: 03/01/2015 22:18   Dg Chest Port 1 View  03/04/2015   CLINICAL DATA:  CHF.  EXAM: PORTABLE CHEST - 1 VIEW  COMPARISON:  03/01/2015  FINDINGS: Right IJ Port-A-Cath unchanged. Examination demonstrates the lungs to be hypoinflated with worsening left base opacification likely a small effusion with associated atelectasis. Possible small amount right pleural fluid. Evidence of persistent mild vascular congestion. Borderline stable cardiomegaly. Remainder of the exam is unchanged.  IMPRESSION: Worsening left base opacification likely small effusion with atelectasis. Possible small amount right pleural fluid. Persistent mild vascular congestion.   Electronically Signed   By: Marin Olp M.D.   On: 03/04/2015 10:36   Dg Chest Port 1 View  03/02/2015   CLINICAL DATA:  Acute onset of  shortness of breath. Initial encounter.  EXAM: PORTABLE CHEST - 1 VIEW  COMPARISON:  Chest radiograph performed earlier today at 2:41 p.m.  FINDINGS: The lungs are mildly hypoexpanded. Vascular congestion is noted. Mildly increased interstitial markings may reflect minimal interstitial edema. There is no evidence of pleural effusion or pneumothorax.  The cardiomediastinal silhouette is borderline normal in size. A right-sided chest port is noted ending about the mid SVC. Clips are seen overlying the left axilla. No acute osseous abnormalities are seen.  IMPRESSION: Lungs mildly hypoexpanded. Vascular congestion noted. Mildly increased interstitial markings may reflect minimal interstitial edema.   Electronically Signed   By: Garald Balding M.D.   On: 03/02/2015  00:04    PHYSICAL EXAM General: NAD, drowsy Neck: JVP 10 cm, no thyromegaly or thyroid nodule.  Lungs: Decreased breath sounds at bases bilaterally. CV: Lateral PMI.  Heart mildly tachy, regular S1/S2, +S3, 2/6 HSM apex.  No peripheral edema.   Abdomen: Soft, nontender, no hepatosplenomegaly, no distention.  Neurologic: Drowsy but awakens and oriented.   Psych: Normal affect. Extremities: No clubbing or cyanosis.   TELEMETRY: Reviewed telemetry pt in sinus tachy in 100s-110s  ASSESSMENT AND PLAN: 64 yo with history of NHL, dementia, and thrombocytopenia was admitted with acute systolic CHF.  EF 15-20%, no prior history of heart problems.  No chest pain but she has been progressively dyspneic with tachycardia and low blood pressure since she had R-CHOP chemotherapy this winter (containing doxorubicin).  1. Acute systolic CHF: EF 02-72% by echo with moderate MR.  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  Symptoms have been progressive for several months and HR has been high/BP low for a while as well.  Currently, she is volume overloaded on exam (but not markedly) with CVP 11.  I am concerned for low output heart failure given  tachycardia, relative hypotension.  Some of her current drowsiness may be related to low output as well.  - She has PICC in place.  Will send co-ox off line now.  If this suggests low output, will begin milrinone gtt.   - Continue IV Lasix but hold until we decided whether or not to start milrinone.  - Hold Coreg with suspected low output and hold ACEI with low BP.  - Platelets 35K, so would hold off on RHC/Swan for now.  She has PICC in place, will make use of this for monitoring.  - We had a long discussion about what to expect here.  If she has low output failure in the setting of doxorubicin cardiac toxicity, prognosis is poor.  She is not a transplant candidate.  With her dementia and NHL as well as questionable functional status, I do not think that she would be a good LVAD candidate.  Options at this point really would be hospice versus home milrinone infusion. She would be a poor candidate for mechanical support (Impella, IABP) with low platelets and no definite endpoint.  2. NHL: Completed most recent chemo regimen.  Per husband, had PET scan showing that disease is "non-active."   3. Dementia: x several years.  She is on Aricept.  She is drowsy but awakens and is appropriate.  Could some of this be related to low output? 4. Thrombocytopenia: Chronic, now down to 35K.    45 minutes critical care time.   Loralie Champagne 03/05/2015 10:34 AM  Surprisingly, co-ox is 73%.  I will add digoxin and proceed with diuresis. Continue to follow co-ox, repeat in am.   Loralie Champagne 03/05/2015 12:46 PM

## 2015-03-06 DIAGNOSIS — Z515 Encounter for palliative care: Secondary | ICD-10-CM

## 2015-03-06 LAB — CBC
HCT: 28.7 % — ABNORMAL LOW (ref 36.0–46.0)
Hemoglobin: 9.7 g/dL — ABNORMAL LOW (ref 12.0–15.0)
MCH: 35.5 pg — AB (ref 26.0–34.0)
MCHC: 33.8 g/dL (ref 30.0–36.0)
MCV: 105.1 fL — AB (ref 78.0–100.0)
PLATELETS: 47 10*3/uL — AB (ref 150–400)
RBC: 2.73 MIL/uL — ABNORMAL LOW (ref 3.87–5.11)
RDW: 19.1 % — AB (ref 11.5–15.5)
WBC: 3.3 10*3/uL — ABNORMAL LOW (ref 4.0–10.5)

## 2015-03-06 LAB — CARBOXYHEMOGLOBIN
Carboxyhemoglobin: 1.8 % — ABNORMAL HIGH (ref 0.5–1.5)
Carboxyhemoglobin: 2.1 % — ABNORMAL HIGH (ref 0.5–1.5)
METHEMOGLOBIN: 1.4 % (ref 0.0–1.5)
METHEMOGLOBIN: 1.2 % (ref 0.0–1.5)
O2 SAT: 57.5 %
O2 SAT: 71.1 %
TOTAL HEMOGLOBIN: 10.2 g/dL — AB (ref 12.0–16.0)
TOTAL HEMOGLOBIN: 10.1 g/dL — AB (ref 12.0–16.0)

## 2015-03-06 LAB — BASIC METABOLIC PANEL
Anion gap: 7 (ref 5–15)
BUN: 21 mg/dL — ABNORMAL HIGH (ref 6–20)
CHLORIDE: 104 mmol/L (ref 101–111)
CO2: 29 mmol/L (ref 22–32)
CREATININE: 1.09 mg/dL — AB (ref 0.44–1.00)
Calcium: 8.8 mg/dL — ABNORMAL LOW (ref 8.9–10.3)
GFR calc non Af Amer: 52 mL/min — ABNORMAL LOW (ref >60)
GFR, EST AFRICAN AMERICAN: 60 mL/min — AB (ref >60)
Glucose, Bld: 172 mg/dL — ABNORMAL HIGH (ref 65–99)
POTASSIUM: 5 mmol/L (ref 3.5–5.1)
Sodium: 140 mmol/L (ref 135–145)

## 2015-03-06 LAB — CULTURE, BLOOD (ROUTINE X 2)
CULTURE: NO GROWTH
Culture: NO GROWTH

## 2015-03-06 LAB — GLUCOSE, CAPILLARY
GLUCOSE-CAPILLARY: 148 mg/dL — AB (ref 65–99)
GLUCOSE-CAPILLARY: 219 mg/dL — AB (ref 65–99)
Glucose-Capillary: 105 mg/dL — ABNORMAL HIGH (ref 65–99)
Glucose-Capillary: 321 mg/dL — ABNORMAL HIGH (ref 65–99)

## 2015-03-06 MED ORDER — POTASSIUM CHLORIDE CRYS ER 20 MEQ PO TBCR: 20.0000 meq | EXTENDED_RELEASE_TABLET | Freq: Every day | ORAL | Status: DC

## 2015-03-06 MED ORDER — INSULIN DETEMIR 100 UNIT/ML ~~LOC~~ SOLN
8.0000 [IU] | Freq: Every day | SUBCUTANEOUS | Status: DC
  Administered 2015-03-06 – 2015-03-10 (×5): 8 [IU] via SUBCUTANEOUS
  Filled 2015-03-06 (×6): qty 0.08

## 2015-03-06 MED ORDER — MILRINONE IN DEXTROSE 20 MG/100ML IV SOLN
0.1250 ug/kg/min | INTRAVENOUS | Status: DC
  Filled 2015-03-06: qty 100

## 2015-03-06 MED ORDER — FUROSEMIDE 10 MG/ML IJ SOLN
40.0000 mg | Freq: Once | INTRAMUSCULAR | Status: AC
  Administered 2015-03-06: 40 mg via INTRAVENOUS

## 2015-03-06 NOTE — Progress Notes (Signed)
Physical Therapy Treatment Patient Details Name: Victoria Wood MRN: [id number] DOB: 1951/04/04 Today's Date: 04/01/15    History of Present Illness 64 y.o. female admitted with Acute bronchiolitis, possible acute CHF, and pancytopenia. PMHx of non-Hodgkin lymphoma, pancytopenia, hypertension, GERD, diabetes mellitus type 2, autologous bone marrow transplant history.    PT Comments    Pt pleasant and eager to mobilize stating she didn't get up over the weekend. Pt with HR 117 at rest with rise to 137 with activity but fluctuant 120-137 with gait. Pt continues to have cognitive deficits impairing mobility and safety but greatly increased activity tolerance and mobility today. Will continue to follow.   Follow Up Recommendations  Home health PT;Supervision/Assistance - 24 hour     Equipment Recommendations       Recommendations for Other Services       Precautions / Restrictions Precautions Precautions: Fall Precaution Comments: monitor HR    Mobility  Bed Mobility Overal bed mobility: Modified Independent                Transfers Overall transfer level: Needs assistance   Transfers: Sit to/from Stand Sit to Stand: Min guard         General transfer comment: cues for safety, assist to manage gowns for toileting  Ambulation/Gait Ambulation/Gait assistance: Min guard Ambulation Distance (Feet): 400 Feet Assistive device: 4-wheeled walker Gait Pattern/deviations: Step-through pattern;Decreased stride length   Gait velocity interpretation: Below normal speed for age/gender General Gait Details: cues for direction and safety   Stairs            Wheelchair Mobility    Modified Rankin (Stroke Patients Only)       Balance Overall balance assessment: Needs assistance   Sitting balance-Leahy Scale: Good       Standing balance-Leahy Scale: Fair                      Cognition Arousal/Alertness: Awake/alert Behavior During Therapy:  Impulsive Overall Cognitive Status: Impaired/Different from baseline Area of Impairment: Orientation;Problem solving Orientation Level: Disoriented to;Time   Memory: Decreased short-term memory       Problem Solving: Slow processing;Requires verbal cues General Comments: pt cued not to stand yet but impulsive and standing for toieting, decreased STM unable to recall instructions    Exercises General Exercises - Lower Extremity Long Arc Quad: AROM;Both;20 reps;Seated Hip ABduction/ADduction: AROM;Seated;Both;20 reps Hip Flexion/Marching: AROM;Seated;Both;20 reps    General Comments        Pertinent Vitals/Pain Pain Assessment: No/denies pain    Home Living                      Prior Function            PT Goals (current goals can now be found in the care plan section) Progress towards PT goals: Progressing toward goals    Frequency       PT Plan Current plan remains appropriate    Co-evaluation             End of Session   Activity Tolerance: Patient tolerated treatment well Patient left: in chair;with call bell/phone within reach;with chair alarm set     Time: 5621-3086 PT Time Calculation (min) (ACUTE ONLY): 31 min  Charges:  $Gait Training: 8-22 mins $Therapeutic Exercise: 8-22 mins                    G Codes:      Melford Aase Apr 01, 2015,  9:[facility] [place], [place]

## 2015-03-06 NOTE — Progress Notes (Signed)
Patient ID: Victoria Wood, female   DOB: 1951-06-26, 64 y.o.   MRN: [id number]    64 yo with long history of non-Hodgkin's lymphoma (since age 19) with multiple rounds of chemo and history of autologous bone marrow transplant was admitted with CHF.  She lives in [place] now and has her oncology care down there.  She was up visiting her daughter.  She has no history of cardiac problems.  She was seen by a cardiologist in 12/15 prior to R-CHOP (doxorubicin-containing chemo).  She had R-CHOP and then Rituxan/bendamustine more recently, which has been completed.  She had a PET scan not long ago, and she was told that her cancer is non-active.  Husband says that she has been told when she goes for infusions, etc, that her HR is high and her BP is low for months now.  She has had exertional dyspnea as well for several months.  She was admitted after her daughter noted her wheezing and she was taken to a primary MD and found to be hypoxic.  Also of note, she has had signs of dementia that have been progressive for 2-3 years.  She is on Aricept.   Echo this admission showed EF 15-20%, diffuse hypokinesis, moderate MR, mild AI.   Currently she is drowsy but arouses and is appropriate.  HR in 100s-110s (sinus tachycardia) with SBP in 90s currently.  She denies dyspnea at rest or lightheadedness.  She has a PICC line in, CVP is 8-9 today.  She got IV Lasix yesterday once with weight down 2 lbs.  Co-ox 73% > 58%.   History of thrombocytopenia, plts 35K > 47K   Scheduled Meds: . acyclovir  800 mg Oral BID  . digoxin  0.125 mg Oral Daily  . donepezil  5 mg Oral Daily  . feeding supplement (ENSURE ENLIVE)  237 mL Oral BID BM  . furosemide  40 mg Intravenous Once  . insulin aspart  0-15 Units Subcutaneous TID WC  . insulin detemir  5 Units Subcutaneous QHS  . ipratropium  0.5 mg Nebulization TID  . pantoprazole  80 mg Oral Daily  . sodium chloride  10-40 mL Intracatheter Q12H  . venlafaxine XR  75 mg Oral Q  breakfast   Continuous Infusions:  PRN Meds:.guaiFENesin, levalbuterol, menthol-cetylpyridinium, sodium chloride, sodium chloride   Filed Vitals:   03/06/15 0300 03/06/15 0400 03/06/15 0500 03/06/15 0506  BP: 104/67 83/53 101/66 101/66  Pulse: 122 111 116 117  Temp:    97.4 F (36.3 C)  TempSrc:    Oral  Resp: 11 19 18 18   Height:      Weight:   133 lb 6.1 oz (60.5 kg)   SpO2: 100% 96% 100% 100%    Intake/Output Summary (Last 24 hours) at 03/06/15 0755 Last data filed at 03/06/15 0240  Gross per 24 hour  Intake    210 ml  Output   1850 ml  Net  -1640 ml    LABS: Basic Metabolic Panel:  Recent Labs  03/03/15 1914  03/05/15 0500 03/06/15 0514  NA  --   < > 142 140  K  --   < > 3.8 5.0  CL  --   < > 106 104  CO2  --   < > 29 29  GLUCOSE  --   < > 58* 172*  BUN  --   < > 27* 21*  CREATININE  --   < > 1.06* 1.09*  CALCIUM  --   < >  8.5* 8.8*  MG 1.8  --   --   --   < > = values in this interval not displayed. Liver Function Tests: No results for input(s): AST, ALT, ALKPHOS, BILITOT, PROT, ALBUMIN in the last 72 hours. No results for input(s): LIPASE, AMYLASE in the last 72 hours. CBC:  Recent Labs  03/04/15 0500 03/05/15 0500 03/06/15 0514  WBC 4.7 2.8* 3.3*  NEUTROABS 3.2 2.0  --   HGB 9.0* 11.1* 9.7*  HCT 26.1* 32.6* 28.7*  MCV 103.6* 104.2* 105.1*  PLT 44* 35* 47*   Cardiac Enzymes: No results for input(s): CKTOTAL, CKMB, CKMBINDEX, TROPONINI in the last 72 hours. BNP: Invalid input(s): POCBNP D-Dimer: No results for input(s): DDIMER in the last 72 hours. Hemoglobin A1C: No results for input(s): HGBA1C in the last 72 hours. Fasting Lipid Panel: No results for input(s): CHOL, HDL, LDLCALC, TRIG, CHOLHDL, LDLDIRECT in the last 72 hours. Thyroid Function Tests: No results for input(s): TSH, T4TOTAL, T3FREE, THYROIDAB in the last 72 hours.  Invalid input(s): FREET3 Anemia Panel: No results for input(s): VITAMINB12, FOLATE, FERRITIN, TIBC, IRON,  RETICCTPCT in the last 72 hours.  RADIOLOGY: Dg Chest 2 View  03/01/2015   CLINICAL DATA:  Cough since yesterday. Shortness of breath with exertion. History of hypertension, asthma.  EXAM: CHEST  2 VIEW  COMPARISON:  05/19/2012  FINDINGS: Right Port-A-Cath remains in place, unchanged. Postsurgical changes in the left breast and axilla. Heart is normal size. Bibasilar opacities likely reflect atelectasis. No effusions. No acute bony abnormality. Mild rightward scoliosis in the thoracolumbar spine.  IMPRESSION: Bibasilar atelectasis.   Electronically Signed   By: Rolm Baptise M.D.   On: 03/01/2015 14:59   Nm Pulmonary Perf And Vent  03/01/2015   CLINICAL DATA:  Shortness of Breath  EXAM: NUCLEAR MEDICINE VENTILATION - PERFUSION LUNG SCAN  TECHNIQUE: Ventilation images were obtained in multiple projections using inhaled aerosol Tc-33m DTPA. Perfusion images were obtained in multiple projections after intravenous injection of Tc-58m MAA.  RADIOPHARMACEUTICALS:  Forty mCi Technetium-60m DTPA aerosol inhalation and 6 Technetium-23m MAA IV  COMPARISON:  Chest x-ray same day  FINDINGS: Ventilation: No focal ventilation defect.  Perfusion: No wedge shaped peripheral perfusion defects to suggest acute pulmonary embolism.  IMPRESSION: Low probability for pulmonary embolus.   Electronically Signed   By: Lahoma Crocker M.D.   On: 03/01/2015 22:18   Dg Chest Port 1 View  03/04/2015   CLINICAL DATA:  CHF.  EXAM: PORTABLE CHEST - 1 VIEW  COMPARISON:  03/01/2015  FINDINGS: Right IJ Port-A-Cath unchanged. Examination demonstrates the lungs to be hypoinflated with worsening left base opacification likely a small effusion with associated atelectasis. Possible small amount right pleural fluid. Evidence of persistent mild vascular congestion. Borderline stable cardiomegaly. Remainder of the exam is unchanged.  IMPRESSION: Worsening left base opacification likely small effusion with atelectasis. Possible small amount right pleural  fluid. Persistent mild vascular congestion.   Electronically Signed   By: Marin Olp M.D.   On: 03/04/2015 10:36   Dg Chest Port 1 View  03/02/2015   CLINICAL DATA:  Acute onset of shortness of breath. Initial encounter.  EXAM: PORTABLE CHEST - 1 VIEW  COMPARISON:  Chest radiograph performed earlier today at 2:41 p.m.  FINDINGS: The lungs are mildly hypoexpanded. Vascular congestion is noted. Mildly increased interstitial markings may reflect minimal interstitial edema. There is no evidence of pleural effusion or pneumothorax.  The cardiomediastinal silhouette is borderline normal in size. A right-sided chest port is noted  ending about the mid SVC. Clips are seen overlying the left axilla. No acute osseous abnormalities are seen.  IMPRESSION: Lungs mildly hypoexpanded. Vascular congestion noted. Mildly increased interstitial markings may reflect minimal interstitial edema.   Electronically Signed   By: Garald Balding M.D.   On: 03/02/2015 00:04    PHYSICAL EXAM General: NAD, drowsy Neck: JVP 8-9 cm, no thyromegaly or thyroid nodule.  Lungs: Decreased breath sounds at bases bilaterally. CV: Lateral PMI.  Heart mildly tachy, regular S1/S2, +S3, 2/6 HSM apex.  No peripheral edema.   Abdomen: Soft, nontender, no hepatosplenomegaly, no distention.  Neurologic: Drowsy but awakens and oriented.   Psych: Normal affect. Extremities: No clubbing or cyanosis.   TELEMETRY: Reviewed telemetry pt in sinus tachy in 100s-110s  ASSESSMENT AND PLAN: 64 yo with history of NHL, dementia, and thrombocytopenia was admitted with acute systolic CHF.  EF 15-20%, no prior history of heart problems.  No chest pain but she has been progressively dyspneic with tachycardia and low blood pressure since she had R-CHOP chemotherapy this winter (containing doxorubicin).  1. Acute systolic CHF: EF 27-07% by echo with moderate MR.  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  Symptoms have been progressive for  several months and HR has been high/BP low for a while as well.  Volume status is improved and weight is down with IV Lasix, now CVP 8-9.  Co-ox 58% today (73% yesterday) which is low but not markedly so.  She is on digoxin but no other heart failure meds due to low blood pressure.   - Would hold off on IV milrinone for now, repeat co-ox at noon.  If co-ox lower at that point, would consider use of low dose milrinone 0.125.  Continue digoxin.   - Lasix 40 mg IV x 1 today, CVP only mildly elevated (possibly to po tomorrow).   - Hold Coreg and ACEI with low BP.  - Platelets low, so would hold off on RHC/Swan for now.  She has PICC in place, will make use of this for monitoring.  - We had a long discussion about what to expect here. 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  Cardiac output is marginal.  She would not be a transplant candidate.  With her dementia and NHL as well as questionable functional status, I do not think that she would be a good LVAD candidate.  For now, with co-ox 58% will try to manage her with po meds => digoxin, Lasix for now.  May end up needing milrinone infusion but will avoid if possible.   2. NHL: Completed most recent chemo regimen.  Per husband, had PET scan showing that disease is "non-active."   3. Dementia: x several years.  She is on Aricept.  She is drowsy but awakens and is appropriate.  Could some of this be related to low output? 4. Thrombocytopenia: Chronic, up to 47K from 35K. 5. PT/OT, mobilized.     45 minutes critical care time.   Loralie Champagne 03/06/2015 7:55 AM

## 2015-03-06 NOTE — Evaluation (Signed)
Occupational Therapy Evaluation Patient Details Name: Victoria Wood MRN: [id number] DOB: 11-05-1950 Today's Date: 03/06/2015    History of Present Illness 64 y.o. female admitted with Acute bronchiolitis, possible acute CHF, and pancytopenia. PMHx of non-Hodgkin lymphoma, pancytopenia, hypertension, GERD, diabetes mellitus type 2, autologous bone marrow transplant history.   Clinical Impression   Pt typically lives in [place] and was here visiting her daughter when she was admitted. Pt was supervised for ADL and assisted with IADL prior to admission. She used a rollator for ambulation and a travel chair for longer distances.  She presents with impaired memory/safety awareness and impaired balance interfering with ability to perform ADL and ADL transfers.  Per MD note, pt has baseline dementia. Resting HR of 117 with increase to 126 with activity.  Pt has a supportive family.  Will follow acutely.    Follow Up Recommendations  Home health OT;Supervision/Assistance - 24 hour    Equipment Recommendations       Recommendations for Other Services       Precautions / Restrictions Precautions Precautions: Fall Precaution Comments: monitor HR Restrictions Weight Bearing Restrictions: No      Mobility Bed Mobility Overal bed mobility: Modified Independent                Transfers Overall transfer level: Needs assistance Equipment used: None Transfers: Sit to/from Stand;Stand Pivot Transfers Sit to Stand: Min guard Stand pivot transfers: Min guard       General transfer comment: cues for safety, assist to manage gowns for toileting    Balance Overall balance assessment: Needs assistance   Sitting balance-Leahy Scale: Good       Standing balance-Leahy Scale: Fair                              ADL Overall ADL's : Needs assistance/impaired Eating/Feeding: Independent;Sitting   Grooming: Wash/dry hands;Min guard;Standing   Upper Body Bathing: Set  up;Sitting   Lower Body Bathing: Min guard;Sit to/from stand   Upper Body Dressing : Set up;Sitting   Lower Body Dressing: Min guard;Sit to/from stand Lower Body Dressing Details (indicate cue type and reason): able to donn and doff socks independently Toilet Transfer: Min guard;Stand-pivot;BSC   Toileting- Clothing Manipulation and Hygiene: Min guard;Sit to/from stand Toileting - Clothing Manipulation Details (indicate cue type and reason): managed adult diaper and pericare herself             Vision     Perception     Praxis      Pertinent Vitals/Pain Pain Assessment: No/denies pain     Hand Dominance Right   Extremity/Trunk Assessment Upper Extremity Assessment Upper Extremity Assessment: Overall WFL for tasks assessed   Lower Extremity Assessment Lower Extremity Assessment: Defer to PT evaluation       Communication Communication Communication: No difficulties   Cognition Arousal/Alertness: Awake/alert Behavior During Therapy: Impulsive Overall Cognitive Status: Impaired/Different from baseline Area of Impairment: Orientation;Memory Orientation Level: Disoriented to;Time   Memory: Decreased short-term memory       Problem Solving: Slow processing;Requires verbal cues General Comments: decreased word finding, difficulty recalling medical events, per MD note pt has baseline demential   General Comments       Exercises       Shoulder Instructions      Home Living Family/patient expects to be discharged to:: Private residence Living Arrangements: Spouse/significant other;Children Available Help at Discharge: Family;Available 24 hours/day Type of Home: House Home Access: Stairs  to enter Entrance Stairs-Number of Steps: 3 Entrance Stairs-Rails: None Home Layout: Two level Alternate Level Stairs-Number of Steps: flight Alternate Level Stairs-Rails: Right;Left Bathroom Shower/Tub: Occupational psychologist: Standard     Home  Equipment: Environmental consultant - 4 wheels   Additional Comments: Staying with daughter for the summer. Will return to home in [place] at end of summer and live with husband. Has a 3 in 1 and shower seat at home.      Prior Functioning/Environment Level of Independence: Needs assistance  Gait / Transfers Assistance Needed: rollator ADL's / Homemaking Assistance Needed: Needs assist to get into shower, husband and sister performed meal prep, housekeeping, grocery shopping.        OT Diagnosis: Generalized weakness;Cognitive deficits   OT Problem List: Decreased activity tolerance;Impaired balance (sitting and/or standing);Decreased cognition;Decreased safety awareness;Cardiopulmonary status limiting activity   OT Treatment/Interventions: Self-care/ADL training;DME and/or AE instruction;Patient/family education;Balance training;Therapeutic activities    OT Goals(Current goals can be found in the care plan section) Acute Rehab OT Goals Patient Stated Goal: get better, go home OT Goal Formulation: With patient Time For Goal Achievement: 03/13/15 Potential to Achieve Goals: Good  OT Frequency: Min 2X/week   Barriers to D/C:            Co-evaluation              End of Session    Activity Tolerance: Treatment limited secondary to medical complications (Comment) (HR 117 at rest, 126 with activity) Patient left: in bed;with call bell/phone within reach;with family/visitor present   Time: 7939-0300 OT Time Calculation (min): 19 min Charges:  OT General Charges $OT Visit: 1 Procedure OT Evaluation $Initial OT Evaluation Tier I: 1 Procedure G-Codes:    [name] 03/06/2015, 12:15 PM  [phone]

## 2015-03-06 NOTE — Progress Notes (Signed)
[facility] PROGRESS NOTE  Victoria Wood WCH:[id number] DOB: [birth year] DOA: 03/01/2015 PCP: Vicenta Aly, FNP  Summary 64 year old white female with history of non-Hodgkin's lymphoma requiring multiple rounds of chemotherapy, bone marrow transplant, most of her recent therapy in [place], presents with dyspnea, echocardiogram showing new  Severe systolic heart failure. Cardiology consult when patient transferred to stepdown on 6/25 for PICC line and inotropic therapy. Palliative medicine also consulted.  Assessment/Plan:    Acute systolic heart failure: new diagnosis. EF 15-20%. Might be chemo related.  -evaluated by heart failure team. IV lasix -milrinone if co-ox not improved prognosis is poor, given the severity of her cardiomyopathy, lymphoma, dementia, poor functional status -Palliative care team following.      Non-Hodgkin's lymphoma: per husband, on Rituxan and Bendamustine in [place]   GERD (gastroesophageal reflux disease)   DM type 2 (diabetes mellitus, type 2): levemir and SSI.   H/o Autologous bone marrow transplantation status   Pancytopenia: follow   Sinus tachycardia: Hypotension limiting titration of beta blocker   Dementia without behavioral disturbance. Per daughter "Alzheimers" Hypokalemia: Repleted. Magnesium normal Hypotension: improved of coreg   Code Status:  full Family Communication:   Disposition Plan: ?    HPI/Subjective: Slept better last night but still tired  Objective: Filed Vitals:   03/06/15 0506  BP: 101/66  Pulse: 117  Temp: 97.4 F (36.3 C)  Resp: 18    Intake/Output Summary (Last 24 hours) at 03/06/15 0757 Last data filed at 03/06/15 0240  Gross per 24 hour  Intake    210 ml  Output   1850 ml  Net  -1640 ml   Filed Weights   03/04/15 0416 03/05/15 0500 03/06/15 0500  Weight: 61.553 kg (135 lb 11.2 oz) 61.6 kg (135 lb 12.9 oz) 60.5 kg (133 lb 6.1 oz)   Tele: S. Tach  Exam:   General:  NAD- awakens but falls back  asleep  Cardiovascular: fast, regular  Respiratory: diminished  Abdomen: +Bs, soft  Ext: trace edema  Basic Metabolic Panel:  Recent Labs Lab 03/02/15 0430 03/03/15 0425 03/03/15 1914 03/04/15 0500 03/05/15 0500 03/06/15 0514  NA 141 140  --  144 142 140  K 3.6 3.8  --  2.9* 3.8 5.0  CL 112* 108  --  106 106 104  CO2 19* 24  --  27 29 29   GLUCOSE 325* 171*  --  65 58* 172*  BUN 17 22*  --  27* 27* 21*  CREATININE 0.95 0.99  --  1.14* 1.06* 1.09*  CALCIUM 8.6* 9.0  --  9.2 8.5* 8.8*  MG  --   --  1.8  --   --   --    Liver Function Tests:  Recent Labs Lab 03/01/15 1610 03/02/15 0430  AST 19 19  ALT 16 16  ALKPHOS 77 71  BILITOT 0.7 1.0  PROT 5.2* 5.3*  ALBUMIN 3.3* 3.3*   No results for input(s): LIPASE, AMYLASE in the last 168 hours. No results for input(s): AMMONIA in the last 168 hours. CBC:  Recent Labs Lab 03/01/15 1610  03/02/15 0430 03/03/15 0425 03/04/15 0500 03/05/15 0500 03/06/15 0514  WBC 2.9*  --  2.4* 5.2 4.7 2.8* 3.3*  NEUTROABS 1.7  --  2.2 3.9 3.2 2.0  --   HGB 9.0*  < > 9.0* 8.9* 9.0* 11.1* 9.7*  HCT 25.7*  < > 26.5* 25.7* 26.1* 32.6* 28.7*  MCV 101.6*  --  103.9* 102.4* 103.6* 104.2* 105.1*  PLT 55*  --  41* 48* 44* 35* 47*  < > = values in this interval not displayed. Cardiac Enzymes:  Recent Labs Lab 03/02/15 0430 03/02/15 0754  TROPONINI 0.04* 0.04*   BNP (last 3 results)  Recent Labs  03/01/15 1610 03/04/15 1005  BNP 2628.2* >4500.0*    ProBNP (last 3 results) No results for input(s): PROBNP in the last 8760 hours.  CBG:  Recent Labs Lab 03/04/15 2128 03/05/15 0645 03/05/15 0839 03/05/15 1231 03/05/15 1650  GLUCAP 119* 58* 180* 112* 179*    Recent Results (from the past 240 hour(s))  Blood Culture (routine x 2)     Status: None (Preliminary result)   Collection Time: 03/01/15  5:07 PM  Result Value Ref Range Status   Specimen Description BLOOD RIGHT WRIST  Final   Special Requests BOTTLES DRAWN  AEROBIC ONLY 3CC  Final   Culture NO GROWTH 4 DAYS  Final   Report Status PENDING  Incomplete  Blood Culture (routine x 2)     Status: None (Preliminary result)   Collection Time: 03/01/15  5:15 PM  Result Value Ref Range Status   Specimen Description BLOOD LEFT ARM  Final   Special Requests BOTTLES DRAWN AEROBIC AND ANAEROBIC 5CC  Final   Culture NO GROWTH 4 DAYS  Final   Report Status PENDING  Incomplete  Urine culture     Status: None   Collection Time: 03/01/15  7:25 PM  Result Value Ref Range Status   Specimen Description URINE, CLEAN CATCH  Final   Special Requests NONE  Final   Culture 2,000 COLONIES/mL INSIGNIFICANT GROWTH  Final   Report Status 03/03/2015 FINAL  Final  MRSA PCR Screening     Status: None   Collection Time: 03/04/15  6:40 PM  Result Value Ref Range Status   MRSA by PCR NEGATIVE NEGATIVE Final    Comment:        The GeneXpert MRSA Assay (FDA approved for NASAL specimens only), is one component of a comprehensive MRSA colonization surveillance program. It is not intended to diagnose MRSA infection nor to guide or monitor treatment for MRSA infections.      Studies: Dg Chest Port 1 View  03/04/2015   CLINICAL DATA:  CHF.  EXAM: PORTABLE CHEST - 1 VIEW  COMPARISON:  03/01/2015  FINDINGS: Right IJ Port-A-Cath unchanged. Examination demonstrates the lungs to be hypoinflated with worsening left base opacification likely a small effusion with associated atelectasis. Possible small amount right pleural fluid. Evidence of persistent mild vascular congestion. Borderline stable cardiomegaly. Remainder of the exam is unchanged.  IMPRESSION: Worsening left base opacification likely small effusion with atelectasis. Possible small amount right pleural fluid. Persistent mild vascular congestion.   Electronically Signed   By: Marin Olp M.D.   On: 03/04/2015 10:36   Echo Left ventricle: The cavity size was mildly dilated. Systolic function was normal. The estimated  ejection fraction was in the range of 15% to 20%. Severe diffuse hypokinesis. Although no diagnostic regional wall motion abnormality was identified, this possibility cannot be completely excluded on the basis of this study. - Aortic valve: There was mild regurgitation. - Mitral valve: There was moderate regurgitation. - Left atrium: The atrium was moderately dilated. - Right atrium: The atrium was moderately dilated. - Tricuspid valve: There was moderate regurgitation. - Pulmonary arteries: Systolic pressure was moderately increased. - Pericardium, extracardiac: A small pericardial effusion was identified not hemodynamically signiticant.. Difficult to tell if there is a loculated posterior effusion or if it is pleural.  There was a left pleural effusion.  Scheduled Meds: . acyclovir  800 mg Oral BID  . digoxin  0.125 mg Oral Daily  . donepezil  5 mg Oral Daily  . feeding supplement (ENSURE ENLIVE)  237 mL Oral BID BM  . furosemide  40 mg Intravenous Once  . insulin aspart  0-15 Units Subcutaneous TID WC  . insulin detemir  5 Units Subcutaneous QHS  . ipratropium  0.5 mg Nebulization TID  . pantoprazole  80 mg Oral Daily  . sodium chloride  10-40 mL Intracatheter Q12H  . venlafaxine XR  75 mg Oral Q breakfast   Continuous Infusions:   Time spent: 35 minutes  Victoria Wood  [facility]  [email or web address], password [facility] 03/06/2015, 7:57 AM  LOS: 5 days

## 2015-03-06 NOTE — Care Management (Signed)
Important Message  Patient Details  Name: Victoria Wood MRN: [id number] Date of Birth: 08-15-1951   Medicare Important Message Given:  Yes-second notification given    Nathen May 03/06/2015, 1:02 PM

## 2015-03-06 NOTE — Progress Notes (Signed)
Daily Progress Note   Patient Name: Victoria Wood       Date: 03/06/2015 DOB: 07/07/1951  Age: 64 y.o. MRN#: [id number] Attending Physician: Geradine Girt, DO Primary Care Physician: Vicenta Aly, FNP Admit Date: 03/01/2015  Reason for Consultation/Follow-up: Establishing goals of care  Subjective:     Victoria Wood and her husband is at bedside. They tell me that she is a strong lady and this is the 5th time she has had to deal with cancer. She gets confused and doesn't understand why her husband will not take her home with him. They are very overwhelmed to now be dealing with heart failure on top of her cancer. Her husband doesn't understand why this was not identified earlier as she has had symptoms for some time now. He also says they will have to make a decision to live here in [place] or in [place] (they have homes both places). They recognize they cannot likely go back and forth as she will need to stay close to her medical providers. They also tell me that her goal is to see her grandbaby be born in December. I will continue to follow and support.    Length of Stay: 5 days  Current Medications: Scheduled Meds:  . acyclovir  800 mg Oral BID  . digoxin  0.125 mg Oral Daily  . donepezil  5 mg Oral Daily  . feeding supplement (ENSURE ENLIVE)  237 mL Oral BID BM  . insulin aspart  0-15 Units Subcutaneous TID WC  . insulin detemir  8 Units Subcutaneous QHS  . ipratropium  0.5 mg Nebulization TID  . pantoprazole  80 mg Oral Daily  . sodium chloride  10-40 mL Intracatheter Q12H  . venlafaxine XR  75 mg Oral Q breakfast    Continuous Infusions:    PRN Meds: guaiFENesin, levalbuterol, menthol-cetylpyridinium, sodium chloride, sodium chloride  Palliative Performance Scale: 30%     Vital Signs: BP 90/64 mmHg  Pulse 117  Temp(Src) 98.1 F (36.7 C) (Oral)  Resp 17  Ht 5\' 7"  (1.702 m)  Wt 60.5 kg (133 lb 6.1 oz)  BMI 20.89 kg/m2  SpO2 97% SpO2: SpO2: 97 % O2 Device: O2  Device: Not Delivered O2 Flow Rate: O2 Flow Rate (L/min): 2 L/min  Intake/output summary:  Intake/Output Summary (Last 24 hours) at 03/06/15 1155 Last data filed at 03/06/15 1113  Gross per 24 hour  Intake    360 ml  Output   3200 ml  Net  -2840 ml   LBM: Last BM Date: 03/05/15 Baseline Weight: Weight: 55.339 kg (122 lb) Most recent weight: Weight: 60.5 kg (133 lb 6.1 oz)  Physical Exam: General: NAD, appears fatigued, thin, frail HEENT: [place]/AT CVS: Tachycardic Resp: No labored breathing Neuro: Arousable, lethargic, confused at times, oriented to person/place/somewhat situation   Additional Data Reviewed: Recent Labs     03/05/15  0500  03/06/15  0514  WBC  2.8*  3.3*  HGB  11.1*  9.7*  PLT  35*  47*  NA  142  140  BUN  27*  21*  CREATININE  1.06*  1.09*     Problem List:  Patient Active Problem List   Diagnosis Date Noted  . Palliative care encounter   . Acute systolic heart failure 74/25/9563  . Sinus tachycardia 03/03/2015  . Dementia without behavioral disturbance 03/03/2015  . SOB (shortness of breath) 03/01/2015  . Pancytopenia 05/21/2012  . Hypomagnesemia 01/13/2012  . 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  Insomnia 12/03/2011  . Hepatitis cholestatic 09/12/2011  . Epigastric pain 08/21/2011  . Non-Hodgkin's lymphoma   . GERD (gastroesophageal reflux disease)   . HTN (hypertension)   . DM type 2 (diabetes mellitus, type 2)      Palliative Care Assessment & Plan    Code Status:  Full code  Goals of Care:  Continue indicated interventions and are hopeful for improvement.   3. Symptom Management:  Dyspnea: Somewhat improved although she has been mostly in bed. Continue therapies per primary and heart failure team.    5. Prognosis: < 6 months very likely with multiple complex disease processes - did not discuss this today  5. Discharge Planning: To be determined - home with home health and they are deciding where they  would like to live here or in [place]. Likely benefit from outpatient palliative care services.    Thank you for allowing the Palliative Medicine Team to assist in the care of this patient.   Time In: 1100 Time Out: 1130 Total Time 33min Prolonged Time Billed  no     Greater than 50%  of this time was spent counseling and coordinating care related to the above assessment and plan.     Vinie Sill, NP Palliative Medicine Team Pager # [phone] (M-F 8a-5p) Team Phone # [phone] (Nights/Weekends)  03/06/2015, 11:55 AM

## 2015-03-07 LAB — GLUCOSE, CAPILLARY
Glucose-Capillary: 101 mg/dL — ABNORMAL HIGH (ref 65–99)
Glucose-Capillary: 232 mg/dL — ABNORMAL HIGH (ref 65–99)
Glucose-Capillary: 116 mg/dL — ABNORMAL HIGH (ref 65–99)
Glucose-Capillary: 286 mg/dL — ABNORMAL HIGH (ref 65–99)

## 2015-03-07 LAB — CBC
HCT: 30.5 % — ABNORMAL LOW (ref 36.0–46.0)
Hemoglobin: 10.5 g/dL — ABNORMAL LOW (ref 12.0–15.0)
MCH: 35.8 pg — ABNORMAL HIGH (ref 26.0–34.0)
MCHC: 34.4 g/dL (ref 30.0–36.0)
MCV: 104.1 fL — ABNORMAL HIGH (ref 78.0–100.0)
Platelets: 50 10*3/uL — ABNORMAL LOW (ref 150–400)
RBC: 2.93 MIL/uL — ABNORMAL LOW (ref 3.87–5.11)
RDW: 18.7 % — AB (ref 11.5–15.5)
WBC: 2.8 10*3/uL — ABNORMAL LOW (ref 4.0–10.5)

## 2015-03-07 LAB — CARBOXYHEMOGLOBIN
Carboxyhemoglobin: 1.9 % — ABNORMAL HIGH (ref 0.5–1.5)
Methemoglobin: 1.1 % (ref 0.0–1.5)
O2 Saturation: 57.8 %
Total hemoglobin: 11.1 g/dL — ABNORMAL LOW (ref 12.0–16.0)

## 2015-03-07 LAB — BASIC METABOLIC PANEL
Anion gap: 6 (ref 5–15)
BUN: 22 mg/dL — AB (ref 6–20)
CO2: 31 mmol/L (ref 22–32)
CREATININE: 0.99 mg/dL (ref 0.44–1.00)
Calcium: 8.7 mg/dL — ABNORMAL LOW (ref 8.9–10.3)
Chloride: 99 mmol/L — ABNORMAL LOW (ref 101–111)
GFR, EST NON AFRICAN AMERICAN: 58 mL/min — AB (ref >60)
GLUCOSE: 113 mg/dL — AB (ref 65–99)
POTASSIUM: 3.6 mmol/L (ref 3.5–5.1)
Sodium: 136 mmol/L (ref 135–145)

## 2015-03-07 MED ORDER — SPIRONOLACTONE 12.5 MG HALF TABLET
12.5000 mg | ORAL_TABLET | Freq: Every day | ORAL | Status: DC
  Administered 2015-03-07 – 2015-03-11 (×5): 12.5 mg via ORAL
  Filled 2015-03-07 (×5): qty 1

## 2015-03-07 MED ORDER — POTASSIUM CHLORIDE CRYS ER 20 MEQ PO TBCR
40.0000 meq | EXTENDED_RELEASE_TABLET | Freq: Once | ORAL | Status: AC
  Administered 2015-03-07: 40 meq via ORAL

## 2015-03-07 MED ORDER — IPRATROPIUM BROMIDE 0.02 % IN SOLN
0.5000 mg | Freq: Two times a day (BID) | RESPIRATORY_TRACT | Status: DC
  Administered 2015-03-07 – 2015-03-08 (×2): 0.5 mg via RESPIRATORY_TRACT
  Filled 2015-03-07 (×3): qty 2.5

## 2015-03-07 MED ORDER — IVABRADINE HCL 5 MG PO TABS
5.0000 mg | ORAL_TABLET | Freq: Two times a day (BID) | ORAL | Status: DC
  Administered 2015-03-07 (×2): 5 mg via ORAL
  Filled 2015-03-07 (×5): qty 1

## 2015-03-07 MED ORDER — FUROSEMIDE 40 MG PO TABS
40.0000 mg | ORAL_TABLET | Freq: Every day | ORAL | Status: DC
  Administered 2015-03-07: 40 mg via ORAL
  Filled 2015-03-07 (×2): qty 1

## 2015-03-07 NOTE — Progress Notes (Signed)
Daily Progress Note   Patient Name: Victoria Wood       Date: 03/07/2015 DOB: 1950-12-11  Age: 64 y.o. MRN#: [id number] Attending Physician: Geradine Girt, DO Primary Care Physician: Vicenta Aly, FNP Admit Date: 03/01/2015  Reason for Consultation/Follow-up: Establishing goals of care  Subjective:     Ms. Borchardt appears more alert and less lethargic today. Says she slept well last night. Husband and numerous other family members at bedside - tell me they are having a family meeting today to decide their next move after discharge (potentially tomorrow). She says she would like to go back to [place] however their bedroom and bathroom are upstairs. Sounds like they have good support either location. Encouraged them to consider staying here at least for now with home health and follow up appointment before deciding to go back to [place]. They do agree they should pick a location to stay for her to have continuity of care. No further questions or concerns and support provided.    Length of Stay: 6 days  Current Medications: Scheduled Meds:  . acyclovir  800 mg Oral BID  . digoxin  0.125 mg Oral Daily  . donepezil  5 mg Oral Daily  . feeding supplement (ENSURE ENLIVE)  237 mL Oral BID BM  . furosemide  40 mg Oral Daily  . insulin aspart  0-15 Units Subcutaneous TID WC  . insulin detemir  8 Units Subcutaneous QHS  . ipratropium  0.5 mg Nebulization BID  . ivabradine  5 mg Oral BID WC  . pantoprazole  80 mg Oral Daily  . sodium chloride  10-40 mL Intracatheter Q12H  . spironolactone  12.5 mg Oral Daily  . venlafaxine XR  75 mg Oral Q breakfast    Continuous Infusions:    PRN Meds: guaiFENesin, levalbuterol, menthol-cetylpyridinium, sodium chloride, sodium chloride  Palliative Performance Scale: 30%     Vital Signs: BP 92/42 mmHg  Pulse 107  Temp(Src) 97.9 F (36.6 C) (Oral)  Resp 19  Ht 5\' 7"  (1.702 m)  Wt 59.1 kg (130 lb 4.7 oz)  BMI 20.40 kg/m2  SpO2 96% SpO2: SpO2:  96 % O2 Device: O2 Device: Not Delivered O2 Flow Rate: O2 Flow Rate (L/min): 2 L/min  Intake/output summary:  Intake/Output Summary (Last 24 hours) at 03/07/15 1306 Last data filed at 03/07/15 1200  Gross per 24 hour  Intake    480 ml  Output   1350 ml  Net   -870 ml   LBM: Last BM Date: 03/07/15 Baseline Weight: Weight: 55.339 kg (122 lb) Most recent weight: Weight: 59.1 kg (130 lb 4.7 oz)  Physical Exam: General: NAD, appears less fatigued, thin, frail HEENT: /AT CVS: Tachycardic Resp: No labored breathing Neuro: More alert, confused at times, oriented to person/place/somewhat situation   Additional Data Reviewed: Recent Labs     03/06/15  0514  03/07/15  0623  WBC  3.3*  2.8*  HGB  9.7*  10.5*  PLT  47*  50*  NA  140  136  BUN  21*  22*  CREATININE  1.09*  0.99     Problem List:  Patient Active Problem List   Diagnosis Date Noted  . Palliative care encounter   . Acute systolic heart failure 23/76/2831  . Sinus tachycardia 03/03/2015  . Dementia without behavioral disturbance 03/03/2015  . SOB (shortness of breath) 03/01/2015  . Pancytopenia 05/21/2012  . Hypomagnesemia 01/13/2012  . 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 Insomnia 12/03/2011  .  Hepatitis cholestatic 09/12/2011  . Epigastric pain 08/21/2011  . Non-Hodgkin's lymphoma   . GERD (gastroesophageal reflux disease)   . HTN (hypertension)   . DM type 2 (diabetes mellitus, type 2)      Palliative Care Assessment & Plan    Code Status:  Full code  Goals of Care:  Continue indicated interventions and are hopeful for improvement.   3. Symptom Management:  Dyspnea: Somewhat improved although she has been mostly in bed. Continue therapies per primary and heart failure team.  5. Prognosis: < 6 months very likely with multiple complex disease processes - did not discuss this today  5. Discharge Planning: To be determined - home with home health and they are deciding  where they would like to live here or in [place]. Likely benefit from outpatient palliative care services.     Thank you for allowing the Palliative Medicine Team to assist in the care of this patient.    Time In: 1250 Time Out: 1310 Total Time 76min Prolonged Time Billed  no     Greater than 50%  of this time was spent counseling and coordinating care related to the above assessment and plan.     Vinie Sill, NP Palliative Medicine Team Pager # [phone] (M-F 8a-5p) Team Phone # [phone] (Nights/Weekends)  03/07/2015, 1:06 PM

## 2015-03-07 NOTE — Progress Notes (Signed)
Patient ID: Victoria Wood, female   DOB: [birth year], 64 y.o.   MRN: [id number]    64 yo with long history of non-Hodgkin's lymphoma (since age 89) with multiple rounds of chemo and history of autologous bone marrow transplant was admitted with CHF.  She lives in [place] now and has her oncology care down there.  She was up visiting her daughter.  She has no history of cardiac problems.  She was seen by a cardiologist in 12/15 prior to R-CHOP (doxorubicin-containing chemo).  She had R-CHOP and then Rituxan/bendamustine more recently, which has been completed.  She had a PET scan not long ago, and she was told that her cancer is non-active.  Husband says that she has been told when she goes for infusions, etc, that her HR is high and her BP is low for months now.  She has had exertional dyspnea as well for several months.  She was admitted after her daughter noted her wheezing and she was taken to a primary MD and found to be hypoxic.  Also of note, she has had signs of dementia that have been progressive for 2-3 years.  She is on Aricept.   Echo this admission showed EF 15-20%, diffuse hypokinesis, moderate MR, mild AI.   She seems to be doing better.  Weight is down another 3 lbs.  CVP 7.  No dyspnea.  Did better walking with PT yesterday.  Remains tachycardic with soft BP.  Co-ox 58% this morning, 71% yesterday afternoon.   Scheduled Meds: . acyclovir  800 mg Oral BID  . digoxin  0.125 mg Oral Daily  . donepezil  5 mg Oral Daily  . feeding supplement (ENSURE ENLIVE)  237 mL Oral BID BM  . furosemide  40 mg Oral Daily  . insulin aspart  0-15 Units Subcutaneous TID WC  . insulin detemir  8 Units Subcutaneous QHS  . ipratropium  0.5 mg Nebulization TID  . ivabradine  5 mg Oral BID WC  . pantoprazole  80 mg Oral Daily  . potassium chloride  40 mEq Oral Once  . sodium chloride  10-40 mL Intracatheter Q12H  . spironolactone  12.5 mg Oral Daily  . venlafaxine XR  75 mg Oral Q breakfast   Continuous  Infusions:  PRN Meds:.guaiFENesin, levalbuterol, menthol-cetylpyridinium, sodium chloride, sodium chloride   Filed Vitals:   03/06/15 2000 03/07/15 0000 03/07/15 0400 03/07/15 0500  BP: 80/56 96/52 108/61   Pulse:  122 110   Temp: 98.1 F (36.7 C) 97.7 F (36.5 C) 97.8 F (36.6 C)   TempSrc: Oral Oral Oral   Resp:  26 22   Height:      Weight:    130 lb 4.7 oz (59.1 kg)  SpO2:  97% 98%     Intake/Output Summary (Last 24 hours) at 03/07/15 0755 Last data filed at 03/06/15 2115  Gross per 24 hour  Intake    360 ml  Output   3150 ml  Net  -2790 ml    LABS: Basic Metabolic Panel:  Recent Labs  03/06/15 0514 03/07/15 0623  NA 140 136  K 5.0 3.6  CL 104 99*  CO2 29 31  GLUCOSE 172* 113*  BUN 21* 22*  CREATININE 1.09* 0.99  CALCIUM 8.8* 8.7*   Liver Function Tests: No results for input(s): AST, ALT, ALKPHOS, BILITOT, PROT, ALBUMIN in the last 72 hours. No results for input(s): LIPASE, AMYLASE in the last 72 hours. CBC:  Recent Labs  03/05/15 0500 03/06/15  4650 03/07/15 0623  WBC 2.8* 3.3* 2.8*  NEUTROABS 2.0  --   --   HGB 11.1* 9.7* 10.5*  HCT 32.6* 28.7* 30.5*  MCV 104.2* 105.1* 104.1*  PLT 35* 47* 50*   Cardiac Enzymes: No results for input(s): CKTOTAL, CKMB, CKMBINDEX, TROPONINI in the last 72 hours. BNP: Invalid input(s): POCBNP D-Dimer: No results for input(s): DDIMER in the last 72 hours. Hemoglobin A1C: No results for input(s): HGBA1C in the last 72 hours. Fasting Lipid Panel: No results for input(s): CHOL, HDL, LDLCALC, TRIG, CHOLHDL, LDLDIRECT in the last 72 hours. Thyroid Function Tests: No results for input(s): TSH, T4TOTAL, T3FREE, THYROIDAB in the last 72 hours.  Invalid input(s): FREET3 Anemia Panel: No results for input(s): VITAMINB12, FOLATE, FERRITIN, TIBC, IRON, RETICCTPCT in the last 72 hours.  RADIOLOGY: Dg Chest 2 View  03/01/2015   CLINICAL DATA:  Cough since yesterday. Shortness of breath with exertion. History of  hypertension, asthma.  EXAM: CHEST  2 VIEW  COMPARISON:  05/19/2012  FINDINGS: Right Port-A-Cath remains in place, unchanged. Postsurgical changes in the left breast and axilla. Heart is normal size. Bibasilar opacities likely reflect atelectasis. No effusions. No acute bony abnormality. Mild rightward scoliosis in the thoracolumbar spine.  IMPRESSION: Bibasilar atelectasis.   Electronically Signed   By: Rolm Baptise M.D.   On: 03/01/2015 14:59   Nm Pulmonary Perf And Vent  03/01/2015   CLINICAL DATA:  Shortness of Breath  EXAM: NUCLEAR MEDICINE VENTILATION - PERFUSION LUNG SCAN  TECHNIQUE: Ventilation images were obtained in multiple projections using inhaled aerosol Tc-57m DTPA. Perfusion images were obtained in multiple projections after intravenous injection of Tc-62m MAA.  RADIOPHARMACEUTICALS:  Forty mCi Technetium-47m DTPA aerosol inhalation and 6 Technetium-93m MAA IV  COMPARISON:  Chest x-ray same day  FINDINGS: Ventilation: No focal ventilation defect.  Perfusion: No wedge shaped peripheral perfusion defects to suggest acute pulmonary embolism.  IMPRESSION: Low probability for pulmonary embolus.   Electronically Signed   By: Lahoma Crocker M.D.   On: 03/01/2015 22:18   Dg Chest Port 1 View  03/04/2015   CLINICAL DATA:  CHF.  EXAM: PORTABLE CHEST - 1 VIEW  COMPARISON:  03/01/2015  FINDINGS: Right IJ Port-A-Cath unchanged. Examination demonstrates the lungs to be hypoinflated with worsening left base opacification likely a small effusion with associated atelectasis. Possible small amount right pleural fluid. Evidence of persistent mild vascular congestion. Borderline stable cardiomegaly. Remainder of the exam is unchanged.  IMPRESSION: Worsening left base opacification likely small effusion with atelectasis. Possible small amount right pleural fluid. Persistent mild vascular congestion.   Electronically Signed   By: Marin Olp M.D.   On: 03/04/2015 10:36   Dg Chest Port 1 View  03/02/2015   CLINICAL  DATA:  Acute onset of shortness of breath. Initial encounter.  EXAM: PORTABLE CHEST - 1 VIEW  COMPARISON:  Chest radiograph performed earlier today at 2:41 p.m.  FINDINGS: The lungs are mildly hypoexpanded. Vascular congestion is noted. Mildly increased interstitial markings may reflect minimal interstitial edema. There is no evidence of pleural effusion or pneumothorax.  The cardiomediastinal silhouette is borderline normal in size. A right-sided chest port is noted ending about the mid SVC. Clips are seen overlying the left axilla. No acute osseous abnormalities are seen.  IMPRESSION: Lungs mildly hypoexpanded. Vascular congestion noted. Mildly increased interstitial markings may reflect minimal interstitial edema.   Electronically Signed   By: Garald Balding M.D.   On: 03/02/2015 00:04    PHYSICAL EXAM General: NAD Neck:  JVP 7 cm, no thyromegaly or thyroid nodule.  Lungs: Decreased breath sounds at bases bilaterally. CV: Lateral PMI.  Heart mildly tachy, regular S1/S2, +S3, 2/6 HSM apex.  No peripheral edema.   Abdomen: Soft, nontender, no hepatosplenomegaly, no distention.  Neurologic: Awake/alert, hard time remembering what happened yesterday.   Psych: Normal affect. Extremities: No clubbing or cyanosis.   TELEMETRY: Reviewed telemetry pt in sinus tachy in 100s-110s  ASSESSMENT AND PLAN: 64 yo with history of NHL, dementia, and thrombocytopenia was admitted with acute systolic CHF.  EF 15-20%, no prior history of heart problems.  No chest pain but she has been progressively dyspneic with tachycardia and low blood pressure since she had R-CHOP chemotherapy this winter (containing doxorubicin).  1. Acute systolic CHF: EF 96-43% by echo with moderate MR.  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  Symptoms have been progressive for several months and HR has been high/BP low for a while as well.  Volume status is improved and weight is down with IV Lasix, now CVP 7.  Co-ox 58% today  (71% yesterday pm) which is low but not markedly so.  She is on digoxin but no other heart failure meds due to low blood pressure.   - Continue digoxin.   - Transition to po Lasix 40 daily.   - Hold Coreg and ACEI with low BP.  - Start ivabradine 5 mg bid and spironolactone 12.5 mg daily.  - We have had a long discussion about what to expect here. 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  Cardiac output is marginal.  She would not be a transplant candidate.  With her dementia and NHL as well as questionable functional status, I do not think that she would be a good LVAD candidate.  For now, with co-ox 58% will try to manage her with po meds.   2. NHL: Completed most recent chemo regimen.  Per husband, had PET scan showing that disease is "non-active."   3. Dementia: x several years.  She is on Aricept.  She has trouble remembering what happened yesterday. 4. Thrombocytopenia: Chronic, up to 35 > 47 > 50. 5. PT/OT, mobilize.   Potentially home tomorrow if tolerates meds today?   Loralie Champagne 03/07/2015 7:55 AM

## 2015-03-07 NOTE — Progress Notes (Signed)
Inpatient Diabetes Program Recommendations  AACE/ADA: New Consensus Statement on Inpatient Glycemic Control (2013)  Target Ranges:  Prepandial:   less than 140 mg/dL      Peak postprandial:   less than 180 mg/dL (1-2 hours)      Critically ill patients:  140 - 180 mg/dL   Results for AILI, CASILLAS (MRN [id number]) as of 03/07/2015 08:25  Ref. Range 03/06/2015 07:58 03/06/2015 12:59 03/06/2015 16:52 03/06/2015 21:28 03/07/2015 08:04  Glucose-Capillary Latest Ref Range: 65-99 mg/dL 148 (H) 219 (H) 105 (H) 321 (H) 101 (H)   Current orders for Inpatient glycemic control: Levemir 8 units QHS, Novolog 0-15 units TID with meals  Inpatient Diabetes Program Recommendations Insulin - Meal Coverage: Please consider ordering Novolog 3 units TID with meals for meal coverage if patient eats at least 50% of meal.  Thanks, Barnie Alderman, RN, MSN, CCRN, CDE Diabetes Coordinator Inpatient Diabetes Program [phone] (Team Pager from [place] to [place]) [phone] (AP office) [phone] [facility] office) [phone] [facility] office)

## 2015-03-07 NOTE — Progress Notes (Signed)
Met with pt, spouse, and dtr @ bedside.  Pt and spouse are from [place] but also have a home here, one dtr lives in the area.  CM on 5W arranged RN, PT, and OT through [facility] when pt was admitted with heart failure dx.  Spouse states they have not decided whether they will return to [place] or stay in [place], also do not know whether they want to explore home hospice care vs home health.  Pt was receiving home health services in [place] and agency informed them that services could be reinstated if they return.  Family plans to meet tonight to discuss options.  Provided spouse with a list of private duty agencies per his request.

## 2015-03-07 NOTE — Care Management (Signed)
Important Message  Patient Details  Name: Victoria Wood MRN: [id number] Date of Birth: 1951-05-25   Medicare Important Message Given:  Yes-second notification given    Pricilla Handler 03/07/2015, 7:55 AM

## 2015-03-07 NOTE — Progress Notes (Signed)
[facility] PROGRESS NOTE  Victoria Wood LKT:[id number] DOB: 1951-08-06 DOA: 03/01/2015 PCP: Vicenta Aly, FNP  Summary 64 year old white female with history of non-Hodgkin's lymphoma requiring multiple rounds of chemotherapy, bone marrow transplant, most of her recent therapy in [place], presents with dyspnea, echocardiogram showing new  Severe systolic heart failure. Cardiology consult when patient transferred to stepdown on 6/25 for PICC line and inotropic therapy. Palliative medicine also consulted.  Assessment/Plan:   Acute systolic heart failure: new diagnosis. EF 15-20%. Might be chemo related. -- down 9.2L -evaluated by heart failure team. IV lasix -milrinone if co-ox not improved prognosis is poor, given the severity of her cardiomyopathy, lymphoma, dementia, poor functional status -Palliative care team following.      Non-Hodgkin's lymphoma: per husband, on Rituxan and Bendamustine in [place]   GERD (gastroesophageal reflux disease)   DM type 2 (diabetes mellitus, type 2): levemir and SSI.   H/o Autologous bone marrow transplantation status   Pancytopenia: follow   Sinus tachycardia: Hypotension limiting titration of beta blocker   Dementia without behavioral disturbance. Per daughter "Alzheimers" Hypokalemia: Repleted. Magnesium normal Hypotension: improved of coreg   Code Status:  full Family Communication:   Disposition Plan: home in AM???  HPI/Subjective: Feeling well today  Objective: Filed Vitals:   03/07/15 0400  BP: 108/61  Pulse: 110  Temp: 97.8 F (36.6 C)  Resp: 22    Intake/Output Summary (Last 24 hours) at 03/07/15 0743 Last data filed at 03/06/15 2115  Gross per 24 hour  Intake    360 ml  Output   3150 ml  Net  -2790 ml   Filed Weights   03/05/15 0500 03/06/15 0500 03/07/15 0500  Weight: 61.6 kg (135 lb 12.9 oz) 60.5 kg (133 lb 6.1 oz) 59.1 kg (130 lb 4.7 oz)    Exam:   General:  NAD- awakens but falls back  asleep  Cardiovascular: fast, regular  Respiratory: diminished  Abdomen: +Bs, soft  Ext: trace edema  Basic Metabolic Panel:  Recent Labs Lab 03/03/15 0425 03/03/15 1914 03/04/15 0500 03/05/15 0500 03/06/15 0514 03/07/15 0623  NA 140  --  144 142 140 136  K 3.8  --  2.9* 3.8 5.0 3.6  CL 108  --  106 106 104 99*  CO2 24  --  27 29 29 31   GLUCOSE 171*  --  65 58* 172* 113*  BUN 22*  --  27* 27* 21* 22*  CREATININE 0.99  --  1.14* 1.06* 1.09* 0.99  CALCIUM 9.0  --  9.2 8.5* 8.8* 8.7*  MG  --  1.8  --   --   --   --    Liver Function Tests:  Recent Labs Lab 03/01/15 1610 03/02/15 0430  AST 19 19  ALT 16 16  ALKPHOS 77 71  BILITOT 0.7 1.0  PROT 5.2* 5.3*  ALBUMIN 3.3* 3.3*   No results for input(s): LIPASE, AMYLASE in the last 168 hours. No results for input(s): AMMONIA in the last 168 hours. CBC:  Recent Labs Lab 03/01/15 1610  03/02/15 0430 03/03/15 0425 03/04/15 0500 03/05/15 0500 03/06/15 0514 03/07/15 0623  WBC 2.9*  --  2.4* 5.2 4.7 2.8* 3.3* 2.8*  NEUTROABS 1.7  --  2.2 3.9 3.2 2.0  --   --   HGB 9.0*  < > 9.0* 8.9* 9.0* 11.1* 9.7* 10.5*  HCT 25.7*  < > 26.5* 25.7* 26.1* 32.6* 28.7* 30.5*  MCV 101.6*  --  103.9* 102.4* 103.6* 104.2* 105.1* 104.1*  PLT  55*  --  41* 48* 44* 35* 47* 50*  < > = values in this interval not displayed. Cardiac Enzymes:  Recent Labs Lab 03/02/15 0430 03/02/15 0754  TROPONINI 0.04* 0.04*   BNP (last 3 results)  Recent Labs  03/01/15 1610 03/04/15 1005  BNP 2628.2* >4500.0*    ProBNP (last 3 results) No results for input(s): PROBNP in the last 8760 hours.  CBG:  Recent Labs Lab 03/05/15 1650 03/06/15 0758 03/06/15 1259 03/06/15 1652 03/06/15 2128  GLUCAP 179* 148* 219* 105* 321*    Recent Results (from the past 240 hour(s))  Blood Culture (routine x 2)     Status: None   Collection Time: 03/01/15  5:07 PM  Result Value Ref Range Status   Specimen Description BLOOD RIGHT WRIST  Final   Special  Requests BOTTLES DRAWN AEROBIC ONLY 3CC  Final   Culture NO GROWTH 5 DAYS  Final   Report Status 03/06/2015 FINAL  Final  Blood Culture (routine x 2)     Status: None   Collection Time: 03/01/15  5:15 PM  Result Value Ref Range Status   Specimen Description BLOOD LEFT ARM  Final   Special Requests BOTTLES DRAWN AEROBIC AND ANAEROBIC 5CC  Final   Culture NO GROWTH 5 DAYS  Final   Report Status 03/06/2015 FINAL  Final  Urine culture     Status: None   Collection Time: 03/01/15  7:25 PM  Result Value Ref Range Status   Specimen Description URINE, CLEAN CATCH  Final   Special Requests NONE  Final   Culture 2,000 COLONIES/mL INSIGNIFICANT GROWTH  Final   Report Status 03/03/2015 FINAL  Final  MRSA PCR Screening     Status: None   Collection Time: 03/04/15  6:40 PM  Result Value Ref Range Status   MRSA by PCR NEGATIVE NEGATIVE Final    Comment:        The GeneXpert MRSA Assay (FDA approved for NASAL specimens only), is one component of a comprehensive MRSA colonization surveillance program. It is not intended to diagnose MRSA infection nor to guide or monitor treatment for MRSA infections.      Studies: No results found. Echo Left ventricle: The cavity size was mildly dilated. Systolic function was normal. The estimated ejection fraction was in the range of 15% to 20%. Severe diffuse hypokinesis. Although no diagnostic regional wall motion abnormality was identified, this possibility cannot be completely excluded on the basis of this study. - Aortic valve: There was mild regurgitation. - Mitral valve: There was moderate regurgitation. - Left atrium: The atrium was moderately dilated. - Right atrium: The atrium was moderately dilated. - Tricuspid valve: There was moderate regurgitation. - Pulmonary arteries: Systolic pressure was moderately increased. - Pericardium, extracardiac: A small pericardial effusion was identified not hemodynamically signiticant..  Difficult to tell if there is a loculated posterior effusion or if it is pleural. There was a left pleural effusion.  Scheduled Meds: . acyclovir  800 mg Oral BID  . digoxin  0.125 mg Oral Daily  . donepezil  5 mg Oral Daily  . feeding supplement (ENSURE ENLIVE)  237 mL Oral BID BM  . insulin aspart  0-15 Units Subcutaneous TID WC  . insulin detemir  8 Units Subcutaneous QHS  . ipratropium  0.5 mg Nebulization TID  . ivabradine  5 mg Oral BID WC  . pantoprazole  80 mg Oral Daily  . potassium chloride  40 mEq Oral Once  . sodium chloride  10-40  mL Intracatheter Q12H  . spironolactone  12.5 mg Oral Daily  . venlafaxine XR  75 mg Oral Q breakfast   Continuous Infusions:   Time spent: 35 minutes  Victoria Wood  [facility]  [email or web address], password [facility] 03/07/2015, 7:43 AM  LOS: 6 days

## 2015-03-08 DIAGNOSIS — I4891 Unspecified atrial fibrillation: Secondary | ICD-10-CM

## 2015-03-08 DIAGNOSIS — I4819 Other persistent atrial fibrillation: Secondary | ICD-10-CM | POA: Insufficient documentation

## 2015-03-08 LAB — CARBOXYHEMOGLOBIN
Carboxyhemoglobin: 2 % — ABNORMAL HIGH (ref 0.5–1.5)
METHEMOGLOBIN: 0.9 % (ref 0.0–1.5)
O2 Saturation: 70.8 %
TOTAL HEMOGLOBIN: 8.5 g/dL — AB (ref 12.0–16.0)

## 2015-03-08 LAB — GLUCOSE, CAPILLARY
GLUCOSE-CAPILLARY: 134 mg/dL — AB (ref 65–99)
GLUCOSE-CAPILLARY: 161 mg/dL — AB (ref 65–99)
Glucose-Capillary: 268 mg/dL — ABNORMAL HIGH (ref 65–99)
Glucose-Capillary: 172 mg/dL — ABNORMAL HIGH (ref 65–99)

## 2015-03-08 LAB — BASIC METABOLIC PANEL
ANION GAP: 10 (ref 5–15)
BUN: 25 mg/dL — ABNORMAL HIGH (ref 6–20)
CALCIUM: 8.9 mg/dL (ref 8.9–10.3)
CO2: 31 mmol/L (ref 22–32)
Chloride: 96 mmol/L — ABNORMAL LOW (ref 101–111)
Creatinine, Ser: 1.04 mg/dL — ABNORMAL HIGH (ref 0.44–1.00)
GFR calc Af Amer: >60 mL/min (ref >60)
GFR calc non Af Amer: 55 mL/min — ABNORMAL LOW (ref >60)
GLUCOSE: 116 mg/dL — AB (ref 65–99)
POTASSIUM: 3.5 mmol/L (ref 3.5–5.1)
Sodium: 137 mmol/L (ref 135–145)

## 2015-03-08 LAB — CBC
HCT: 29.9 % — ABNORMAL LOW (ref 36.0–46.0)
Hemoglobin: 10.3 g/dL — ABNORMAL LOW (ref 12.0–15.0)
MCH: 35 pg — AB (ref 26.0–34.0)
MCHC: 34.4 g/dL (ref 30.0–36.0)
MCV: 101.7 fL — ABNORMAL HIGH (ref 78.0–100.0)
Platelets: 43 10*3/uL — ABNORMAL LOW (ref 150–400)
RBC: 2.94 MIL/uL — ABNORMAL LOW (ref 3.87–5.11)
RDW: 18.3 % — AB (ref 11.5–15.5)
WBC: 3.1 10*3/uL — ABNORMAL LOW (ref 4.0–10.5)

## 2015-03-08 MED ORDER — MAGNESIUM SULFATE IN D5W 10-5 MG/ML-% IV SOLN
1.0000 g | Freq: Once | INTRAVENOUS | Status: AC
  Administered 2015-03-08: 1 g via INTRAVENOUS
  Filled 2015-03-08: qty 100

## 2015-03-08 MED ORDER — POTASSIUM CHLORIDE CRYS ER 20 MEQ PO TBCR
40.0000 meq | EXTENDED_RELEASE_TABLET | Freq: Once | ORAL | Status: AC
  Administered 2015-03-08: 40 meq via ORAL
  Filled 2015-03-08: qty 2

## 2015-03-08 MED ORDER — AMIODARONE IV BOLUS ONLY 150 MG/100ML
150.0000 mg | Freq: Once | INTRAVENOUS | Status: AC
  Administered 2015-03-09: 150 mg via INTRAVENOUS
  Filled 2015-03-08: qty 100

## 2015-03-08 MED ORDER — AMIODARONE HCL IN DEXTROSE 360-4.14 MG/200ML-% IV SOLN
60.0000 mg/h | INTRAVENOUS | Status: AC
Start: 2015-03-08 — End: 2015-03-08
  Administered 2015-03-08: 60 mg/h via INTRAVENOUS
  Filled 2015-03-08 (×2): qty 200

## 2015-03-08 MED ORDER — AMIODARONE HCL IN DEXTROSE 360-4.14 MG/200ML-% IV SOLN
30.0000 mg/h | INTRAVENOUS | Status: DC
  Administered 2015-03-08 – 2015-03-11 (×5): 30 mg/h via INTRAVENOUS
  Filled 2015-03-08 (×13): qty 200

## 2015-03-08 NOTE — Progress Notes (Signed)
Patient ID: Victoria Wood, female   DOB: 1951/06/10, 64 y.o.   MRN: [id number]    64 yo with long history of non-Hodgkin's lymphoma (since age 38) with multiple rounds of chemo and history of autologous bone marrow transplant was admitted with CHF.  She lives in [place] now and has her oncology care down there.  She was up visiting her daughter.  She has no history of cardiac problems.  She was seen by a cardiologist in 12/15 prior to R-CHOP (doxorubicin-containing chemo).  She had R-CHOP and then Rituxan/bendamustine more recently, which has been completed.  She had a PET scan not long ago, and she was told that her cancer is non-active.  Husband says that she has been told when she goes for infusions, etc, that her HR is high and her BP is low for months now.  She has had exertional dyspnea as well for several months.  She was admitted after her daughter noted her wheezing and she was taken to a primary MD and found to be hypoxic.  Also of note, she has had signs of dementia that have been progressive for 2-3 years.  She is on Aricept.   Echo this admission showed EF 15-20%, diffuse hypokinesis, moderate MR, mild AI.   She was doing quite well yesterday afternoon but went into atrial fibrillation with RVR last night.  CVP this morning 4, co-ox 71%.  HR 120s-140s.  SBP 90s (stable).   Scheduled Meds: . acyclovir  800 mg Oral BID  . digoxin  0.125 mg Oral Daily  . donepezil  5 mg Oral Daily  . feeding supplement (ENSURE ENLIVE)  237 mL Oral BID BM  . insulin aspart  0-15 Units Subcutaneous TID WC  . insulin detemir  8 Units Subcutaneous QHS  . ipratropium  0.5 mg Nebulization BID  . magnesium sulfate 1 - 4 g bolus IVPB  1 g Intravenous Once  . pantoprazole  80 mg Oral Daily  . potassium chloride  40 mEq Oral Once  . sodium chloride  10-40 mL Intracatheter Q12H  . spironolactone  12.5 mg Oral Daily  . venlafaxine XR  75 mg Oral Q breakfast   Continuous Infusions: . amiodarone    . amiodarone      PRN Meds:.guaiFENesin, levalbuterol, menthol-cetylpyridinium, sodium chloride, sodium chloride   Filed Vitals:   03/08/15 0253 03/08/15 0300 03/08/15 0400 03/08/15 0448  BP: 81/48 103/52 92/56   Pulse: 159 92 89   Temp:   98.1 F (36.7 C)   TempSrc:   Oral   Resp: 23 18 19    Height:      Weight:    136 lb 0.4 oz (61.7 kg)  SpO2: 97% 92% 97%     Intake/Output Summary (Last 24 hours) at 03/08/15 0753 Last data filed at 03/08/15 0610  Gross per 24 hour  Intake    600 ml  Output   1780 ml  Net  -1180 ml    LABS: Basic Metabolic Panel:  Recent Labs  03/07/15 0623 03/08/15 0455  NA 136 137  K 3.6 3.5  CL 99* 96*  CO2 31 31  GLUCOSE 113* 116*  BUN 22* 25*  CREATININE 0.99 1.04*  CALCIUM 8.7* 8.9   Liver Function Tests: No results for input(s): AST, ALT, ALKPHOS, BILITOT, PROT, ALBUMIN in the last 72 hours. No results for input(s): LIPASE, AMYLASE in the last 72 hours. CBC:  Recent Labs  03/07/15 0623 03/08/15 0455  WBC 2.8* 3.1*  HGB 10.5*  10.3*  HCT 30.5* 29.9*  MCV 104.1* 101.7*  PLT 50* 43*   Cardiac Enzymes: No results for input(s): CKTOTAL, CKMB, CKMBINDEX, TROPONINI in the last 72 hours. BNP: Invalid input(s): POCBNP D-Dimer: No results for input(s): DDIMER in the last 72 hours. Hemoglobin A1C: No results for input(s): HGBA1C in the last 72 hours. Fasting Lipid Panel: No results for input(s): CHOL, HDL, LDLCALC, TRIG, CHOLHDL, LDLDIRECT in the last 72 hours. Thyroid Function Tests: No results for input(s): TSH, T4TOTAL, T3FREE, THYROIDAB in the last 72 hours.  Invalid input(s): FREET3 Anemia Panel: No results for input(s): VITAMINB12, FOLATE, FERRITIN, TIBC, IRON, RETICCTPCT in the last 72 hours.  RADIOLOGY: Dg Chest 2 View  03/01/2015   CLINICAL DATA:  Cough since yesterday. Shortness of breath with exertion. History of hypertension, asthma.  EXAM: CHEST  2 VIEW  COMPARISON:  05/19/2012  FINDINGS: Right Port-A-Cath remains in place,  unchanged. Postsurgical changes in the left breast and axilla. Heart is normal size. Bibasilar opacities likely reflect atelectasis. No effusions. No acute bony abnormality. Mild rightward scoliosis in the thoracolumbar spine.  IMPRESSION: Bibasilar atelectasis.   Electronically Signed   By: Rolm Baptise M.D.   On: 03/01/2015 14:59   Nm Pulmonary Perf And Vent  03/01/2015   CLINICAL DATA:  Shortness of Breath  EXAM: NUCLEAR MEDICINE VENTILATION - PERFUSION LUNG SCAN  TECHNIQUE: Ventilation images were obtained in multiple projections using inhaled aerosol Tc-69m DTPA. Perfusion images were obtained in multiple projections after intravenous injection of Tc-71m MAA.  RADIOPHARMACEUTICALS:  Forty mCi Technetium-60m DTPA aerosol inhalation and 6 Technetium-40m MAA IV  COMPARISON:  Chest x-ray same day  FINDINGS: Ventilation: No focal ventilation defect.  Perfusion: No wedge shaped peripheral perfusion defects to suggest acute pulmonary embolism.  IMPRESSION: Low probability for pulmonary embolus.   Electronically Signed   By: Lahoma Crocker M.D.   On: 03/01/2015 22:18   Dg Chest Port 1 View  03/04/2015   CLINICAL DATA:  CHF.  EXAM: PORTABLE CHEST - 1 VIEW  COMPARISON:  03/01/2015  FINDINGS: Right IJ Port-A-Cath unchanged. Examination demonstrates the lungs to be hypoinflated with worsening left base opacification likely a small effusion with associated atelectasis. Possible small amount right pleural fluid. Evidence of persistent mild vascular congestion. Borderline stable cardiomegaly. Remainder of the exam is unchanged.  IMPRESSION: Worsening left base opacification likely small effusion with atelectasis. Possible small amount right pleural fluid. Persistent mild vascular congestion.   Electronically Signed   By: Marin Olp M.D.   On: 03/04/2015 10:36   Dg Chest Port 1 View  03/02/2015   CLINICAL DATA:  Acute onset of shortness of breath. Initial encounter.  EXAM: PORTABLE CHEST - 1 VIEW  COMPARISON:  Chest  radiograph performed earlier today at 2:41 p.m.  FINDINGS: The lungs are mildly hypoexpanded. Vascular congestion is noted. Mildly increased interstitial markings may reflect minimal interstitial edema. There is no evidence of pleural effusion or pneumothorax.  The cardiomediastinal silhouette is borderline normal in size. A right-sided chest port is noted ending about the mid SVC. Clips are seen overlying the left axilla. No acute osseous abnormalities are seen.  IMPRESSION: Lungs mildly hypoexpanded. Vascular congestion noted. Mildly increased interstitial markings may reflect minimal interstitial edema.   Electronically Signed   By: Garald Balding M.D.   On: 03/02/2015 00:04    PHYSICAL EXAM General: NAD Neck: JVP 7 cm, no thyromegaly or thyroid nodule.  Lungs: Decreased breath sounds at bases bilaterally. CV: Lateral PMI.  Heart tachy, irregular S1/S2, +  S3, 2/6 HSM apex.  No peripheral edema.   Abdomen: Soft, nontender, no hepatosplenomegaly, no distention.  Neurologic: Awake/alert, hard time remembering what happened yesterday.   Psych: Normal affect. Extremities: No clubbing or cyanosis.   TELEMETRY: Reviewed telemetry pt in afib 120s-140s  ASSESSMENT AND PLAN: 64 yo with history of NHL, dementia, and thrombocytopenia was admitted with acute systolic CHF.  EF 15-20%, no prior history of heart problems.  No chest pain but she has been progressively dyspneic with tachycardia and low blood pressure since she had R-CHOP chemotherapy this winter (containing doxorubicin).  1. Acute systolic CHF: EF 09-62% by echo with moderate MR.  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  Symptoms have been progressive for several months and HR has been high/BP low for a while as well.  Volume status is improved and weight is down with Lasix, now CVP 4.  Co-ox 71% today.  She is on digoxin, ivabradine, and spironolactone.   - Continue digoxin and spironolactone. Check digoxin level in am.    - Hold  Lasix today with CVP 4.    - Hold Coreg and ACEI with low BP.  - Stop ivabradine with atrial fibrillation.   - We have had a long discussion about what to expect here. 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  She would not be a transplant candidate.  With her dementia and NHL as well as questionable functional status, I do not think that she would be a good LVAD candidate.   2. Atrial fibrillation: With RVR.  New onset last night.  She is on digoxin.  Limited options for rate control with soft BP.  I will begin her on amiodarone infusion without bolus for rate control and possible rhythm control (< 24 hrs afib).  She is a poor anticoagulation candidate with significant thrombocytopenia and fairly high fall risk, will not start.  3. NHL: Completed most recent chemo regimen.  Per husband, had PET scan showing that disease is "non-active."   4. Dementia: x several years.  She is on Aricept.  She has trouble remembering what happened yesterday. 5. Thrombocytopenia: Chronic, 35 > 47 > 50 > 43  Shayanna Thatch Aundra Dubin 03/08/2015 7:53 AM

## 2015-03-08 NOTE — Progress Notes (Signed)
Inpatient Diabetes Program Recommendations  AACE/ADA: New Consensus Statement on Inpatient Glycemic Control (2013)  Target Ranges:  Prepandial:   less than 140 mg/dL      Peak postprandial:   less than 180 mg/dL (1-2 hours)      Critically ill patients:  140 - 180 mg/dL   Results for MELIS, TROCHEZ (MRN [id number]) as of 03/08/2015 08:42  Ref. Range 03/07/2015 08:04 03/07/2015 12:51 03/07/2015 17:20 03/07/2015 22:12 03/08/2015 07:57  Glucose-Capillary Latest Ref Range: 65-99 mg/dL 101 (H) 232 (H) 116 (H) 286 (H) 134 (H)   Current orders for Inpatient glycemic control: Levemir 8 units QHS, Novolog 0-15 units TID with meals  Inpatient Diabetes Program Recommendations Insulin - Meal Coverage: Please consider ordering Novolog 3 units TID with meals for meal coverage if patient eats at least 50% of meal.  Thanks, Barnie Alderman, RN, MSN, CCRN, CDE Diabetes Coordinator Inpatient Diabetes Program [phone] (Team Pager from [place] to [place]) [phone] ([facility] office) [phone] [facility] office) [phone] [facility] office)

## 2015-03-08 NOTE — Progress Notes (Signed)
OT Cancellation Note  Patient Details Name: Victoria Wood MRN: [id number] DOB: Jun 02, 1951   Cancelled Treatment:    Reason Eval/Treat Not Completed: Medical issues which prohibited therapy (Pt with fatigue and resting HR around 160.) Will continue to follow.  [name] 03/08/2015, 8:53 AM  [phone]

## 2015-03-08 NOTE — Progress Notes (Signed)
[facility] PROGRESS NOTE  Victoria Wood XNA:[id number] DOB: 1951-04-19 DOA: 03/01/2015 PCP: Vicenta Aly, FNP  Summary 64 year old white female with history of non-Hodgkin's lymphoma requiring multiple rounds of chemotherapy, bone marrow transplant, most of her recent therapy in [place], presents with dyspnea, echocardiogram showing new  Severe systolic heart failure. Cardiology consult when patient transferred to stepdown on 6/25 for PICC line and inotropic therapy. Palliative medicine also consulted.  Assessment/Plan:   Acute systolic heart failure: new diagnosis. EF 15-20%. Might be chemo related. -- down 9.2+L -evaluated by heart failure team. Lasix held today prognosis is poor, given the severity of her cardiomyopathy, lymphoma, dementia, poor functional status -Palliative care team following.    A fib with RVR -on dig -amiodarone being started    Non-Hodgkin's lymphoma: per husband, on Rituxan and Bendamustine in [place]   GERD (gastroesophageal reflux disease)   DM type 2 (diabetes mellitus, type 2): levemir and SSI.   H/o Autologous bone marrow transplantation status   Pancytopenia: follow   Dementia without behavioral disturbance. Per daughter "Alzheimers" Hypokalemia: Repleted Hypotension: mointor   Code Status:  full Family Communication:   Disposition Plan: when medically stable, suspect 2-3 days- home health  HPI/Subjective: Feeling well today  Objective: Filed Vitals:   03/08/15 0755  BP: 96/47  Pulse: 70  Temp: 97.7 F (36.5 C)  Resp: 18    Intake/Output Summary (Last 24 hours) at 03/08/15 0810 Last data filed at 03/08/15 0610  Gross per 24 hour  Intake    600 ml  Output   1780 ml  Net  -1180 ml   Filed Weights   03/06/15 0500 03/07/15 0500 03/08/15 0448  Weight: 60.5 kg (133 lb 6.1 oz) 59.1 kg (130 lb 4.7 oz) 61.7 kg (136 lb 0.4 oz)    Exam:   General:  Sleeping, ill appearing  Cardiovascular: fast, irr  Respiratory:  diminished  Abdomen: +Bs, soft  Ext: trace edema  Basic Metabolic Panel:  Recent Labs Lab 03/03/15 1914 03/04/15 0500 03/05/15 0500 03/06/15 0514 03/07/15 0623 03/08/15 0455  NA  --  144 142 140 136 137  K  --  2.9* 3.8 5.0 3.6 3.5  CL  --  106 106 104 99* 96*  CO2  --  27 29 29 31 31   GLUCOSE  --  65 58* 172* 113* 116*  BUN  --  27* 27* 21* 22* 25*  CREATININE  --  1.14* 1.06* 1.09* 0.99 1.04*  CALCIUM  --  9.2 8.5* 8.8* 8.7* 8.9  MG 1.8  --   --   --   --   --    Liver Function Tests:  Recent Labs Lab 03/01/15 1610 03/02/15 0430  AST 19 19  ALT 16 16  ALKPHOS 77 71  BILITOT 0.7 1.0  PROT 5.2* 5.3*  ALBUMIN 3.3* 3.3*   No results for input(s): LIPASE, AMYLASE in the last 168 hours. No results for input(s): AMMONIA in the last 168 hours. CBC:  Recent Labs Lab 03/01/15 1610  03/02/15 0430 03/03/15 0425 03/04/15 0500 03/05/15 0500 03/06/15 0514 03/07/15 0623 03/08/15 0455  WBC 2.9*  --  2.4* 5.2 4.7 2.8* 3.3* 2.8* 3.1*  NEUTROABS 1.7  --  2.2 3.9 3.2 2.0  --   --   --   HGB 9.0*  < > 9.0* 8.9* 9.0* 11.1* 9.7* 10.5* 10.3*  HCT 25.7*  < > 26.5* 25.7* 26.1* 32.6* 28.7* 30.5* 29.9*  MCV 101.6*  --  103.9* 102.4* 103.6* 104.2* 105.1* 104.1*  101.7*  PLT 55*  --  41* 48* 44* 35* 47* 50* 43*  < > = values in this interval not displayed. Cardiac Enzymes:  Recent Labs Lab 03/02/15 0430 03/02/15 0754  TROPONINI 0.04* 0.04*   BNP (last 3 results)  Recent Labs  03/01/15 1610 03/04/15 1005  BNP 2628.2* >4500.0*    ProBNP (last 3 results) No results for input(s): PROBNP in the last 8760 hours.  CBG:  Recent Labs Lab 03/07/15 0804 03/07/15 1251 03/07/15 1720 03/07/15 2212 03/08/15 0757  GLUCAP 101* 232* 116* 286* 134*    Recent Results (from the past 240 hour(s))  Blood Culture (routine x 2)     Status: None   Collection Time: 03/01/15  5:07 PM  Result Value Ref Range Status   Specimen Description BLOOD RIGHT WRIST  Final   Special  Requests BOTTLES DRAWN AEROBIC ONLY 3CC  Final   Culture NO GROWTH 5 DAYS  Final   Report Status 03/06/2015 FINAL  Final  Blood Culture (routine x 2)     Status: None   Collection Time: 03/01/15  5:15 PM  Result Value Ref Range Status   Specimen Description BLOOD LEFT ARM  Final   Special Requests BOTTLES DRAWN AEROBIC AND ANAEROBIC 5CC  Final   Culture NO GROWTH 5 DAYS  Final   Report Status 03/06/2015 FINAL  Final  Urine culture     Status: None   Collection Time: 03/01/15  7:25 PM  Result Value Ref Range Status   Specimen Description URINE, CLEAN CATCH  Final   Special Requests NONE  Final   Culture 2,000 COLONIES/mL INSIGNIFICANT GROWTH  Final   Report Status 03/03/2015 FINAL  Final  MRSA PCR Screening     Status: None   Collection Time: 03/04/15  6:40 PM  Result Value Ref Range Status   MRSA by PCR NEGATIVE NEGATIVE Final    Comment:        The GeneXpert MRSA Assay (FDA approved for NASAL specimens only), is one component of a comprehensive MRSA colonization surveillance program. It is not intended to diagnose MRSA infection nor to guide or monitor treatment for MRSA infections.      Studies: No results found. Echo Left ventricle: The cavity size was mildly dilated. Systolic function was normal. The estimated ejection fraction was in the range of 15% to 20%. Severe diffuse hypokinesis. Although no diagnostic regional wall motion abnormality was identified, this possibility cannot be completely excluded on the basis of this study. - Aortic valve: There was mild regurgitation. - Mitral valve: There was moderate regurgitation. - Left atrium: The atrium was moderately dilated. - Right atrium: The atrium was moderately dilated. - Tricuspid valve: There was moderate regurgitation. - Pulmonary arteries: Systolic pressure was moderately increased. - Pericardium, extracardiac: A small pericardial effusion was identified not hemodynamically signiticant..  Difficult to tell if there is a loculated posterior effusion or if it is pleural. There was a left pleural effusion.  Scheduled Meds: . acyclovir  800 mg Oral BID  . digoxin  0.125 mg Oral Daily  . donepezil  5 mg Oral Daily  . feeding supplement (ENSURE ENLIVE)  237 mL Oral BID BM  . insulin aspart  0-15 Units Subcutaneous TID WC  . insulin detemir  8 Units Subcutaneous QHS  . ipratropium  0.5 mg Nebulization BID  . magnesium sulfate 1 - 4 g bolus IVPB  1 g Intravenous Once  . pantoprazole  80 mg Oral Daily  . potassium chloride  40 mEq Oral Once  . sodium chloride  10-40 mL Intracatheter Q12H  . spironolactone  12.5 mg Oral Daily  . venlafaxine XR  75 mg Oral Q breakfast   Continuous Infusions: . amiodarone    . amiodarone      Time spent: 35 minutes  VANN, JESSICA  [facility]  [email or web address], password [facility] 03/08/2015, 8:10 AM  LOS: 7 days

## 2015-03-08 NOTE — Progress Notes (Signed)
Pt heart rate was sustained at 150-165. Pt was resting comfortably in bed with no complaints of chest pain or shortness of breath. EKG showed Sinus Tachycardia. Central monitoring is showing irregular heart rhythm and appears to be in and out of A. Fib and Sinus Tachycardia. BP 103/52. Baltazar Najjar, NP was notified. No new orders at this time. Will continue to monitor.   Hart Rochester, RN, BSN

## 2015-03-08 NOTE — Progress Notes (Signed)
PT Cancellation Note  Patient Details Name: Victoria Wood MRN: [id number] DOB: [birth year]   Cancelled Treatment:    Reason Eval/Treat Not Completed: Medical issues which prohibited therapy (HR in 160's.  Will check back tomorrow.  thanks.)   Irwin Brakeman F 03/08/2015, 9:43 AM  [facility] [phone] (pager)

## 2015-03-08 NOTE — Progress Notes (Signed)
Ms. Crady is sleeping and daughter is at bedside. Daughter says that family has discussed and will plan to stay in [place] and have her care here rather than going back to [place]. They were considering home today but then she had issues with atrial fibrillation so she remains hospitalized. They could very likely benefit from outpatient palliative care services as they have been overwhelmed with her hospitalization and have not been prepared for decision making but will be faced with many difficult decisions likely in the near future. Support provided.   Vinie Sill, NP Palliative Medicine Team Pager # [phone] (M-F 8a-5p) Team Phone # [phone] (Nights/Weekends)

## 2015-03-09 DIAGNOSIS — I483 Typical atrial flutter: Secondary | ICD-10-CM

## 2015-03-09 LAB — BASIC METABOLIC PANEL
ANION GAP: 9 (ref 5–15)
Anion gap: 9 (ref 5–15)
BUN: 16 mg/dL (ref 6–20)
BUN: 18 mg/dL (ref 6–20)
CO2: 30 mmol/L (ref 22–32)
CO2: 28 mmol/L (ref 22–32)
CREATININE: 0.96 mg/dL (ref 0.44–1.00)
CREATININE: 1 mg/dL (ref 0.44–1.00)
Calcium: 8.7 mg/dL — ABNORMAL LOW (ref 8.9–10.3)
Calcium: 8.5 mg/dL — ABNORMAL LOW (ref 8.9–10.3)
Chloride: 101 mmol/L (ref 101–111)
Chloride: 99 mmol/L — ABNORMAL LOW (ref 101–111)
GFR calc Af Amer: >60 mL/min (ref >60)
GFR, EST NON AFRICAN AMERICAN: 57 mL/min — AB (ref >60)
GLUCOSE: 245 mg/dL — AB (ref 65–99)
Glucose, Bld: 108 mg/dL — ABNORMAL HIGH (ref 65–99)
Potassium: 3.8 mmol/L (ref 3.5–5.1)
Potassium: 4.1 mmol/L (ref 3.5–5.1)
SODIUM: 140 mmol/L (ref 135–145)
Sodium: 136 mmol/L (ref 135–145)

## 2015-03-09 LAB — CBC
HCT: 27.6 % — ABNORMAL LOW (ref 36.0–46.0)
Hemoglobin: 9.5 g/dL — ABNORMAL LOW (ref 12.0–15.0)
MCH: 35.8 pg — AB (ref 26.0–34.0)
MCHC: 34.4 g/dL (ref 30.0–36.0)
MCV: 104.2 fL — ABNORMAL HIGH (ref 78.0–100.0)
PLATELETS: 45 10*3/uL — AB (ref 150–400)
RBC: 2.65 MIL/uL — ABNORMAL LOW (ref 3.87–5.11)
RDW: 18.5 % — ABNORMAL HIGH (ref 11.5–15.5)
WBC: 3.3 10*3/uL — ABNORMAL LOW (ref 4.0–10.5)

## 2015-03-09 LAB — GLUCOSE, CAPILLARY
Glucose-Capillary: 131 mg/dL — ABNORMAL HIGH (ref 65–99)
Glucose-Capillary: 231 mg/dL — ABNORMAL HIGH (ref 65–99)
Glucose-Capillary: 150 mg/dL — ABNORMAL HIGH (ref 65–99)
Glucose-Capillary: 226 mg/dL — ABNORMAL HIGH (ref 65–99)

## 2015-03-09 LAB — HEPARIN LEVEL (UNFRACTIONATED): Heparin Unfractionated: 0.48 IU/mL (ref 0.30–0.70)

## 2015-03-09 LAB — DIGOXIN LEVEL: Digoxin Level: 1 ng/mL (ref 0.8–2.0)

## 2015-03-09 MED ORDER — SODIUM CHLORIDE 0.9 % IJ SOLN: 3.0000 mL | INTRAMUSCULAR | Status: DC | PRN

## 2015-03-09 MED ORDER — DIGOXIN 0.0625 MG HALF TABLET
0.0625 mg | ORAL_TABLET | Freq: Every day | ORAL | Status: DC
  Administered 2015-03-09 – 2015-03-11 (×3): 0.0625 mg via ORAL
  Filled 2015-03-09 (×3): qty 1

## 2015-03-09 MED ORDER — SODIUM CHLORIDE 0.9 % IV SOLN: 250.0000 mL | INTRAVENOUS | Status: DC

## 2015-03-09 MED ORDER — SODIUM CHLORIDE 0.9 % IJ SOLN
3.0000 mL | Freq: Two times a day (BID) | INTRAMUSCULAR | Status: DC
  Administered 2015-03-09 – 2015-03-11 (×3): 3 mL via INTRAVENOUS

## 2015-03-09 MED ORDER — AMIODARONE IV BOLUS ONLY 150 MG/100ML
150.0000 mg | Freq: Once | INTRAVENOUS | Status: DC
  Filled 2015-03-09: qty 100

## 2015-03-09 MED ORDER — POTASSIUM CHLORIDE CRYS ER 20 MEQ PO TBCR
40.0000 meq | EXTENDED_RELEASE_TABLET | Freq: Once | ORAL | Status: AC
  Administered 2015-03-09: 40 meq via ORAL
  Filled 2015-03-09: qty 2

## 2015-03-09 MED ORDER — AMIODARONE LOAD VIA INFUSION
150.0000 mg | Freq: Once | INTRAVENOUS | Status: AC
  Administered 2015-03-09: 150 mg via INTRAVENOUS
  Filled 2015-03-09: qty 83.34

## 2015-03-09 MED ORDER — HEPARIN (PORCINE) IN NACL 100-0.45 UNIT/ML-% IJ SOLN
800.0000 [IU]/h | INTRAMUSCULAR | Status: DC
  Administered 2015-03-09: 1000 [IU]/h via INTRAVENOUS
  Administered 2015-03-10: 900 [IU]/h via INTRAVENOUS
  Filled 2015-03-09 (×3): qty 250

## 2015-03-09 NOTE — Progress Notes (Signed)
PT Cancellation Note  Patient Details Name: Victoria Wood MRN: [id number] DOB: 09/04/1951   Cancelled Treatment:    Reason Eval/Treat Not Completed: Medical issues which prohibited therapy (HR 137 at rest and BP 82/54 with plans for cardioversion tomorrow. Will hold at this time and attempt at later date)   Melford Aase 03/09/2015, 8:19 AM Elwyn Reach, [place]

## 2015-03-09 NOTE — Care Management (Signed)
Important Message  Patient Details  Name: Victoria Wood MRN: [id number] Date of Birth: 1950-11-26   Medicare Important Message Given:  Yes-third notification given    Pricilla Handler 03/09/2015, 2:26 PM

## 2015-03-09 NOTE — Progress Notes (Signed)
Daily Progress Note   Patient Name: Victoria Wood       Date: 03/09/2015 DOB: 1951/07/15  Age: 64 y.o. MRN#: [id number] Attending Physician: Geradine Girt, DO Primary Care Physician: Vicenta Aly, FNP Admit Date: 03/01/2015  Reason for Consultation/Follow-up: Establishing goals of care  Subjective:     Multiple family members at bedside today including husband. They are disappointed that she is not having issues with her heart rate/rhythm but want her to be as good as she can be before going home - she is anxious to get home as she was forgetful and asked about going home today and mentioned it often during our conversation. Listening and support provided. They have decided to stay here in [facility] and she has been treated in the past by Dr. Lamonte Sakai in the cancer center. They appreciate and like the heart failure team as well.   Length of Stay: 8 days  Current Medications: Scheduled Meds:  . acyclovir  800 mg Oral BID  . amiodarone  150 mg Intravenous Once  . digoxin  0.0625 mg Oral Daily  . donepezil  5 mg Oral Daily  . insulin aspart  0-15 Units Subcutaneous TID WC  . insulin detemir  8 Units Subcutaneous QHS  . pantoprazole  80 mg Oral Daily  . sodium chloride  10-40 mL Intracatheter Q12H  . sodium chloride  3 mL Intravenous Q12H  . spironolactone  12.5 mg Oral Daily  . venlafaxine XR  75 mg Oral Q breakfast    Continuous Infusions: . sodium chloride    . amiodarone 30 mg/hr (03/09/15 0250)  . heparin 1,000 Units/hr (03/09/15 1011)    PRN Meds: guaiFENesin, levalbuterol, menthol-cetylpyridinium, sodium chloride, sodium chloride, sodium chloride  Palliative Performance Scale: 30%     Vital Signs: BP 123/97 mmHg  Pulse 121  Temp(Src) 98.2 F (36.8 C) (Oral)  Resp 25  Ht 5\' 7"  (1.702 m)  Wt 61.1 kg (134 lb 11.2 oz)  BMI 21.09 kg/m2  SpO2 100% SpO2: SpO2: 100 % O2 Device: O2 Device: Not Delivered O2 Flow Rate: O2 Flow Rate (L/min): 1 L/min  Intake/output  summary:   Intake/Output Summary (Last 24 hours) at 03/09/15 1555 Last data filed at 03/09/15 1019  Gross per 24 hour  Intake 831.69 ml  Output    650 ml  Net 181.69 ml   LBM: Last BM Date: 03/07/15 Baseline Weight: Weight: 55.339 kg (122 lb) Most recent weight: Weight: 61.1 kg (134 lb 11.2 oz)  Physical Exam: General: NAD, appears less fatigued, thin, frail HEENT: [place]/AT CVS: Tachycardic Resp: No labored breathing Neuro: More alert, confused at times, oriented to person/place/somewhat situation   Additional Data Reviewed: Recent Labs     03/08/15  0455  03/09/15  0544  03/09/15  1020  WBC  3.1*  3.3*   --   HGB  10.3*  9.5*   --   PLT  43*  45*   --   NA  137  140  136  BUN  25*  16  18  CREATININE  1.04*  0.96  1.00     Problem List:  Patient Active Problem List   Diagnosis Date Noted  . Persistent atrial fibrillation   . Palliative care encounter   . Acute systolic heart failure 78/29/5621  . Sinus tachycardia 03/03/2015  . Dementia without behavioral disturbance 03/03/2015  . SOB (shortness of breath) 03/01/2015  . Pancytopenia 05/21/2012  . Hypomagnesemia 01/13/2012  . 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  Insomnia 12/03/2011  . Hepatitis cholestatic 09/12/2011  . Epigastric pain 08/21/2011  . Non-Hodgkin's lymphoma   . GERD (gastroesophageal reflux disease)   . HTN (hypertension)   . DM type 2 (diabetes mellitus, type 2)      Palliative Care Assessment & Plan    Code Status:  Full code  Goals of Care:  Continue indicated interventions and are hopeful for improvement.   3. Symptom Management:  Dyspnea: Somewhat improved although she has been mostly in bed. Continue therapies per primary and heart failure team.  5. Prognosis: < 6 months very likely with multiple complex disease processes - did not discuss this today  5. Discharge Planning: home with home health and likely benefit from outpatient palliative care  services.     Thank you for allowing the Palliative Medicine Team to assist in the care of this patient.    Time In: 1115 Time Out: 1135 Total Time 50min Prolonged Time Billed  no     Greater than 50%  of this time was spent counseling and coordinating care related to the above assessment and plan.     Vinie Sill, NP Palliative Medicine Team Pager # [phone] (M-F 8a-5p) Team Phone # [phone] (Nights/Weekends)  03/09/2015, 3:55 PM

## 2015-03-09 NOTE — Progress Notes (Signed)
Patient ID: Victoria Wood, female   DOB: Oct 23, 1950, 64 y.o.   MRN: [id number]    64 yo with long history of non-Hodgkin's lymphoma (since age 48) with multiple rounds of chemo and history of autologous bone marrow transplant was admitted with CHF.  She lives in [place] now and has her oncology care down there.  She was up visiting her daughter.  She has no history of cardiac problems.  She was seen by a cardiologist in 12/15 prior to R-CHOP (doxorubicin-containing chemo).  She had R-CHOP and then Rituxan/bendamustine more recently, which has been completed.  She had a PET scan not long ago, and she was told that her cancer is non-active.  Husband says that she has been told when she goes for infusions, etc, that her HR is high and her BP is low for months now.  She has had exertional dyspnea as well for several months.  She was admitted after her daughter noted her wheezing and she was taken to a primary MD and found to be hypoxic.  Also of note, she has had signs of dementia that have been progressive for 2-3 years.  She is on Aricept.   Echo this admission showed EF 15-20%, diffuse hypokinesis, moderate MR, mild AI.   She went in to atrial fibrillation with RVR early am 6/29.  She was started on amiodarone gtt, now in what appears to be atrial flutter with HR 130s.  CVP this morning 3.  SBP 80s-90s.  She feels ok, no complaints.    Scheduled Meds: . acyclovir  800 mg Oral BID  . amiodarone  150 mg Intravenous Once  . digoxin  0.0625 mg Oral Daily  . donepezil  5 mg Oral Daily  . feeding supplement (ENSURE ENLIVE)  237 mL Oral BID BM  . insulin aspart  0-15 Units Subcutaneous TID WC  . insulin detemir  8 Units Subcutaneous QHS  . pantoprazole  80 mg Oral Daily  . sodium chloride  10-40 mL Intracatheter Q12H  . spironolactone  12.5 mg Oral Daily  . venlafaxine XR  75 mg Oral Q breakfast   Continuous Infusions: . amiodarone 30 mg/hr (03/09/15 0250)   PRN Meds:.guaiFENesin, levalbuterol,  menthol-cetylpyridinium, sodium chloride, sodium chloride   Filed Vitals:   03/08/15 2023 03/09/15 0001 03/09/15 0400 03/09/15 0500  BP: 82/64 88/57 81/47    Pulse: 142 141 136   Temp: 98.7 F (37.1 C) 98.6 F (37 C) 98.6 F (37 C)   TempSrc: Oral Oral Oral   Resp: 20 25 21    Height:      Weight:    134 lb 11.2 oz (61.1 kg)  SpO2: 99% 97% 98%     Intake/Output Summary (Last 24 hours) at 03/09/15 0731 Last data filed at 03/09/15 0500  Gross per 24 hour  Intake 1282.1 ml  Output   1300 ml  Net  -17.9 ml    LABS: Basic Metabolic Panel:  Recent Labs  03/08/15 0455 03/09/15 0544  NA 137 140  K 3.5 3.8  CL 96* 101  CO2 31 30  GLUCOSE 116* 108*  BUN 25* 16  CREATININE 1.04* 0.96  CALCIUM 8.9 8.7*   Liver Function Tests: No results for input(s): AST, ALT, ALKPHOS, BILITOT, PROT, ALBUMIN in the last 72 hours. No results for input(s): LIPASE, AMYLASE in the last 72 hours. CBC:  Recent Labs  03/08/15 0455 03/09/15 0544  WBC 3.1* 3.3*  HGB 10.3* 9.5*  HCT 29.9* 27.6*  MCV 101.7* 104.2*  PLT 43* 45*   Cardiac Enzymes: No results for input(s): CKTOTAL, CKMB, CKMBINDEX, TROPONINI in the last 72 hours. BNP: Invalid input(s): POCBNP D-Dimer: No results for input(s): DDIMER in the last 72 hours. Hemoglobin A1C: No results for input(s): HGBA1C in the last 72 hours. Fasting Lipid Panel: No results for input(s): CHOL, HDL, LDLCALC, TRIG, CHOLHDL, LDLDIRECT in the last 72 hours. Thyroid Function Tests: No results for input(s): TSH, T4TOTAL, T3FREE, THYROIDAB in the last 72 hours.  Invalid input(s): FREET3 Anemia Panel: No results for input(s): VITAMINB12, FOLATE, FERRITIN, TIBC, IRON, RETICCTPCT in the last 72 hours.  RADIOLOGY: Dg Chest 2 View  03/01/2015   CLINICAL DATA:  Cough since yesterday. Shortness of breath with exertion. History of hypertension, asthma.  EXAM: CHEST  2 VIEW  COMPARISON:  05/19/2012  FINDINGS: Right Port-A-Cath remains in place,  unchanged. Postsurgical changes in the left breast and axilla. Heart is normal size. Bibasilar opacities likely reflect atelectasis. No effusions. No acute bony abnormality. Mild rightward scoliosis in the thoracolumbar spine.  IMPRESSION: Bibasilar atelectasis.   Electronically Signed   By: Rolm Baptise M.D.   On: 03/01/2015 14:59   Nm Pulmonary Perf And Vent  03/01/2015   CLINICAL DATA:  Shortness of Breath  EXAM: NUCLEAR MEDICINE VENTILATION - PERFUSION LUNG SCAN  TECHNIQUE: Ventilation images were obtained in multiple projections using inhaled aerosol Tc-78m DTPA. Perfusion images were obtained in multiple projections after intravenous injection of Tc-43m MAA.  RADIOPHARMACEUTICALS:  Forty mCi Technetium-59m DTPA aerosol inhalation and 6 Technetium-47m MAA IV  COMPARISON:  Chest x-ray same day  FINDINGS: Ventilation: No focal ventilation defect.  Perfusion: No wedge shaped peripheral perfusion defects to suggest acute pulmonary embolism.  IMPRESSION: Low probability for pulmonary embolus.   Electronically Signed   By: Lahoma Crocker M.D.   On: 03/01/2015 22:18   Dg Chest Port 1 View  03/04/2015   CLINICAL DATA:  CHF.  EXAM: PORTABLE CHEST - 1 VIEW  COMPARISON:  03/01/2015  FINDINGS: Right IJ Port-A-Cath unchanged. Examination demonstrates the lungs to be hypoinflated with worsening left base opacification likely a small effusion with associated atelectasis. Possible small amount right pleural fluid. Evidence of persistent mild vascular congestion. Borderline stable cardiomegaly. Remainder of the exam is unchanged.  IMPRESSION: Worsening left base opacification likely small effusion with atelectasis. Possible small amount right pleural fluid. Persistent mild vascular congestion.   Electronically Signed   By: Marin Olp M.D.   On: 03/04/2015 10:36   Dg Chest Port 1 View  03/02/2015   CLINICAL DATA:  Acute onset of shortness of breath. Initial encounter.  EXAM: PORTABLE CHEST - 1 VIEW  COMPARISON:  Chest  radiograph performed earlier today at 2:41 p.m.  FINDINGS: The lungs are mildly hypoexpanded. Vascular congestion is noted. Mildly increased interstitial markings may reflect minimal interstitial edema. There is no evidence of pleural effusion or pneumothorax.  The cardiomediastinal silhouette is borderline normal in size. A right-sided chest port is noted ending about the mid SVC. Clips are seen overlying the left axilla. No acute osseous abnormalities are seen.  IMPRESSION: Lungs mildly hypoexpanded. Vascular congestion noted. Mildly increased interstitial markings may reflect minimal interstitial edema.   Electronically Signed   By: Garald Balding M.D.   On: 03/02/2015 00:04    PHYSICAL EXAM General: NAD Neck: JVP 7 cm, no thyromegaly or thyroid nodule.  Lungs: Decreased breath sounds at bases bilaterally. CV: Lateral PMI.  Heart tachy, irregular S1/S2, no S3, 2/6 HSM apex.  No peripheral edema.  Abdomen: Soft, nontender, no hepatosplenomegaly, no distention.  Neurologic: Awake/alert, hard time remembering what happened yesterday.   Psych: Normal affect. Extremities: No clubbing or cyanosis.   TELEMETRY: Reviewed telemetry pt in probable atrial flutter HR 130s  ASSESSMENT AND PLAN: 64 yo with history of NHL, dementia, and thrombocytopenia was admitted with acute systolic CHF.  EF 15-20%, no prior history of heart problems.  No chest pain but she has been progressively dyspneic with tachycardia and low blood pressure since she had R-CHOP chemotherapy this winter (containing doxorubicin).  1. Acute systolic CHF: EF 44-97% by echo with moderate MR.  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  Symptoms have been progressive for several months and HR has been high/BP low for a while as well.  Volume status is improved and weight is down with Lasix, now CVP 3.  Good co-ox yesterday, not done today.  She is on digoxin and spironolactone.   - Continue digoxin and spironolactone. Digoxin  level 1, will decrease to 0.0625 mg daily.    - Hold Lasix today with CVP 3.    - Hold Coreg and ACEI with low BP.   - We have had a discussion about what to expect here. 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  She would not be a transplant candidate.  With her dementia and NHL as well as questionable functional status, I do not think that she would be a good LVAD candidate.   2. Atrial fibrillation/flutter: With RVR.  New onset 6/29.  She is on digoxin.  Limited options for rate control with soft BP.  I started her on amiodarone infusion without bolus for rate control and possible rhythm control (< 48 hrs afib).  She is a poor long-term anticoagulation candidate with significant thrombocytopenia and fairly high fall risk.  She converted to flutter with amiodarone but is still tachycardic.  Options here are not great => platelets 45K today, have been as low as 30K this admission.  I do not want her on long-term anticoagulation.  I am going to start heparin gtt today.  If she does not convert to NSR overnight, I will plan on DCCV tomorrow morning (w/o TEE given < 48 hrs atrial fib/flutter before anticoagulation). We had an in-depth discussion of CVA risk without full anticoagulation long-term.  3. NHL: Completed most recent chemo regimen.  Per husband, had PET scan showing that disease is "non-active."   4. Dementia: x several years.  She is on Aricept.  She has trouble remembering what happened yesterday. 5. Thrombocytopenia: Chronic, 35 > 47 > 50 > 43 > 45.   Loralie Champagne 03/09/2015 7:31 AM

## 2015-03-09 NOTE — Progress Notes (Signed)
Inpatient Diabetes Program Recommendations  AACE/ADA: New Consensus Statement on Inpatient Glycemic Control (2013)  Target Ranges:  Prepandial:   less than 140 mg/dL      Peak postprandial:   less than 180 mg/dL (1-2 hours)      Critically ill patients:  140 - 180 mg/dL   Results for LATTIE, RIEGE (MRN [id number]) as of 03/09/2015 07:55  Ref. Range 03/08/2015 07:57 03/08/2015 11:47 03/08/2015 16:26 03/08/2015 21:54  Glucose-Capillary Latest Ref Range: 65-99 mg/dL 134 (H) 268 (H) 172 (H) 161 (H)   Current orders for Inpatient glycemic control: Levemir 8 units QHS, Novolog 0-15 units TID with meals  Inpatient Diabetes Program Recommendations Insulin - Meal Coverage: Post prandial glucose is consistently elevated. Please consider ordering Novolog 3 units TID with meals for meal coverage if patient eats at least 50% of meal.  Thanks, Barnie Alderman, RN, MSN, CCRN, CDE Diabetes Coordinator Inpatient Diabetes Program [phone] (Team Pager from [place] to [place]) [phone] ([facility] office) [phone] [facility] office) [phone] [facility] office)

## 2015-03-09 NOTE — Progress Notes (Signed)
Nutrition Follow-up  DOCUMENTATION CODES:  Not applicable  INTERVENTION:  -D/c Ensure Enlive due to increased meal intake  NUTRITION DIAGNOSIS:  Inadequate oral intake related to  (decreased appetite) as evidenced by meal completion < 25%.  Progressing  GOAL:  Patient will meet greater than or equal to 90% of their needs  Met   MONITOR:  PO intake, Supplement acceptance, Skin, Weight trends, Labs, I & O's  REASON FOR ASSESSMENT:  Malnutrition Screening Tool    ASSESSMENT: Victoria Wood is a 64 y.o. female with Past medical history of non-Hodgkin's lymphoma with ongoing chemotherapy last chemotherapy 6 weeks ago at [facility], pancytopenia, hypertension, GERD, diabetes mellitus type 2, autologous bone marrow transplant history.The patient is presenting with complaints of cough and shortness of breath on ongoing since last one week progressively worsening.  Pt in with MD at time of visit. Pt was transferred to SDU on 03/04/15. Noted new dx of CHF, likely chemotherapy related, per hospitalist notes.   Appetite has improved since admission. Noted 50-100% meal completion. Pt has been refusing Ensure supplements; will d/c due to increased meal intake. Reviewed DM coordinator note; pt has been having elevated post-prandial levels.   Plan is for cardioversion tomorrow, due to increased heart rate and BP.   Palliaitve care following. Pt with poor prognosis.   Height:  Ht Readings from Last 1 Encounters:  03/02/15 5' 7"  (1.702 m)    Weight:  Wt Readings from Last 1 Encounters:  03/09/15 134 lb 11.2 oz (61.1 kg)    Ideal Body Weight:  61.4 kg  Wt Readings from Last 10 Encounters:  03/09/15 134 lb 11.2 oz (61.1 kg)  05/21/12 124 lb 8 oz (56.473 kg)  03/11/12 127 lb 12.8 oz (57.97 kg)  12/31/11 137 lb 6.4 oz (62.324 kg)  12/03/11 142 lb 4.8 oz (64.547 kg)  11/25/11 140 lb 8 oz (63.73 kg)  11/15/11 136 lb 6.4 oz (61.871 kg)  11/06/11 143 lb 6.4 oz  (65.046 kg)  10/25/11 140 lb (63.504 kg)  10/22/11 141 lb 9.6 oz (64.229 kg)    BMI:  Body mass index is 21.09 kg/(m^2).  Estimated Nutritional Needs:  Kcal:  1600-1800  Protein:  70-80 grams  Fluid:  1.6-1.8 L  Skin:  Reviewed, no issues  Diet Order:  Diet heart healthy/carb modified Room service appropriate?: Yes; Fluid consistency:: Thin Diet NPO time specified  EDUCATION NEEDS:  No education needs identified at this time   Intake/Output Summary (Last 24 hours) at 03/09/15 1530 Last data filed at 03/09/15 1019  Gross per 24 hour  Intake 831.69 ml  Output    650 ml  Net 181.69 ml    Last BM:  03/09/15  Ammiel Guiney A. Jimmye Norman, RD, LDN, CDE Pager: [phone] After hours Pager: [phone]

## 2015-03-09 NOTE — Progress Notes (Signed)
ANTICOAGULATION CONSULT NOTE - FOLLOW UP    HL = 0.48 (goal 0.3 - 0.7 units/mL) Heparin dosing weight = 61 kg   Assessment: 64 YOF continues on IV heparin for new-onset Afib.  Heparin level is therapeutic; no bleeding reported.  Medical team is aware patient has thrombocytopenia.   Plan: - Continue heparin gtt at 1000 units/hr - F/U AM labs    Victoria Wood, PharmD, BCPS 03/09/2015, 7:04 PM

## 2015-03-09 NOTE — Progress Notes (Signed)
ANTICOAGULATION CONSULT NOTE - Initial Consult  Pharmacy Consult for Heparin Indication: atrial fibrillation  Allergies  Allergen Reactions  . Penicillins Diarrhea  . Shellfish Allergy     migraines    Patient Measurements: Height: 5\' 7"  (170.2 cm) Weight: 134 lb 11.2 oz (61.1 kg) IBW/kg (Calculated) : 61.6  Vital Signs: Temp: 98.6 F (37 C) (06/30 0400) Temp Source: Oral (06/30 0400) BP: 81/47 mmHg (06/30 0400) Pulse Rate: 136 (06/30 0400)  Labs:  Recent Labs  03/07/15 0623 03/08/15 0455 03/09/15 0544  HGB 10.5* 10.3* 9.5*  HCT 30.5* 29.9* 27.6*  PLT 50* 43* 45*  CREATININE 0.99 1.04* 0.96    Estimated Creatinine Clearance: 55.6 mL/min (by C-G formula based on Cr of 0.96).   Medical History: Past Medical History  Diagnosis Date  . Gastric polyps     History of  . GERD (gastroesophageal reflux disease)   . Asthma   . Anemia   . Hyperlipidemia   . DM type 2 (diabetes mellitus, type 2)   . 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 Hypomagnesemia 01/13/2012  . Non Hodgkin's lymphoma     "5 times since she was 27" (03/02/2015)  . Cancer     hx of Rituxan in 07/2011  . Complication of anesthesia     has woken up during surgery   . History of blood transfusion "several"    "related to her chemo"  . Dementia     past early stage/spouse (03/02/2015)  . Depression    Assessment: 64yof with new onset afib on 6/29. She was started on amiodarone drip and converted to aflutter. She is not a good candidate for long term anticoagulation given significant thrombocytopenia (due to NHL) and fall risk. However, she needs to be cardioverted so she will begin IV heparin with plans for DCCV tomorrow if she doesn't convert on her own. Discussed with Dr. Aundra Dubin and will not be too aggressive with dosing given low platelets.  Goal of Therapy:  Heparin level 0.3-0.7 units/ml Monitor platelets by anticoagulation protocol: Yes   Plan:  1) Begin heparin at 1000  units/hr with NO bolus 2) Check 6 hour heparin level 3) Daily heparin level and CBC 4) Follow up plans for DCCV  Deboraha Sprang 03/09/2015,7:43 AM

## 2015-03-09 NOTE — Progress Notes (Signed)
OT Cancellation Note  Patient Details Name: Victoria Wood MRN: [id number] DOB: Jun 09, 1951   Cancelled Treatment:    Reason Eval/Treat Not Completed: Medical issues which prohibited therapy. HR 137 at rest and BP 82/54 with plans for cardioversion tomorrow. Will continue to follow.  [name] 03/09/2015, 11:50 AM

## 2015-03-09 NOTE — Progress Notes (Signed)
[facility] PROGRESS NOTE  Victoria Wood ZOX:[id number] DOB: 1951/02/07 DOA: 03/01/2015 PCP: Vicenta Aly, FNP  Summary 64 year old white female with history of non-Hodgkin's lymphoma requiring multiple rounds of chemotherapy, bone marrow transplant, most of her recent therapy in [place], presents with dyspnea, echocardiogram showing new  Severe systolic heart failure. Cardiology consult when patient transferred to stepdown on 6/25 for PICC line and inotropic therapy. Palliative medicine also consulted.  Assessment/Plan:   Acute systolic heart failure: new diagnosis. EF 15-20%. Might be chemo related. -- down 9.2+L -evaluated by heart failure team. Lasix held today prognosis is poor, given the severity of her cardiomyopathy, lymphoma, dementia, poor functional status  A fib with RVR -on dig -amiodarone being started -appears to be in sinus- get EKG- rate of 121    Non-Hodgkin's lymphoma: per husband, on Rituxan and Bendamustine in [place]   GERD (gastroesophageal reflux disease)   DM type 2 (diabetes mellitus, type 2): levemir and SSI.   H/o Autologous bone marrow transplantation status   Pancytopenia: follow   Dementia without behavioral disturbance. Per daughter "Alzheimers" Hypokalemia: Repleted Hypotension: mointor   Code Status:  full Family Communication:  Family at bedside Disposition Plan:   HPI/Subjective: Mildly confused, upset her husband is not here  Objective: Filed Vitals:   03/09/15 1200  BP: 86/50  Pulse: 121  Temp:   Resp: 27    Intake/Output Summary (Last 24 hours) at 03/09/15 1438 Last data filed at 03/09/15 1019  Gross per 24 hour  Intake 831.69 ml  Output    650 ml  Net 181.69 ml   Filed Weights   03/07/15 0500 03/08/15 0448 03/09/15 0500  Weight: 59.1 kg (130 lb 4.7 oz) 61.7 kg (136 lb 0.4 oz) 61.1 kg (134 lb 11.2 oz)    Exam:   General:  NAD  Cardiovascular: fast, rrr  Respiratory: diminished  Abdomen: +Bs, soft  Ext:  trace edema  Basic Metabolic Panel:  Recent Labs Lab 03/03/15 1914  03/06/15 0514 03/07/15 0623 03/08/15 0455 03/09/15 0544 03/09/15 1020  NA  --   < > 140 136 137 140 136  K  --   < > 5.0 3.6 3.5 3.8 4.1  CL  --   < > 104 99* 96* 101 99*  CO2  --   < > 29 31 31 30 28   GLUCOSE  --   < > 172* 113* 116* 108* 245*  BUN  --   < > 21* 22* 25* 16 18  CREATININE  --   < > 1.09* 0.99 1.04* 0.96 1.00  CALCIUM  --   < > 8.8* 8.7* 8.9 8.7* 8.5*  MG 1.8  --   --   --   --   --   --   < > = values in this interval not displayed. Liver Function Tests: No results for input(s): AST, ALT, ALKPHOS, BILITOT, PROT, ALBUMIN in the last 168 hours. No results for input(s): LIPASE, AMYLASE in the last 168 hours. No results for input(s): AMMONIA in the last 168 hours. CBC:  Recent Labs Lab 03/03/15 0425 03/04/15 0500 03/05/15 0500 03/06/15 0514 03/07/15 0623 03/08/15 0455 03/09/15 0544  WBC 5.2 4.7 2.8* 3.3* 2.8* 3.1* 3.3*  NEUTROABS 3.9 3.2 2.0  --   --   --   --   HGB 8.9* 9.0* 11.1* 9.7* 10.5* 10.3* 9.5*  HCT 25.7* 26.1* 32.6* 28.7* 30.5* 29.9* 27.6*  MCV 102.4* 103.6* 104.2* 105.1* 104.1* 101.7* 104.2*  PLT 48* 44* 35* 47* 50*  43* 45*   Cardiac Enzymes: No results for input(s): CKTOTAL, CKMB, CKMBINDEX, TROPONINI in the last 168 hours. BNP (last 3 results)  Recent Labs  03/01/15 1610 03/04/15 1005  BNP 2628.2* >4500.0*    ProBNP (last 3 results) No results for input(s): PROBNP in the last 8760 hours.  CBG:  Recent Labs Lab 03/08/15 1147 03/08/15 1626 03/08/15 2154 03/09/15 0821 03/09/15 1144  GLUCAP 268* 172* 161* 131* 231*    Recent Results (from the past 240 hour(s))  Blood Culture (routine x 2)     Status: None   Collection Time: 03/01/15  5:07 PM  Result Value Ref Range Status   Specimen Description BLOOD RIGHT WRIST  Final   Special Requests BOTTLES DRAWN AEROBIC ONLY 3CC  Final   Culture NO GROWTH 5 DAYS  Final   Report Status 03/06/2015 FINAL  Final   Blood Culture (routine x 2)     Status: None   Collection Time: 03/01/15  5:15 PM  Result Value Ref Range Status   Specimen Description BLOOD LEFT ARM  Final   Special Requests BOTTLES DRAWN AEROBIC AND ANAEROBIC 5CC  Final   Culture NO GROWTH 5 DAYS  Final   Report Status 03/06/2015 FINAL  Final  Urine culture     Status: None   Collection Time: 03/01/15  7:25 PM  Result Value Ref Range Status   Specimen Description URINE, CLEAN CATCH  Final   Special Requests NONE  Final   Culture 2,000 COLONIES/mL INSIGNIFICANT GROWTH  Final   Report Status 03/03/2015 FINAL  Final  MRSA PCR Screening     Status: None   Collection Time: 03/04/15  6:40 PM  Result Value Ref Range Status   MRSA by PCR NEGATIVE NEGATIVE Final    Comment:        The GeneXpert MRSA Assay (FDA approved for NASAL specimens only), is one component of a comprehensive MRSA colonization surveillance program. It is not intended to diagnose MRSA infection nor to guide or monitor treatment for MRSA infections.      Studies: No results found. Echo Left ventricle: The cavity size was mildly dilated. Systolic function was normal. The estimated ejection fraction was in the range of 15% to 20%. Severe diffuse hypokinesis. Although no diagnostic regional wall motion abnormality was identified, this possibility cannot be completely excluded on the basis of this study. - Aortic valve: There was mild regurgitation. - Mitral valve: There was moderate regurgitation. - Left atrium: The atrium was moderately dilated. - Right atrium: The atrium was moderately dilated. - Tricuspid valve: There was moderate regurgitation. - Pulmonary arteries: Systolic pressure was moderately increased. - Pericardium, extracardiac: A small pericardial effusion was identified not hemodynamically signiticant.. Difficult to tell if there is a loculated posterior effusion or if it is pleural. There was a left pleural  effusion.  Scheduled Meds: . acyclovir  800 mg Oral BID  . amiodarone  150 mg Intravenous Once  . digoxin  0.0625 mg Oral Daily  . donepezil  5 mg Oral Daily  . feeding supplement (ENSURE ENLIVE)  237 mL Oral BID BM  . insulin aspart  0-15 Units Subcutaneous TID WC  . insulin detemir  8 Units Subcutaneous QHS  . pantoprazole  80 mg Oral Daily  . sodium chloride  10-40 mL Intracatheter Q12H  . sodium chloride  3 mL Intravenous Q12H  . spironolactone  12.5 mg Oral Daily  . venlafaxine XR  75 mg Oral Q breakfast   Continuous Infusions: .  sodium chloride    . amiodarone 30 mg/hr (03/09/15 0250)  . heparin 1,000 Units/hr (03/09/15 1011)    Time spent: 35 minutes  Jaeveon Ashland  [facility]  [email or web address], password [email or web address] 03/09/2015, 2:38 PM  LOS: 8 days

## 2015-03-10 ENCOUNTER — Encounter (HOSPITAL_COMMUNITY)
Admission: EM | Disposition: A | Discharge status: Home / Self Care | Payer: Self-pay | Source: Home / Self Care | Attending: Internal Medicine

## 2015-03-10 DIAGNOSIS — I480 Paroxysmal atrial fibrillation: Secondary | ICD-10-CM

## 2015-03-10 LAB — CARBOXYHEMOGLOBIN
Carboxyhemoglobin: 1.6 % — ABNORMAL HIGH (ref 0.5–1.5)
Methemoglobin: 0.8 % (ref 0.0–1.5)
O2 Saturation: 88.6 %
Total hemoglobin: 9 g/dL — ABNORMAL LOW (ref 12.0–16.0)

## 2015-03-10 LAB — BASIC METABOLIC PANEL
Anion gap: 8 (ref 5–15)
BUN: 18 mg/dL (ref 6–20)
CO2: 28 mmol/L (ref 22–32)
CREATININE: 0.99 mg/dL (ref 0.44–1.00)
Calcium: 8.9 mg/dL (ref 8.9–10.3)
Chloride: 104 mmol/L (ref 101–111)
GFR calc non Af Amer: 58 mL/min — ABNORMAL LOW (ref >60)
GLUCOSE: 164 mg/dL — AB (ref 65–99)
POTASSIUM: 4.4 mmol/L (ref 3.5–5.1)
SODIUM: 140 mmol/L (ref 135–145)

## 2015-03-10 LAB — HEPARIN LEVEL (UNFRACTIONATED)
HEPARIN UNFRACTIONATED: 0.43 [IU]/mL (ref 0.30–0.70)
Heparin Unfractionated: 0.63 IU/mL (ref 0.30–0.70)

## 2015-03-10 LAB — GLUCOSE, CAPILLARY
GLUCOSE-CAPILLARY: 141 mg/dL — AB (ref 65–99)
GLUCOSE-CAPILLARY: 225 mg/dL — AB (ref 65–99)
Glucose-Capillary: 155 mg/dL — ABNORMAL HIGH (ref 65–99)
Glucose-Capillary: 162 mg/dL — ABNORMAL HIGH (ref 65–99)

## 2015-03-10 LAB — CBC
HCT: 26.9 % — ABNORMAL LOW (ref 36.0–46.0)
HEMOGLOBIN: 9.1 g/dL — AB (ref 12.0–15.0)
MCH: 35.5 pg — ABNORMAL HIGH (ref 26.0–34.0)
MCHC: 33.8 g/dL (ref 30.0–36.0)
MCV: 105.1 fL — AB (ref 78.0–100.0)
PLATELETS: 46 10*3/uL — AB (ref 150–400)
RBC: 2.56 MIL/uL — AB (ref 3.87–5.11)
RDW: 18.6 % — ABNORMAL HIGH (ref 11.5–15.5)
WBC: 3.4 10*3/uL — ABNORMAL LOW (ref 4.0–10.5)

## 2015-03-10 SURGERY — CARDIOVERSION
Anesthesia: Monitor Anesthesia Care

## 2015-03-10 MED ORDER — IVABRADINE HCL 5 MG PO TABS
5.0000 mg | ORAL_TABLET | Freq: Two times a day (BID) | ORAL | Status: DC
  Administered 2015-03-10 – 2015-03-11 (×3): 5 mg via ORAL
  Filled 2015-03-10 (×5): qty 1

## 2015-03-10 MED ORDER — FUROSEMIDE 40 MG PO TABS
40.0000 mg | ORAL_TABLET | Freq: Every day | ORAL | Status: DC
  Administered 2015-03-10 – 2015-03-11 (×2): 40 mg via ORAL
  Filled 2015-03-10 (×2): qty 1

## 2015-03-10 MED ORDER — TEMAZEPAM 7.5 MG PO CAPS
7.5000 mg | ORAL_CAPSULE | Freq: Every evening | ORAL | Status: DC | PRN
  Administered 2015-03-10: 7.5 mg via ORAL
  Filled 2015-03-10: qty 1

## 2015-03-10 MED ORDER — ACETAMINOPHEN 325 MG PO TABS
650.0000 mg | ORAL_TABLET | Freq: Once | ORAL | Status: AC
  Administered 2015-03-10: 650 mg via ORAL
  Filled 2015-03-10: qty 2

## 2015-03-10 NOTE — Progress Notes (Signed)
Patient ID: Victoria Wood, female   DOB: 1951-01-05, 64 y.o.   MRN: [id number] Patient ID: Victoria Wood, female   DOB: 07/25/1951, 64 y.o.   MRN: [id number]    64 yo with long history of non-Hodgkin's lymphoma (since age 59) with multiple rounds of chemo and history of autologous bone marrow transplant was admitted with CHF.  She lives in [place] now and has her oncology care down there.  She was up visiting her daughter.  She has no history of cardiac problems.  She was seen by a cardiologist in 12/15 prior to R-CHOP (doxorubicin-containing chemo).  She had R-CHOP and then Rituxan/bendamustine more recently, which has been completed.  She had a PET scan not long ago, and she was told that her cancer is non-active.  Husband says that she has been told when she goes for infusions, etc, that her HR is high and her BP is low for months now.  She has had exertional dyspnea as well for several months.  She was admitted after her daughter noted her wheezing and she was taken to a primary MD and found to be hypoxic.  Also of note, she has had signs of dementia that have been progressive for 2-3 years.  She is on Aricept.   Echo this admission showed EF 15-20%, diffuse hypokinesis, moderate MR, mild AI.   She went in to atrial fibrillation with RVR early am 6/29.  She was started on amiodarone gtt.  Today, she is back in NSR.  She feels better.  CVP 8.  Co-ox does not appear accurate.   Scheduled Meds: . acyclovir  800 mg Oral BID  . amiodarone  150 mg Intravenous Once  . digoxin  0.0625 mg Oral Daily  . donepezil  5 mg Oral Daily  . furosemide  40 mg Oral Daily  . insulin aspart  0-15 Units Subcutaneous TID WC  . insulin detemir  8 Units Subcutaneous QHS  . ivabradine  5 mg Oral BID WC  . pantoprazole  80 mg Oral Daily  . sodium chloride  10-40 mL Intracatheter Q12H  . sodium chloride  3 mL Intravenous Q12H  . spironolactone  12.5 mg Oral Daily  . venlafaxine XR  75 mg Oral Q breakfast   Continuous  Infusions: . sodium chloride    . amiodarone 30 mg/hr (03/09/15 1844)  . heparin 900 Units/hr (03/10/15 0526)   PRN Meds:.guaiFENesin, levalbuterol, menthol-cetylpyridinium, sodium chloride, sodium chloride, sodium chloride   Filed Vitals:   03/09/15 2006 03/10/15 0000 03/10/15 0430 03/10/15 0600  BP: 87/59 89/62  104/68  Pulse: 121     Temp: 98.3 F (36.8 C) 99 F (37.2 C)  98.8 F (37.1 C)  TempSrc: Oral Oral  Oral  Resp: 21 21  25   Height:      Weight:   139 lb 12.4 oz (63.4 kg)   SpO2: 96%       Intake/Output Summary (Last 24 hours) at 03/10/15 0727 Last data filed at 03/10/15 0600  Gross per 24 hour  Intake 1357.6 ml  Output    900 ml  Net  457.6 ml    LABS: Basic Metabolic Panel:  Recent Labs  03/09/15 1020 03/10/15 0430  NA 136 140  K 4.1 4.4  CL 99* 104  CO2 28 28  GLUCOSE 245* 164*  BUN 18 18  CREATININE 1.00 0.99  CALCIUM 8.5* 8.9   Liver Function Tests: No results for input(s): AST, ALT, ALKPHOS, BILITOT, PROT, ALBUMIN in the last  72 hours. No results for input(s): LIPASE, AMYLASE in the last 72 hours. CBC:  Recent Labs  03/09/15 0544 03/10/15 0430  WBC 3.3* 3.4*  HGB 9.5* 9.1*  HCT 27.6* 26.9*  MCV 104.2* 105.1*  PLT 45* 46*   Cardiac Enzymes: No results for input(s): CKTOTAL, CKMB, CKMBINDEX, TROPONINI in the last 72 hours. BNP: Invalid input(s): POCBNP D-Dimer: No results for input(s): DDIMER in the last 72 hours. Hemoglobin A1C: No results for input(s): HGBA1C in the last 72 hours. Fasting Lipid Panel: No results for input(s): CHOL, HDL, LDLCALC, TRIG, CHOLHDL, LDLDIRECT in the last 72 hours. Thyroid Function Tests: No results for input(s): TSH, T4TOTAL, T3FREE, THYROIDAB in the last 72 hours.  Invalid input(s): FREET3 Anemia Panel: No results for input(s): VITAMINB12, FOLATE, FERRITIN, TIBC, IRON, RETICCTPCT in the last 72 hours.  RADIOLOGY: Dg Chest 2 View  03/01/2015   CLINICAL DATA:  Cough since yesterday. Shortness  of breath with exertion. History of hypertension, asthma.  EXAM: CHEST  2 VIEW  COMPARISON:  05/19/2012  FINDINGS: Right Port-A-Cath remains in place, unchanged. Postsurgical changes in the left breast and axilla. Heart is normal size. Bibasilar opacities likely reflect atelectasis. No effusions. No acute bony abnormality. Mild rightward scoliosis in the thoracolumbar spine.  IMPRESSION: Bibasilar atelectasis.   Electronically Signed   By: Rolm Baptise M.D.   On: 03/01/2015 14:59   Nm Pulmonary Perf And Vent  03/01/2015   CLINICAL DATA:  Shortness of Breath  EXAM: NUCLEAR MEDICINE VENTILATION - PERFUSION LUNG SCAN  TECHNIQUE: Ventilation images were obtained in multiple projections using inhaled aerosol Tc-53m DTPA. Perfusion images were obtained in multiple projections after intravenous injection of Tc-70m MAA.  RADIOPHARMACEUTICALS:  Forty mCi Technetium-59m DTPA aerosol inhalation and 6 Technetium-44m MAA IV  COMPARISON:  Chest x-ray same day  FINDINGS: Ventilation: No focal ventilation defect.  Perfusion: No wedge shaped peripheral perfusion defects to suggest acute pulmonary embolism.  IMPRESSION: Low probability for pulmonary embolus.   Electronically Signed   By: Lahoma Crocker M.D.   On: 03/01/2015 22:18   Dg Chest Port 1 View  03/04/2015   CLINICAL DATA:  CHF.  EXAM: PORTABLE CHEST - 1 VIEW  COMPARISON:  03/01/2015  FINDINGS: Right IJ Port-A-Cath unchanged. Examination demonstrates the lungs to be hypoinflated with worsening left base opacification likely a small effusion with associated atelectasis. Possible small amount right pleural fluid. Evidence of persistent mild vascular congestion. Borderline stable cardiomegaly. Remainder of the exam is unchanged.  IMPRESSION: Worsening left base opacification likely small effusion with atelectasis. Possible small amount right pleural fluid. Persistent mild vascular congestion.   Electronically Signed   By: Marin Olp M.D.   On: 03/04/2015 10:36   Dg Chest  Port 1 View  03/02/2015   CLINICAL DATA:  Acute onset of shortness of breath. Initial encounter.  EXAM: PORTABLE CHEST - 1 VIEW  COMPARISON:  Chest radiograph performed earlier today at 2:41 p.m.  FINDINGS: The lungs are mildly hypoexpanded. Vascular congestion is noted. Mildly increased interstitial markings may reflect minimal interstitial edema. There is no evidence of pleural effusion or pneumothorax.  The cardiomediastinal silhouette is borderline normal in size. A right-sided chest port is noted ending about the mid SVC. Clips are seen overlying the left axilla. No acute osseous abnormalities are seen.  IMPRESSION: Lungs mildly hypoexpanded. Vascular congestion noted. Mildly increased interstitial markings may reflect minimal interstitial edema.   Electronically Signed   By: Garald Balding M.D.   On: 03/02/2015 00:04  PHYSICAL EXAM General: NAD Neck: JVP 7 cm, no thyromegaly or thyroid nodule.  Lungs: Decreased breath sounds at bases bilaterally. CV: Lateral PMI.  Heart tachy, irregular S1/S2, no S3, 2/6 HSM apex.  No peripheral edema.   Abdomen: Soft, nontender, no hepatosplenomegaly, no distention.  Neurologic: Awake/alert, hard time remembering what happened yesterday.   Psych: Normal affect. Extremities: No clubbing or cyanosis.   TELEMETRY: Reviewed telemetry pt in probable atrial flutter HR 130s  ASSESSMENT AND PLAN: 64 yo with history of NHL, dementia, and thrombocytopenia was admitted with acute systolic CHF.  EF 15-20%, no prior history of heart problems.  No chest pain but she has been progressively dyspneic with tachycardia and low blood pressure since she had R-CHOP chemotherapy this winter (containing doxorubicin).  1. Acute systolic CHF: EF 99-35% by echo with moderate MR.  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  Symptoms have been progressive for several months and HR has been high/BP low for a while as well.  She has diuresed well in the hospital, BP has  been soft.  CVP 8 today, co-ox not done.  She is on digoxin and spironolactone.   - Continue digoxin and spironolactone.     - CVP 8, restart po Lasix 40 mg daily.     - Hold Coreg and ACEI with low BP.   - Restart Corlanor 5 mg bid now that she is back in NSR.  - We have had a discussion about what to expect here. 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  She would not be a transplant candidate.  With her dementia and NHL as well as questionable functional status, I do not think that she would be a good LVAD candidate.   2. Atrial fibrillation/flutter: With RVR.  New onset 6/29.  She is on digoxin.  Limited options for rate control with soft BP.  I started her on amiodarone infusion without bolus for rate control and possible rhythm control (< 48 hrs afib).  She is a poor long-term anticoagulation candidate with significant thrombocytopenia and fairly high fall risk.  She converted to flutter with amiodarone on 6/30 but was still tachycardic.  I started her on heparin gtt in case she required DCCV.  She converted back to NSR overnight and is in NSR this morning.  - Continue amiodarone gtt today, probably convert to po tomorrow if remains in NSR.  - Would continue heparin gtt peri-conversion, as above would not send home on anticoagulation.  Plts stable at 45 K today. No overt bleeding.  3. NHL: Completed most recent chemo regimen.  Per husband, had PET scan showing that disease is "non-active."   4. Dementia: x several years.  She is on Aricept.  She has trouble remembering what happened yesterday. 5. Thrombocytopenia: Chronic, 35 > 47 > 50 > 43 > 45 > 46.   Loralie Champagne 03/10/2015 7:27 AM

## 2015-03-10 NOTE — Progress Notes (Signed)
OT Cancellation Note  Patient Details Name: Victoria Wood MRN: [id number] DOB: 07-03-1951   Cancelled Treatment:    Reason Eval/Treat Not Completed: Fatigue/lethargy limiting ability to participate. Pt sleeping soundly after restless night.  Husband requesting pt to not be disturbed.  Will return as schedule allows.  [name] 03/10/2015, 10:53 AM  [phone]

## 2015-03-10 NOTE — Progress Notes (Signed)
ANTICOAGULATION CONSULT NOTE - Follow Up Consult  Pharmacy Consult for heparin Indication: atrial fibrillation   Labs:  Recent Labs  03/08/15 0455 03/09/15 0544 03/09/15 1020 03/09/15 1807 03/10/15 0430  HGB 10.3* 9.5*  --   --  9.1*  HCT 29.9* 27.6*  --   --  26.9*  PLT 43* 45*  --   --  46*  HEPARINUNFRC  --   --   --  0.48 0.63  CREATININE 1.04* 0.96 1.00  --   --       Assessment: 64yo female remains therapeutic on heparin though given low (but stable) Plt dosing more conservatively.  Goal of Therapy:  Heparin level 0.3-0.5 units/ml   Plan:  Will decrease heparin gtt slightly to 900 units/hr and check level in [place], PharmD, BCPS  03/10/2015,5:27 AM

## 2015-03-10 NOTE — Progress Notes (Deleted)
ANTICOAGULATION CONSULT NOTE - Follow Up Consult  Pharmacy Consult for heparin Indication: atrial fibrillation   Labs:  Recent Labs  03/08/15 0455 03/09/15 0544 03/09/15 1020 03/09/15 1807 03/10/15 0430 03/10/15 1227  HGB 10.3* 9.5*  --   --  9.1*  --   HCT 29.9* 27.6*  --   --  26.9*  --   PLT 43* 45*  --   --  46*  --   HEPARINUNFRC  --   --   --  0.48 0.63 0.43  CREATININE 1.04* 0.96 1.00  --  0.99  --       Assessment: 64yo female remains therapeutic on heparin though given low (but stable) Plt dosing more conservatively.  Goal of Therapy:  Heparin level 0.3-0.5 units/ml   Plan:  Continue heparin at 900 units / hr Follow up AM  Thank you Anette Guarneri, PharmD [phone] 03/10/2015,1:13 PM

## 2015-03-10 NOTE — Progress Notes (Signed)
[facility] PROGRESS NOTE  Aixa Corsello ION:[id number] DOB: March 20, 1951 DOA: 03/01/2015 PCP: Vicenta Aly, FNP  Summary 64 year old white female with history of non-Hodgkin's lymphoma requiring multiple rounds of chemotherapy, bone marrow transplant, most of her recent therapy in [place], presents with dyspnea, echocardiogram showing new  Severe systolic heart failure. Cardiology consult when patient transferred to stepdown on 6/25 for PICC line and inotropic therapy. Palliative medicine also consulted.  Stay complicated with a fib with RVR-- converted to sinus on 6/30 with amiodarone  Assessment/Plan:   Acute systolic heart failure: new diagnosis. EF 15-20%.chemo related. -- down 9.2+L -evaluated by heart failure team. Lasix held today prognosis is poor, given the severity of her cardiomyopathy, lymphoma, dementia, poor functional status  A fib with RVR- converted back to sinus 6/30 -on dig -amiodarone being started - on heparin--- do not plan on d/c on anticoagulation    Non-Hodgkin's lymphoma: per husband, on Rituxan and Bendamustine in [place]   GERD (gastroesophageal reflux disease)   DM type 2 (diabetes mellitus, type 2): levemir and SSI.   H/o Autologous bone marrow transplantation status   Pancytopenia: follow   Dementia without behavioral disturbance. Per daughter "Alzheimers" Hypokalemia: Repleted Hypotension: mointor   Code Status:  full Family Communication:  Family at bedside Disposition Plan:  Home in AM???  HPI/Subjective: No complaints- no SOB, no CP  Objective: Filed Vitals:   03/10/15 0741  BP: 118/99  Pulse: 102  Temp: 97.5 F (36.4 C)  Resp: 19    Intake/Output Summary (Last 24 hours) at 03/10/15 0802 Last data filed at 03/10/15 0600  Gross per 24 hour  Intake 1357.6 ml  Output    900 ml  Net  457.6 ml   Filed Weights   03/08/15 0448 03/09/15 0500 03/10/15 0430  Weight: 61.7 kg (136 lb 0.4 oz) 61.1 kg (134 lb 11.2 oz) 63.4 kg (139 lb  12.4 oz)    Exam:   General:  NAD  Cardiovascular: rrr- low 100s  Respiratory: diminished  Abdomen: +Bs, soft  Ext: min edmea  Basic Metabolic Panel:  Recent Labs Lab 03/03/15 1914  03/07/15 0623 03/08/15 0455 03/09/15 0544 03/09/15 1020 03/10/15 0430  NA  --   < > 136 137 140 136 140  K  --   < > 3.6 3.5 3.8 4.1 4.4  CL  --   < > 99* 96* 101 99* 104  CO2  --   < > 31 31 30 28 28   GLUCOSE  --   < > 113* 116* 108* 245* 164*  BUN  --   < > 22* 25* 16 18 18   CREATININE  --   < > 0.99 1.04* 0.96 1.00 0.99  CALCIUM  --   < > 8.7* 8.9 8.7* 8.5* 8.9  MG 1.8  --   --   --   --   --   --   < > = values in this interval not displayed. Liver Function Tests: No results for input(s): AST, ALT, ALKPHOS, BILITOT, PROT, ALBUMIN in the last 168 hours. No results for input(s): LIPASE, AMYLASE in the last 168 hours. No results for input(s): AMMONIA in the last 168 hours. CBC:  Recent Labs Lab 03/04/15 0500 03/05/15 0500 03/06/15 0514 03/07/15 0623 03/08/15 0455 03/09/15 0544 03/10/15 0430  WBC 4.7 2.8* 3.3* 2.8* 3.1* 3.3* 3.4*  NEUTROABS 3.2 2.0  --   --   --   --   --   HGB 9.0* 11.1* 9.7* 10.5* 10.3* 9.5* 9.1*  HCT 26.1* 32.6* 28.7* 30.5* 29.9* 27.6* 26.9*  MCV 103.6* 104.2* 105.1* 104.1* 101.7* 104.2* 105.1*  PLT 44* 35* 47* 50* 43* 45* 46*   Cardiac Enzymes: No results for input(s): CKTOTAL, CKMB, CKMBINDEX, TROPONINI in the last 168 hours. BNP (last 3 results)  Recent Labs  03/01/15 1610 03/04/15 1005  BNP 2628.2* >4500.0*    ProBNP (last 3 results) No results for input(s): PROBNP in the last 8760 hours.  CBG:  Recent Labs Lab 03/08/15 2154 03/09/15 0821 03/09/15 1144 03/09/15 1536 03/09/15 2139  GLUCAP 161* 131* 231* 150* 226*    Recent Results (from the past 240 hour(s))  Blood Culture (routine x 2)     Status: None   Collection Time: 03/01/15  5:07 PM  Result Value Ref Range Status   Specimen Description BLOOD RIGHT WRIST  Final   Special  Requests BOTTLES DRAWN AEROBIC ONLY 3CC  Final   Culture NO GROWTH 5 DAYS  Final   Report Status 03/06/2015 FINAL  Final  Blood Culture (routine x 2)     Status: None   Collection Time: 03/01/15  5:15 PM  Result Value Ref Range Status   Specimen Description BLOOD LEFT ARM  Final   Special Requests BOTTLES DRAWN AEROBIC AND ANAEROBIC 5CC  Final   Culture NO GROWTH 5 DAYS  Final   Report Status 03/06/2015 FINAL  Final  Urine culture     Status: None   Collection Time: 03/01/15  7:25 PM  Result Value Ref Range Status   Specimen Description URINE, CLEAN CATCH  Final   Special Requests NONE  Final   Culture 2,000 COLONIES/mL INSIGNIFICANT GROWTH  Final   Report Status 03/03/2015 FINAL  Final  MRSA PCR Screening     Status: None   Collection Time: 03/04/15  6:40 PM  Result Value Ref Range Status   MRSA by PCR NEGATIVE NEGATIVE Final    Comment:        The GeneXpert MRSA Assay (FDA approved for NASAL specimens only), is one component of a comprehensive MRSA colonization surveillance program. It is not intended to diagnose MRSA infection nor to guide or monitor treatment for MRSA infections.      Studies: No results found. Echo Left ventricle: The cavity size was mildly dilated. Systolic function was normal. The estimated ejection fraction was in the range of 15% to 20%. Severe diffuse hypokinesis. Although no diagnostic regional wall motion abnormality was identified, this possibility cannot be completely excluded on the basis of this study. - Aortic valve: There was mild regurgitation. - Mitral valve: There was moderate regurgitation. - Left atrium: The atrium was moderately dilated. - Right atrium: The atrium was moderately dilated. - Tricuspid valve: There was moderate regurgitation. - Pulmonary arteries: Systolic pressure was moderately increased. - Pericardium, extracardiac: A small pericardial effusion was identified not hemodynamically signiticant..  Difficult to tell if there is a loculated posterior effusion or if it is pleural. There was a left pleural effusion.  Scheduled Meds: . acyclovir  800 mg Oral BID  . amiodarone  150 mg Intravenous Once  . digoxin  0.0625 mg Oral Daily  . donepezil  5 mg Oral Daily  . furosemide  40 mg Oral Daily  . insulin aspart  0-15 Units Subcutaneous TID WC  . insulin detemir  8 Units Subcutaneous QHS  . ivabradine  5 mg Oral BID WC  . pantoprazole  80 mg Oral Daily  . sodium chloride  10-40 mL Intracatheter Q12H  . sodium  chloride  3 mL Intravenous Q12H  . spironolactone  12.5 mg Oral Daily  . venlafaxine XR  75 mg Oral Q breakfast   Continuous Infusions: . sodium chloride    . amiodarone 30 mg/hr (03/10/15 0746)  . heparin 900 Units/hr (03/10/15 0526)    Time spent: 25 minutes  Annalese Stiner  [facility]  [email or web address], password [facility] 03/10/2015, 8:02 AM  LOS: 9 days

## 2015-03-10 NOTE — Progress Notes (Signed)
ANTICOAGULATION CONSULT NOTE - Follow Up Consult  Pharmacy Consult for Heparin Indication: atrial fibrillation  Allergies  Allergen Reactions  . Penicillins Diarrhea  . Shellfish Allergy     migraines    Patient Measurements: Height: 5\' 7"  (170.2 cm) Weight: 139 lb 12.4 oz (63.4 kg) IBW/kg (Calculated) : 61.6  Vital Signs: Temp: 98.2 F (36.8 C) (07/01 1231) Temp Source: Oral (07/01 1231) BP: 91/42 mmHg (07/01 1231) Pulse Rate: 98 (07/01 1231)  Labs:  Recent Labs  03/08/15 0455 03/09/15 0544 03/09/15 1020 03/09/15 1807 03/10/15 0430 03/10/15 1227  HGB 10.3* 9.5*  --   --  9.1*  --   HCT 29.9* 27.6*  --   --  26.9*  --   PLT 43* 45*  --   --  46*  --   HEPARINUNFRC  --   --   --  0.48 0.63 0.43  CREATININE 1.04* 0.96 1.00  --  0.99  --     Estimated Creatinine Clearance: 54.4 mL/min (by C-G formula based on Cr of 0.99).  Medications: Heparin @ 900 units/hr  Assessment: 64yof with new onset afib on 6/29. She was started on amiodarone drip and converted to aflutter. She is not a good candidate for long term anticoagulation given significant thrombocytopenia (due to NHL) and fall risk. She was started on IV heparin yesterday anticipating DCCV today, however, she converted to NSR. Dr. Aundra Dubin still wants to continue the heparin peri-conversion. Heparin level is therapeutic. Aiming for a lower goal due to thrombocytopenia - platelets low but stable at 46. No bleeding reported.  Goal of Therapy:  Heparin level 0.3-0.5 Monitor platelets by anticoagulation protocol: Yes   Plan:  1) Continue heparin at 900 units/hr 2) Follow up heparin level, CBC in AM  Deboraha Sprang 03/10/2015,1:12 PM

## 2015-03-10 NOTE — Progress Notes (Signed)
Victoria Wood is sleeping and daughter at bedside says she did not sleep at all last night and was awake for 36 hours. She is happy to see her resting now. However she was able to convert to sinus rhythm overnight. No concerns or questions today. Support provided.   Vinie Sill, NP Palliative Medicine Team Pager # [phone] (M-F 8a-5p) Team Phone # [phone] (Nights/Weekends)

## 2015-03-11 ENCOUNTER — Other Ambulatory Visit: Payer: Self-pay | Admitting: Pharmacist Clinician (PhC)/ Clinical Pharmacy Specialist

## 2015-03-11 DIAGNOSIS — D61818 Other pancytopenia: Secondary | ICD-10-CM

## 2015-03-11 DIAGNOSIS — N189 Chronic kidney disease, unspecified: Secondary | ICD-10-CM

## 2015-03-11 DIAGNOSIS — E1122 Type 2 diabetes mellitus with diabetic chronic kidney disease: Secondary | ICD-10-CM

## 2015-03-11 DIAGNOSIS — Z9481 Bone marrow transplant status: Secondary | ICD-10-CM

## 2015-03-11 DIAGNOSIS — J219 Acute bronchiolitis, unspecified: Secondary | ICD-10-CM

## 2015-03-11 DIAGNOSIS — C8590 Non-Hodgkin lymphoma, unspecified, unspecified site: Secondary | ICD-10-CM

## 2015-03-11 DIAGNOSIS — K219 Gastro-esophageal reflux disease without esophagitis: Secondary | ICD-10-CM

## 2015-03-11 DIAGNOSIS — I481 Persistent atrial fibrillation: Secondary | ICD-10-CM

## 2015-03-11 LAB — CARBOXYHEMOGLOBIN
Carboxyhemoglobin: 2 % — ABNORMAL HIGH (ref 0.5–1.5)
METHEMOGLOBIN: 1 % (ref 0.0–1.5)
O2 Saturation: 93.6 %
TOTAL HEMOGLOBIN: 8.6 g/dL — AB (ref 12.0–16.0)

## 2015-03-11 LAB — BASIC METABOLIC PANEL
Anion gap: 7 (ref 5–15)
BUN: 16 mg/dL (ref 6–20)
CHLORIDE: 101 mmol/L (ref 101–111)
CO2: 30 mmol/L (ref 22–32)
CREATININE: 1.13 mg/dL — AB (ref 0.44–1.00)
Calcium: 8.7 mg/dL — ABNORMAL LOW (ref 8.9–10.3)
GFR, EST AFRICAN AMERICAN: 57 mL/min — AB (ref >60)
GFR, EST NON AFRICAN AMERICAN: 50 mL/min — AB (ref >60)
GLUCOSE: 114 mg/dL — AB (ref 65–99)
POTASSIUM: 3.4 mmol/L — AB (ref 3.5–5.1)
Sodium: 138 mmol/L (ref 135–145)

## 2015-03-11 LAB — CBC
HCT: 26 % — ABNORMAL LOW (ref 36.0–46.0)
Hemoglobin: 8.8 g/dL — ABNORMAL LOW (ref 12.0–15.0)
MCH: 35.6 pg — ABNORMAL HIGH (ref 26.0–34.0)
MCHC: 33.8 g/dL (ref 30.0–36.0)
MCV: 105.3 fL — ABNORMAL HIGH (ref 78.0–100.0)
Platelets: 44 10*3/uL — ABNORMAL LOW (ref 150–400)
RBC: 2.47 MIL/uL — AB (ref 3.87–5.11)
RDW: 18.9 % — AB (ref 11.5–15.5)
WBC: 2.8 10*3/uL — AB (ref 4.0–10.5)

## 2015-03-11 LAB — GLUCOSE, CAPILLARY
Glucose-Capillary: 111 mg/dL — ABNORMAL HIGH (ref 65–99)
Glucose-Capillary: 220 mg/dL — ABNORMAL HIGH (ref 65–99)

## 2015-03-11 LAB — HEPARIN LEVEL (UNFRACTIONATED): Heparin Unfractionated: 0.56 IU/mL (ref 0.30–0.70)

## 2015-03-11 MED ORDER — GUAIFENESIN ER 600 MG PO TB12: 600.0000 mg | ORAL_TABLET | Freq: Two times a day (BID) | ORAL | Status: DC | PRN

## 2015-03-11 MED ORDER — INSULIN DETEMIR 100 UNIT/ML ~~LOC~~ SOLN: 8.0000 [IU] | Freq: Every day | SUBCUTANEOUS | Status: DC

## 2015-03-11 MED ORDER — POTASSIUM CHLORIDE ER 10 MEQ PO TBCR: 10.0000 meq | EXTENDED_RELEASE_TABLET | Freq: Every day | ORAL | Status: DC

## 2015-03-11 MED ORDER — AMIODARONE HCL 200 MG PO TABS: 200.0000 mg | ORAL_TABLET | Freq: Two times a day (BID) | ORAL | Status: DC

## 2015-03-11 MED ORDER — FUROSEMIDE 40 MG PO TABS: 40.0000 mg | ORAL_TABLET | Freq: Every day | ORAL | Status: DC

## 2015-03-11 MED ORDER — HEPARIN SOD (PORK) LOCK FLUSH 100 UNIT/ML IV SOLN
500.0000 [IU] | INTRAVENOUS | Status: AC | PRN
Start: 2015-03-11 — End: 2015-03-11
  Administered 2015-03-11: 500 [IU]

## 2015-03-11 MED ORDER — POTASSIUM CHLORIDE CRYS ER 20 MEQ PO TBCR
40.0000 meq | EXTENDED_RELEASE_TABLET | Freq: Once | ORAL | Status: AC
  Administered 2015-03-11: 40 meq via ORAL
  Filled 2015-03-11: qty 2

## 2015-03-11 MED ORDER — IVABRADINE HCL 5 MG PO TABS: 5.0000 mg | ORAL_TABLET | Freq: Two times a day (BID) | ORAL | Status: DC

## 2015-03-11 MED ORDER — INSULIN DETEMIR 100 UNIT/ML ~~LOC~~ SOLN: 10.0000 [IU] | Freq: Every day | SUBCUTANEOUS | Status: DC

## 2015-03-11 MED ORDER — AMIODARONE HCL 200 MG PO TABS
200.0000 mg | ORAL_TABLET | Freq: Two times a day (BID) | ORAL | Status: DC
  Administered 2015-03-11: 200 mg via ORAL
  Filled 2015-03-11 (×2): qty 1

## 2015-03-11 MED ORDER — DIGOXIN 62.5 MCG PO TABS: 1.0000 | ORAL_TABLET | Freq: Every day | ORAL | Status: DC

## 2015-03-11 NOTE — Discharge Summary (Signed)
Physician Discharge Summary   Patient ID: Victoria Wood MRN: [id number] DOB/AGE: October 08, 1950 64 y.o.  Admit date: 03/01/2015 Discharge date: 03/11/2015  Primary Care Physician:  Vicenta Aly, [place]  Discharge Diagnoses:    .  Acute bronchiolitis   Acute systolic CHF     Atrial fibrillation with RVR  . GERD (gastroesophageal reflux disease) . HTN (hypertension) . Non-Hodgkin's lymphoma . Pancytopenia . Dementia without behavioral disturbance  Consults: Cardiology, Dr. Haroldine Laws   Recommendations for Outpatient Follow-up:  The patient recommended to follow with cardiology, BMET in one week  Started on amiodarone and digoxin this admission  Not anticoagulation candidate secondary to thrombocytopenia and high fall risk  TESTS THAT NEED FOLLOW-UP BMET   DIET: Carb modified diet    Allergies:   Allergies  Allergen Reactions  . Penicillins Diarrhea  . Shellfish Allergy     migraines     Discharge Medications:   Medication List    STOP taking these medications        fluticasone 50 MCG/ACT nasal spray  Commonly known as:  FLONASE      TAKE these medications        acetaminophen 500 MG tablet  Commonly known as:  TYLENOL  Take 500 mg by mouth every 6 (six) hours as needed for mild pain.     acyclovir 800 MG tablet  Commonly known as:  ZOVIRAX  Take 800 mg by mouth Twice daily.     albuterol 108 (90 BASE) MCG/ACT inhaler  Commonly known as:  PROVENTIL HFA;VENTOLIN HFA  Inhale 2 puffs into the lungs every 6 (six) hours as needed. Wheezing     amiodarone 200 MG tablet  Commonly known as:  PACERONE  Take 1 tablet (200 mg total) by mouth 2 (two) times daily.     CLARITIN 10 MG tablet  Generic drug:  loratadine  Take 10 mg by mouth daily.     [employer] FIBER GUMMIES PO  Take 1 tablet by mouth daily.     Digoxin 62.5 MCG Tabs  Take 1 tablet by mouth daily.     donepezil 5 MG tablet  Commonly known as:  ARICEPT  Take 5 mg by mouth daily.     furosemide  40 MG tablet  Commonly known as:  LASIX  Take 1 tablet (40 mg total) by mouth daily.     guaiFENesin 600 MG 12 hr tablet  Commonly known as:  MUCINEX  Take 1 tablet (600 mg total) by mouth 2 (two) times daily as needed for cough.     insulin detemir 100 UNIT/ML injection  Commonly known as:  LEVEMIR  Inject 0.1 mLs (10 Units total) into the skin at bedtime.     ivabradine 5 MG Tabs tablet  Commonly known as:  CORLANOR  Take 1 tablet (5 mg total) by mouth 2 (two) times daily with a meal.     MELATONIN PO  Take 1 tablet by mouth at bedtime.     methylcellulose 1 % ophthalmic solution  Commonly known as:  ARTIFICIAL TEARS  Place 1 drop into both eyes daily as needed. dryness     omeprazole 40 MG capsule  Commonly known as:  PRILOSEC  Take 40 mg by mouth daily.     potassium chloride 10 MEQ tablet  Commonly known as:  K-DUR  Take 1 tablet (10 mEq total) by mouth daily.     senna 8.6 MG Tabs tablet  Commonly known as:  SENOKOT  Take 2 tablets by mouth at  bedtime. Constipation     SLOW-MAG PO  Take 1 tablet by mouth daily.     venlafaxine XR 75 MG 24 hr capsule  Commonly known as:  EFFEXOR-XR  Take 75 mg by mouth daily with breakfast.         Brief H and P: For complete details please refer to admission H and P, but in brief 64 year old white female with history of non-Hodgkin's lymphoma requiring multiple rounds of chemotherapy, bone marrow transplant, most of her recent therapy in [place], presented with dyspnea. 2D echocardiogram showed new Severe systolic heart failure. Cardiology consulted when patient transferred to stepdown on 6/25 for PICC line and inotropic therapy. Palliative medicine also consulted. 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 With dementia  and non-Hodgkin's lymphoma as well as poor functional status do not think she would be a good LVAD candidate. - 2-D echo showed EF of 15-20%, possibly due to chemotherapy related - Cardiology was consulted and patient was followed closely by CHF team. Overall prognosis poor given the severity of cardiomyopathy, lymphoma, dementia, poor performance status Patient was placed on diuresis with Lasix and Aldactone, negative balance of 11.5 L. BP somewhat soft at the time of discharge hands spironolactone was discontinued. Per cardiology, DC on Lasix 40 mg daily, potassium replacement. Please obtain BMET in one week  Atrial fibrillation with RVR - patient converted to normal sinus rhythm after amiodarone drip was started. Per cardiology recommendation discharged on amiodarone 200 mg twice a day, digoxin 0.0625 mg daily patient was placed on heparin drip, cardiology do not plan on continuing anticoagulation upon discharge given thrombocytopenia and High fall risk   Non-Hodgkin's lymphoma - on Rituxan and Bendamustine in [place] care was also consulted during hospitalization   Pancytopenia with thrombocytopenia - Platelets 44K, no bleeding   GERD (gastroesophageal reflux disease) Continue Protonix   hypotension with history of HTN (hypertension) - Currently asymptomatic however BP borderline low,  Aldactone was discontinued at the time of discharge.    DM type 2 (diabetes mellitus, type 2) - Blood sugars controlled, continue Levemir   Dementia - Currently stable, slept well overnight, family member at bedside  Home health nurse, PT OT will be arranged prior to discharge   Day of Discharge BP 94/56 mmHg  Pulse 84  Temp(Src) 98.2 F (36.8 C) (Oral)  Resp 22  Ht 5\' 7"  (1.702 m)  Wt 65.8 kg (145 lb 1 oz)  BMI 22.71 kg/m2  SpO2 100%  Physical Exam: General: Alert and awake oriented x3 not in any acute distress. HEENT: anicteric sclera, pupils reactive to light and  accommodation CVS: S1-S2 clear no murmur rubs or gallops Chest:Decreased breath sounds at the bases  Abdomen: soft nontender, nondistended, normal bowel sounds Extremities: no cyanosis, clubbing or edema noted bilaterally Neuro: Cranial nerves II-XII intact, no focal neurological deficits   The results of significant diagnostics from this hospitalization (including imaging, microbiology, ancillary and laboratory) are listed below for reference.    LAB RESULTS: Basic Metabolic Panel:  Recent Labs Lab 03/10/15 0430 03/11/15 0630  NA 140 138  K 4.4 3.4*  CL 104 101  CO2 28 30  GLUCOSE 164* 114*  BUN 18 16  CREATININE 0.99 1.13*  CALCIUM 8.9 8.7*   Liver Function Tests: No results for input(s): AST, ALT, ALKPHOS, BILITOT, PROT, ALBUMIN  in the last 168 hours. No results for input(s): LIPASE, AMYLASE in the last 168 hours. No results for input(s): AMMONIA in the last 168 hours. CBC:  Recent Labs Lab 03/05/15 0500  03/10/15 0430 03/11/15 0630  WBC 2.8*  < > 3.4* 2.8*  NEUTROABS 2.0  --   --   --   HGB 11.1*  < > 9.1* 8.8*  HCT 32.6*  < > 26.9* 26.0*  MCV 104.2*  < > 105.1* 105.3*  PLT 35*  < > 46* 44*  < > = values in this interval not displayed. Cardiac Enzymes: No results for input(s): CKTOTAL, CKMB, CKMBINDEX, TROPONINI in the last 168 hours. BNP: Invalid input(s): POCBNP CBG:  Recent Labs Lab 03/11/15 0735 03/11/15 1110  GLUCAP 111* 220*    Significant Diagnostic Studies:  Dg Chest 2 View  03/01/2015   CLINICAL DATA:  Cough since yesterday. Shortness of breath with exertion. History of hypertension, asthma.  EXAM: CHEST  2 VIEW  COMPARISON:  05/19/2012  FINDINGS: Right Port-A-Cath remains in place, unchanged. Postsurgical changes in the left breast and axilla. Heart is normal size. Bibasilar opacities likely reflect atelectasis. No effusions. No acute bony abnormality. Mild rightward scoliosis in the thoracolumbar spine.  IMPRESSION: Bibasilar atelectasis.    Electronically Signed   By: Rolm Baptise M.D.   On: 03/01/2015 14:59   Nm Pulmonary Perf And Vent  03/01/2015   CLINICAL DATA:  Shortness of Breath  EXAM: NUCLEAR MEDICINE VENTILATION - PERFUSION LUNG SCAN  TECHNIQUE: Ventilation images were obtained in multiple projections using inhaled aerosol Tc-48m DTPA. Perfusion images were obtained in multiple projections after intravenous injection of Tc-49m MAA.  RADIOPHARMACEUTICALS:  Forty mCi Technetium-51m DTPA aerosol inhalation and 6 Technetium-83m MAA IV  COMPARISON:  Chest x-ray same day  FINDINGS: Ventilation: No focal ventilation defect.  Perfusion: No wedge shaped peripheral perfusion defects to suggest acute pulmonary embolism.  IMPRESSION: Low probability for pulmonary embolus.   Electronically Signed   By: Lahoma Crocker M.D.   On: 03/01/2015 22:18   Dg Chest Port 1 View  03/02/2015   CLINICAL DATA:  Acute onset of shortness of breath. Initial encounter.  EXAM: PORTABLE CHEST - 1 VIEW  COMPARISON:  Chest radiograph performed earlier today at 2:41 p.m.  FINDINGS: The lungs are mildly hypoexpanded. Vascular congestion is noted. Mildly increased interstitial markings may reflect minimal interstitial edema. There is no evidence of pleural effusion or pneumothorax.  The cardiomediastinal silhouette is borderline normal in size. A right-sided chest port is noted ending about the mid SVC. Clips are seen overlying the left axilla. No acute osseous abnormalities are seen.  IMPRESSION: Lungs mildly hypoexpanded. Vascular congestion noted. Mildly increased interstitial markings may reflect minimal interstitial edema.   Electronically Signed   By: Garald Balding M.D.   On: 03/02/2015 00:04    2D ECHO: Study Conclusions  - Left ventricle: The cavity size was mildly dilated. Systolic function was normal. The estimated ejection fraction was in the range of 15% to 20%. Severe diffuse hypokinesis. Although no diagnostic regional wall motion abnormality was  identified, this possibility cannot be completely excluded on the basis of this study. - Aortic valve: There was mild regurgitation. - Mitral valve: There was moderate regurgitation. - Left atrium: The atrium was moderately dilated. - Right atrium: The atrium was moderately dilated. - Tricuspid valve: There was moderate regurgitation. - Pulmonary arteries: Systolic pressure was moderately increased. - Pericardium, extracardiac: A small pericardial effusion was identified not hemodynamically signiticant.. Difficult to  tell if there is a loculated posterior effusion or if it is pleural. There was a left pleural effusion.   Disposition and Follow-up: Discharge Instructions    (HEART FAILURE PATIENTS) Call MD:  Anytime you have any of the following symptoms: 1) 3 pound weight gain in 24 hours or 5 pounds in 1 week 2) shortness of breath, with or without a dry hacking cough 3) swelling in the hands, feet or stomach 4) if you have to sleep on extra pillows at night in order to breathe.    Complete by:  As directed      Diet Carb Modified    Complete by:  As directed      Discharge instructions    Complete by:  As directed   Lasix 40mg  daily (hold if weight < 135 pounds)     Increase activity slowly    Complete by:  As directed             DISPOSITION:Home with home health   DISCHARGE FOLLOW-UP     Follow-up Information    Follow up with Bensimhon, Daniel, MD. Schedule an appointment as soon as possible for a visit in 10 days.   Specialty:  Cardiology   Why:  for hospital follow-up   Contact information:   [place] [id number] [phone]       Follow up with Vicenta Aly, [place]. Schedule an appointment as soon as possible for a visit in 10 days.   Specialty:  Nurse Practitioner   Why:  for hospital follow-up   Contact information:   [place] [address] [phone]        Time spent on  Discharge: 40 mins  Signed:   RAI,RIPUDEEP M.D. [facility] 03/11/2015, 12:09 PM Pager: [phone]

## 2015-03-11 NOTE — Progress Notes (Signed)
RN discussed discharge instructions with patient and patient's husband. Both verbalized understanding. Patient has been seen by case management and discharge services completed. PICC line removed and port deaccessed per IV team. Vital signs stable. Patient transported by RN with husband and family members to main exit.

## 2015-03-11 NOTE — Care Management Note (Addendum)
Case Management Note  Patient Details  Name: Pleasant Britz MRN: [id number] Date of Birth: 1950/10/13  Subjective/Objective:                   Acute bronchiolitis  Acute systolic CHF   Atrial fibrillation with RVR  . GERD (gastroesophageal reflux disease) Action/Plan:  discharge planning Expected Discharge Date:  03/11/15               Expected Discharge Plan:  [place]  In-House Referral:     Discharge planning Services  CM Consult  Post Acute Care Choice:    Choice offered to:     DME Arranged:  3-N-1, Walker rolling DME Agency:     HH Arranged:  RN, PT, Nurse's Aide, OT [place] Agency:  [facility]  Status of Service:  Completed, signed off  Medicare Important Message Given:  Yes-third notification given Date Medicare IM Given:  03/03/15 Medicare IM give by:  Tomi Bamberger RN Date Additional Medicare IM Given:    Additional Medicare Important Message give by:     If discussed at [place] of Stay Meetings, dates discussed:    Additional Comments: CM met with pt and cousin and spoke with pt's spouse on the phone and they have changed their mind for [facility] agency and have chosen [facility] to render HHPT/OT/RN/Aide.  Referral called [facility] rep, Tiffany with appropriate address: [address], [place] [address] and contact is [phone].   CM called [facility] DME rep, Jeneen Rinks to please deliver the 3n1 and rolling walker to room prior to discharge today.  No other CM needs were communicated. Dellie Catholic, RN 03/11/2015, 1:49 PM

## 2015-03-11 NOTE — Progress Notes (Signed)
ANTICOAGULATION CONSULT NOTE - Follow Up Consult  Pharmacy Consult for Heparin Indication: atrial fibrillation  Allergies  Allergen Reactions  . Penicillins Diarrhea  . Shellfish Allergy     migraines    Patient Measurements: Height: 5\' 7"  (170.2 cm) Weight: 145 lb 1 oz (65.8 kg) IBW/kg (Calculated) : 61.6  Vital Signs: Temp: 98.3 F (36.8 C) (07/02 0738) Temp Source: Oral (07/02 0738) BP: 101/63 mmHg (07/02 0800) Pulse Rate: 92 (07/02 0800)  Labs:  Recent Labs  03/09/15 0544 03/09/15 1020  03/10/15 0430 03/10/15 1227 03/11/15 0630  HGB 9.5*  --   --  9.1*  --  8.8*  HCT 27.6*  --   --  26.9*  --  26.0*  PLT 45*  --   --  46*  --  44*  HEPARINUNFRC  --   --   < > 0.63 0.43 0.56  CREATININE 0.96 1.00  --  0.99  --  1.13*  < > = values in this interval not displayed.  Estimated Creatinine Clearance: 47.6 mL/min (by C-G formula based on Cr of 1.13).  Medications: Heparin @ 900 units/hr  Assessment: 64yof with new onset afib on 6/29. She was started on amiodarone drip and converted to aflutter. She is not a good candidate for long term anticoagulation given significant thrombocytopenia (due to NHL) and fall risk. She was started on IV heparin yesterday anticipating DCCV today, however, she converted to NSR. Dr. Aundra Dubin still wants to continue the heparin peri-conversion. Heparin level is slightly supra-therapeutic. Aiming for a lower goal due to thrombocytopenia - platelets low but stable at 46. No bleeding reported.  Goal of Therapy:  Heparin level 0.3-0.5 Monitor platelets by anticoagulation protocol: Yes   Plan:  1) Heparin to 800 units / hr 2) Follow up heparin level, CBC in AM  Tad Moore 03/11/2015,10:12 AM

## 2015-03-11 NOTE — Progress Notes (Addendum)
Patient ID: Victoria Wood, female   DOB: February 13, 1951, 64 y.o.   MRN: [id number]    64 yo with long history of non-Hodgkin's lymphoma (since age 60) with multiple rounds of chemo and history of autologous bone marrow transplant was admitted with CHF.  She lives in [place] now and has her oncology care down there.  She was up visiting her daughter.  She has no history of cardiac problems.  She was seen by a cardiologist in 12/15 prior to R-CHOP (doxorubicin-containing chemo).  She had R-CHOP and then Rituxan/bendamustine more recently, which has been completed.  She had a PET scan not long ago, and she was told that her cancer is non-active.  Husband says that she has been told when she goes for infusions, etc, that her HR is high and her BP is low for months now.  She has had exertional dyspnea as well for several months.  She was admitted after her daughter noted her wheezing and she was taken to a primary MD and found to be hypoxic.  Also of note, she has had signs of dementia that have been progressive for 2-3 years.  She is on Aricept.   Echo this admission showed EF 15-20%, diffuse hypokinesis, moderate MR, mild AI.   She went in to atrial fibrillation with RVR early am 6/29.  She was started on amiodarone gtt. Now back in NSR x 2 days. Remains on IV amio and heparin  K 3.4  She feels better.  Only complaint is stomach ache. Had good BM though. CVP 4-5.  Co-ox does not appear accurate.   Scheduled Meds: . acyclovir  800 mg Oral BID  . amiodarone  150 mg Intravenous Once  . digoxin  0.0625 mg Oral Daily  . donepezil  5 mg Oral Daily  . furosemide  40 mg Oral Daily  . insulin aspart  0-15 Units Subcutaneous TID WC  . insulin detemir  8 Units Subcutaneous QHS  . ivabradine  5 mg Oral BID WC  . pantoprazole  80 mg Oral Daily  . potassium chloride  40 mEq Oral Once  . sodium chloride  10-40 mL Intracatheter Q12H  . sodium chloride  3 mL Intravenous Q12H  . spironolactone  12.5 mg Oral Daily  .  venlafaxine XR  75 mg Oral Q breakfast   Continuous Infusions: . sodium chloride    . amiodarone 30 mg/hr (03/11/15 0605)  . heparin 800 Units/hr (03/11/15 1013)   PRN Meds:.guaiFENesin, levalbuterol, menthol-cetylpyridinium, sodium chloride, sodium chloride, sodium chloride, temazepam   Filed Vitals:   03/11/15 0500 03/11/15 0738 03/11/15 0800 03/11/15 1012  BP:  96/54 101/63   Pulse:  85 92 88  Temp:  98.3 F (36.8 C)    TempSrc:  Oral    Resp:  20 18   Height:      Weight: 65.8 kg (145 lb 1 oz)     SpO2:  100% 100%     Intake/Output Summary (Last 24 hours) at 03/11/15 1125 Last data filed at 03/11/15 1000  Gross per 24 hour  Intake 1271.7 ml  Output   2650 ml  Net -1378.3 ml    LABS: Basic Metabolic Panel:  Recent Labs  03/10/15 0430 03/11/15 0630  NA 140 138  K 4.4 3.4*  CL 104 101  CO2 28 30  GLUCOSE 164* 114*  BUN 18 16  CREATININE 0.99 1.13*  CALCIUM 8.9 8.7*   Liver Function Tests: No results for input(s): AST, ALT, ALKPHOS, BILITOT,  PROT, ALBUMIN in the last 72 hours. No results for input(s): LIPASE, AMYLASE in the last 72 hours. CBC:  Recent Labs  03/10/15 0430 03/11/15 0630  WBC 3.4* 2.8*  HGB 9.1* 8.8*  HCT 26.9* 26.0*  MCV 105.1* 105.3*  PLT 46* 44*   Cardiac Enzymes: No results for input(s): CKTOTAL, CKMB, CKMBINDEX, TROPONINI in the last 72 hours. BNP: Invalid input(s): POCBNP D-Dimer: No results for input(s): DDIMER in the last 72 hours. Hemoglobin A1C: No results for input(s): HGBA1C in the last 72 hours. Fasting Lipid Panel: No results for input(s): CHOL, HDL, LDLCALC, TRIG, CHOLHDL, LDLDIRECT in the last 72 hours. Thyroid Function Tests: No results for input(s): TSH, T4TOTAL, T3FREE, THYROIDAB in the last 72 hours.  Invalid input(s): FREET3 Anemia Panel: No results for input(s): VITAMINB12, FOLATE, FERRITIN, TIBC, IRON, RETICCTPCT in the last 72 hours.  RADIOLOGY: Dg Chest 2 View  03/01/2015   CLINICAL DATA:  Cough  since yesterday. Shortness of breath with exertion. History of hypertension, asthma.  EXAM: CHEST  2 VIEW  COMPARISON:  05/19/2012  FINDINGS: Right Port-A-Cath remains in place, unchanged. Postsurgical changes in the left breast and axilla. Heart is normal size. Bibasilar opacities likely reflect atelectasis. No effusions. No acute bony abnormality. Mild rightward scoliosis in the thoracolumbar spine.  IMPRESSION: Bibasilar atelectasis.   Electronically Signed   By: Rolm Baptise M.D.   On: 03/01/2015 14:59   Nm Pulmonary Perf And Vent  03/01/2015   CLINICAL DATA:  Shortness of Breath  EXAM: NUCLEAR MEDICINE VENTILATION - PERFUSION LUNG SCAN  TECHNIQUE: Ventilation images were obtained in multiple projections using inhaled aerosol Tc-49m DTPA. Perfusion images were obtained in multiple projections after intravenous injection of Tc-2m MAA.  RADIOPHARMACEUTICALS:  Forty mCi Technetium-98m DTPA aerosol inhalation and 6 Technetium-60m MAA IV  COMPARISON:  Chest x-ray same day  FINDINGS: Ventilation: No focal ventilation defect.  Perfusion: No wedge shaped peripheral perfusion defects to suggest acute pulmonary embolism.  IMPRESSION: Low probability for pulmonary embolus.   Electronically Signed   By: Lahoma Crocker M.D.   On: 03/01/2015 22:18   Dg Chest Port 1 View  03/04/2015   CLINICAL DATA:  CHF.  EXAM: PORTABLE CHEST - 1 VIEW  COMPARISON:  03/01/2015  FINDINGS: Right IJ Port-A-Cath unchanged. Examination demonstrates the lungs to be hypoinflated with worsening left base opacification likely a small effusion with associated atelectasis. Possible small amount right pleural fluid. Evidence of persistent mild vascular congestion. Borderline stable cardiomegaly. Remainder of the exam is unchanged.  IMPRESSION: Worsening left base opacification likely small effusion with atelectasis. Possible small amount right pleural fluid. Persistent mild vascular congestion.   Electronically Signed   By: Marin Olp M.D.   On:  03/04/2015 10:36   Dg Chest Port 1 View  03/02/2015   CLINICAL DATA:  Acute onset of shortness of breath. Initial encounter.  EXAM: PORTABLE CHEST - 1 VIEW  COMPARISON:  Chest radiograph performed earlier today at 2:41 p.m.  FINDINGS: The lungs are mildly hypoexpanded. Vascular congestion is noted. Mildly increased interstitial markings may reflect minimal interstitial edema. There is no evidence of pleural effusion or pneumothorax.  The cardiomediastinal silhouette is borderline normal in size. A right-sided chest port is noted ending about the mid SVC. Clips are seen overlying the left axilla. No acute osseous abnormalities are seen.  IMPRESSION: Lungs mildly hypoexpanded. Vascular congestion noted. Mildly increased interstitial markings may reflect minimal interstitial edema.   Electronically Signed   By: Garald Balding M.D.   On:  03/02/2015 00:04    PHYSICAL EXAM General: NAD Neck: JVP flat cm, no thyromegaly or thyroid nodule.  Lungs: Decreased breath sounds at bases bilaterally. CV: Lateral PMI.  Heart regular  S1/S2, no S3, 2/6 HSM apex.  No peripheral edema.   Abdomen: Soft, nontender, no hepatosplenomegaly, no distention.  Neurologic: Awake/alert, conversant Psych: Normal affect. Extremities: No clubbing or cyanosis.   TELEMETRY: NSR  ASSESSMENT AND PLAN: 64 yo with history of NHL, dementia, and thrombocytopenia was admitted with acute systolic CHF.  EF 15-20%, no prior history of heart problems.  No chest pain but she has been progressively dyspneic with tachycardia and low blood pressure since she had R-CHOP chemotherapy this winter (containing doxorubicin).  1. Acute systolic CHF: EF 70-62% by echo with moderate MR.  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  Symptoms have been progressive for several months and HR has been high/BP low for a while as well.  She has diuresed well in the hospital, BP has been soft.  CVP 8 today, co-ox not done.  She is on digoxin and  spironolactone.   - Continue digoxin and spironolactone.     - CVP 4-5, continue po Lasix 40 mg daily.     - continue to hold Coreg and ACEI with low BP.   - Back onCorlanor 5 mg bid now that she is back in NSR.  - We have had a discussion about what to expect here. 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  She would not be a transplant candidate.  With her dementia and NHL as well as questionable functional status, I do not think that she would be a good LVAD candidate.   2. Atrial fibrillation/flutter:  New onset 6/29.  She is on digoxin.  Limited options for rate control with soft BP. She converted back to NSR on 6/30 with amio. Will switch to 200 po bid.    She is a poor long-term anticoagulation candidate with significant thrombocytopenia and fairly high fall risk.   3. NHL: Completed most recent chemo regimen.  Per husband, had PET scan showing that disease is "non-active."   4. Dementia: x several years.  She is on Aricept.   5. Thrombocytopenia: Chronic,   She looks good. Very stable from HF standpoint. BP on low end so will stop spiro. Can go home today or tomorrow. I have talked to her husband and he would like to have her come home today if we can get home care services set up otherwise tomorrow  HF d/c meds  Lasix 40mg  daily (hold weight < 135 pounds) Amiodarone 200 bid Corlanor 5 bid Digoxin 0.0625 mg daily  Will need f/u in HF Clinic.   Glori Bickers MD 03/11/2015 11:25 AM

## 2015-03-11 NOTE — Progress Notes (Signed)
[facility]                                                                              Patient Demographics  Victoria Wood, is a 64 y.o. female, DOB - [birth year], HWE:[id number]  Admit date - 03/01/2015   Admitting Physician Lavina Hamman, MD  Outpatient Primary MD for the patient is ANDERSON,Kiannah, [place]  LOS - 10   Chief Complaint  Patient presents with  . Cough       Brief HPI   64 year old white female with history of non-Hodgkin's lymphoma requiring multiple rounds of chemotherapy, bone marrow transplant, most of her recent therapy in [place], presented with dyspnea. 2D echocardiogram showed new Severe systolic heart failure. Cardiology consulted when patient transferred to stepdown on 6/25 for PICC line and inotropic therapy. Palliative medicine also consulted. Hospital stay complicated with a fib with RVR-- converted to sinus on 6/30 with amiodarone   Assessment & Plan    Principal Problem:   Acute systolic heart failure - 2-D echo showed EF of 15-20%, possibly due to chemotherapy related - CHF team following, overall prognosis poor given the severity of cardiomyopathy, lymphoma, dementia, poor performance status - Continue strict I's and O's and daily weights, negative balance of 11.5 L, continue daily Lasix and spironolactone. BP somewhat borderline low, will follow cardiology recommendations hold the spironolactone?   Active Problems: Atrial fibrillation with RVR - Currently on amiodarone drip and digoxin -Currently on heparin drip, cardiology do not plan on continuing  anticoagulation upon discharge given thrombocytopenia and High fall risk    Non-Hodgkin's lymphoma - on Rituxan and Bendamustine in [place] - Palliative care also following  Pancytopenia with thrombocytopenia - Platelets 44K, no bleeding    GERD (gastroesophageal reflux disease) Continue Protonix     hypotension with history of  HTN (hypertension) - Currently asymptomatic  however BP borderline low, on Lasix and spironolactone,  follow cardiology recommendations, ? May hold spironolactone     DM type 2 (diabetes mellitus, type 2) - Blood sugars controlled, continue Levemir and sliding scale insulin    Dementia - Currently stable, slept well overnight, family member at bedside   Code Status: Full code  Family Communication: Discussed in detail with the patient, all imaging results, lab results explained to the patient and son at the bedside    Disposition Plan: Pending cardiology clearance, possibly 24 hours  Time Spent in minutes  47minutes  Procedures  2-D echo  Consults    Cardiology  DVT Prophylaxis heparin  Medications  Scheduled Meds: . acyclovir  800 mg Oral BID  . amiodarone  150 mg Intravenous Once  . digoxin  0.0625 mg Oral Daily  . donepezil  5 mg Oral Daily  . furosemide  40 mg Oral Daily  . insulin aspart  0-15 Units Subcutaneous TID WC  . insulin detemir  8 Units Subcutaneous QHS  . ivabradine  5 mg Oral BID WC  . pantoprazole  80 mg Oral Daily  . sodium chloride  10-40 mL Intracatheter Q12H  . sodium chloride  3 mL Intravenous Q12H  . spironolactone  12.5 mg Oral Daily  . venlafaxine  XR  75 mg Oral Q breakfast   Continuous Infusions: . sodium chloride    . amiodarone 30 mg/hr (03/11/15 0605)  . heparin 800 Units/hr (03/11/15 1013)   PRN Meds:.guaiFENesin, levalbuterol, menthol-cetylpyridinium, sodium chloride, sodium chloride, sodium chloride, temazepam   Antibiotics   Anti-infectives    Start     Dose/Rate Route Frequency Ordered Stop   03/02/15 2200  vancomycin (VANCOCIN) 500 mg in sodium chloride 0.9 % 100 mL IVPB  Status:  Discontinued     500 mg 100 mL/hr over 60 Minutes Intravenous Every 12 hours 03/02/15 1041 03/03/15 0908   03/02/15 1000  acyclovir (ZOVIRAX) tablet 800 mg     800 mg Oral 2 times daily 03/02/15 0201     03/02/15 1000  vancomycin (VANCOCIN) IVPB 750 mg/150 ml premix  Status:  Discontinued      750 mg 150 mL/hr over 60 Minutes Intravenous Every 12 hours 03/02/15 0955 03/02/15 1041   03/02/15 0600  vancomycin (VANCOCIN) IVPB 750 mg/150 ml premix  Status:  Discontinued     750 mg 150 mL/hr over 60 Minutes Intravenous Every 12 hours 03/02/15 0226 03/02/15 0955   03/02/15 0600  levofloxacin (LEVAQUIN) IVPB 750 mg  Status:  Discontinued     750 mg 100 mL/hr over 90 Minutes Intravenous Every 24 hours 03/02/15 0226 03/03/15 0908   03/01/15 1630  piperacillin-tazobactam (ZOSYN) IVPB 3.375 g     3.375 g 100 mL/hr over 30 Minutes Intravenous  Once 03/01/15 1621 03/01/15 1743   03/01/15 1630  vancomycin (VANCOCIN) IVPB 1000 mg/200 mL premix     1,000 mg 200 mL/hr over 60 Minutes Intravenous  Once 03/01/15 1621 03/01/15 1927        Subjective:   Victoria Wood was seen and examined today.  Patient denies dizziness, chest pain, shortness of breath, abdominal pain, N/V/D/C, new weakness, numbess, tingling. No acute events overnight.    Objective:   Blood pressure 101/63, pulse 88, temperature 98.3 F (36.8 C), temperature source Oral, resp. rate 18, height 5\' 7"  (1.702 m), weight 65.8 kg (145 lb 1 oz), SpO2 100 %.  Wt Readings from Last 3 Encounters:  03/11/15 65.8 kg (145 lb 1 oz)  05/21/12 56.473 kg (124 lb 8 oz)  03/11/12 57.97 kg (127 lb 12.8 oz)     Intake/Output Summary (Last 24 hours) at 03/11/15 1034 Last data filed at 03/11/15 1000  Gross per 24 hour  Intake 1057.4 ml  Output   2400 ml  Net -1342.6 ml    Exam  General: Alert and oriented x 3, NAD  HEENT:  PERRLA, EOMI, Anicteric Sclera, mucous membranes moist.   Neck: Supple, no JVD, no masses  CVS: S1 S2 auscultatedregular rhythm  Respiratory :  decreased breath sound at the bases  Abdomen : Soft, nontender, nondistended, + bowel sounds  Ext: no cyanosis clubbing, trace edema  Neuro: AAOx3, Cr N's II- XII. Strength 5/5 upper and lower extremities bilaterally  Skin: No rashes  Psych: Normal affect  and demeanor, alert and oriented x3    Data Review   Micro Results Recent Results (from the past 240 hour(s))  Blood Culture (routine x 2)     Status: None   Collection Time: 03/01/15  5:07 PM  Result Value Ref Range Status   Specimen Description BLOOD RIGHT WRIST  Final   Special Requests BOTTLES DRAWN AEROBIC ONLY 3CC  Final   Culture NO GROWTH 5 DAYS  Final   Report Status 03/06/2015 FINAL  Final  Blood Culture (routine x 2)     Status: None   Collection Time: 03/01/15  5:15 PM  Result Value Ref Range Status   Specimen Description BLOOD LEFT ARM  Final   Special Requests BOTTLES DRAWN AEROBIC AND ANAEROBIC 5CC  Final   Culture NO GROWTH 5 DAYS  Final   Report Status 03/06/2015 FINAL  Final  Urine culture     Status: None   Collection Time: 03/01/15  7:25 PM  Result Value Ref Range Status   Specimen Description URINE, CLEAN CATCH  Final   Special Requests NONE  Final   Culture 2,000 COLONIES/mL INSIGNIFICANT GROWTH  Final   Report Status 03/03/2015 FINAL  Final  MRSA PCR Screening     Status: None   Collection Time: 03/04/15  6:40 PM  Result Value Ref Range Status   MRSA by PCR NEGATIVE NEGATIVE Final    Comment:        The GeneXpert MRSA Assay (FDA approved for NASAL specimens only), is one component of a comprehensive MRSA colonization surveillance program. It is not intended to diagnose MRSA infection nor to guide or monitor treatment for MRSA infections.     Radiology Reports Dg Chest 2 View  03/01/2015   CLINICAL DATA:  Cough since yesterday. Shortness of breath with exertion. History of hypertension, asthma.  EXAM: CHEST  2 VIEW  COMPARISON:  05/19/2012  FINDINGS: Right Port-A-Cath remains in place, unchanged. Postsurgical changes in the left breast and axilla. Heart is normal size. Bibasilar opacities likely reflect atelectasis. No effusions. No acute bony abnormality. Mild rightward scoliosis in the thoracolumbar spine.  IMPRESSION: Bibasilar atelectasis.    Electronically Signed   By: Rolm Baptise M.D.   On: 03/01/2015 14:59   Nm Pulmonary Perf And Vent  03/01/2015   CLINICAL DATA:  Shortness of Breath  EXAM: NUCLEAR MEDICINE VENTILATION - PERFUSION LUNG SCAN  TECHNIQUE: Ventilation images were obtained in multiple projections using inhaled aerosol Tc-30m DTPA. Perfusion images were obtained in multiple projections after intravenous injection of Tc-59m MAA.  RADIOPHARMACEUTICALS:  Forty mCi Technetium-67m DTPA aerosol inhalation and 6 Technetium-70m MAA IV  COMPARISON:  Chest x-ray same day  FINDINGS: Ventilation: No focal ventilation defect.  Perfusion: No wedge shaped peripheral perfusion defects to suggest acute pulmonary embolism.  IMPRESSION: Low probability for pulmonary embolus.   Electronically Signed   By: Lahoma Crocker M.D.   On: 03/01/2015 22:18   Dg Chest Port 1 View  03/04/2015   CLINICAL DATA:  CHF.  EXAM: PORTABLE CHEST - 1 VIEW  COMPARISON:  03/01/2015  FINDINGS: Right IJ Port-A-Cath unchanged. Examination demonstrates the lungs to be hypoinflated with worsening left base opacification likely a small effusion with associated atelectasis. Possible small amount right pleural fluid. Evidence of persistent mild vascular congestion. Borderline stable cardiomegaly. Remainder of the exam is unchanged.  IMPRESSION: Worsening left base opacification likely small effusion with atelectasis. Possible small amount right pleural fluid. Persistent mild vascular congestion.   Electronically Signed   By: Marin Olp M.D.   On: 03/04/2015 10:36   Dg Chest Port 1 View  03/02/2015   CLINICAL DATA:  Acute onset of shortness of breath. Initial encounter.  EXAM: PORTABLE CHEST - 1 VIEW  COMPARISON:  Chest radiograph performed earlier today at 2:41 p.m.  FINDINGS: The lungs are mildly hypoexpanded. Vascular congestion is noted. Mildly increased interstitial markings may reflect minimal interstitial edema. There is no evidence of pleural effusion or pneumothorax.  The  cardiomediastinal silhouette is borderline normal  in size. A right-sided chest port is noted ending about the mid SVC. Clips are seen overlying the left axilla. No acute osseous abnormalities are seen.  IMPRESSION: Lungs mildly hypoexpanded. Vascular congestion noted. Mildly increased interstitial markings may reflect minimal interstitial edema.   Electronically Signed   By: Garald Balding M.D.   On: 03/02/2015 00:04    CBC  Recent Labs Lab 03/05/15 0500  03/07/15 0623 03/08/15 0455 03/09/15 0544 03/10/15 0430 03/11/15 0630  WBC 2.8*  < > 2.8* 3.1* 3.3* 3.4* 2.8*  HGB 11.1*  < > 10.5* 10.3* 9.5* 9.1* 8.8*  HCT 32.6*  < > 30.5* 29.9* 27.6* 26.9* 26.0*  PLT 35*  < > 50* 43* 45* 46* 44*  MCV 104.2*  < > 104.1* 101.7* 104.2* 105.1* 105.3*  MCH 35.5*  < > 35.8* 35.0* 35.8* 35.5* 35.6*  MCHC 34.0  < > 34.4 34.4 34.4 33.8 33.8  RDW 19.3*  < > 18.7* 18.3* 18.5* 18.6* 18.9*  LYMPHSABS 0.4*  --   --   --   --   --   --   MONOABS 0.4  --   --   --   --   --   --   EOSABS 0.1  --   --   --   --   --   --   BASOSABS 0.0  --   --   --   --   --   --   < > = values in this interval not displayed.  Chemistries   Recent Labs Lab 03/08/15 0455 03/09/15 0544 03/09/15 1020 03/10/15 0430 03/11/15 0630  NA 137 140 136 140 138  K 3.5 3.8 4.1 4.4 3.4*  CL 96* 101 99* 104 101  CO2 31 30 28 28 30   GLUCOSE 116* 108* 245* 164* 114*  BUN 25* 16 18 18 16   CREATININE 1.04* 0.96 1.00 0.99 1.13*  CALCIUM 8.9 8.7* 8.5* 8.9 8.7*   ------------------------------------------------------------------------------------------------------------------ estimated creatinine clearance is 47.6 mL/min (by C-G formula based on Cr of 1.13). ------------------------------------------------------------------------------------------------------------------ No results for input(s): HGBA1C in the last 72  hours. ------------------------------------------------------------------------------------------------------------------ No results for input(s): CHOL, HDL, LDLCALC, TRIG, CHOLHDL, LDLDIRECT in the last 72 hours. ------------------------------------------------------------------------------------------------------------------ No results for input(s): TSH, T4TOTAL, T3FREE, THYROIDAB in the last 72 hours.  Invalid input(s): FREET3 ------------------------------------------------------------------------------------------------------------------ No results for input(s): VITAMINB12, FOLATE, FERRITIN, TIBC, IRON, RETICCTPCT in the last 72 hours.  Coagulation profile No results for input(s): INR, PROTIME in the last 168 hours.  No results for input(s): DDIMER in the last 72 hours.  Cardiac Enzymes No results for input(s): CKMB, TROPONINI, MYOGLOBIN in the last 168 hours.  Invalid input(s): CK ------------------------------------------------------------------------------------------------------------------ Invalid input(s): [place]  03/09/15 2139 03/10/15 0739 03/10/15 1229 03/10/15 1614 03/10/15 2156 03/11/15 0735  GLUCAP 226* 141* 225* 155* 162* 111*     Jillene Wehrenberg M.D. [facility] 03/11/2015, 10:34 AM  Pager: [phone]   Between 7am to 7pm - call Pager - [phone]  After 7pm go to [email or web address] - password [email or web address]  Call night coverage person covering after 7pm

## 2015-03-20 ENCOUNTER — Telehealth (HOSPITAL_COMMUNITY): Payer: Self-pay | Admitting: *Deleted

## 2015-03-20 NOTE — Telephone Encounter (Signed)
Completed PA for pt's Corlanor, med approved 12/15/14-03/14/16 with a $100 copay, pt's husband is aware and states they can afford this, advised if they can not to let us know and we can connect them to [employer] network for pt assistance

## 2015-04-11 ENCOUNTER — Ambulatory Visit (HOSPITAL_COMMUNITY)
Admission: RE | Admit: 2015-04-11 | Discharge: 2015-04-11 | Disposition: A | Discharge status: Home / Self Care | Payer: Medicare Other | Source: Ambulatory Visit | Attending: Internal Medicine | Admitting: Internal Medicine

## 2015-04-11 ENCOUNTER — Encounter (HOSPITAL_COMMUNITY): Payer: Self-pay

## 2015-04-11 VITALS — BP 94/56 | HR 77 | Wt 132.0 lb

## 2015-04-11 DIAGNOSIS — I429 Cardiomyopathy, unspecified: Secondary | ICD-10-CM | POA: Insufficient documentation

## 2015-04-11 DIAGNOSIS — Z9489 Other transplanted organ and tissue status: Secondary | ICD-10-CM | POA: Insufficient documentation

## 2015-04-11 DIAGNOSIS — E785 Hyperlipidemia, unspecified: Secondary | ICD-10-CM | POA: Diagnosis not present

## 2015-04-11 DIAGNOSIS — I4892 Unspecified atrial flutter: Secondary | ICD-10-CM | POA: Insufficient documentation

## 2015-04-11 DIAGNOSIS — Z87891 Personal history of nicotine dependence: Secondary | ICD-10-CM | POA: Diagnosis not present

## 2015-04-11 DIAGNOSIS — F0390 Unspecified dementia without behavioral disturbance: Secondary | ICD-10-CM | POA: Diagnosis not present

## 2015-04-11 DIAGNOSIS — Z9221 Personal history of antineoplastic chemotherapy: Secondary | ICD-10-CM | POA: Insufficient documentation

## 2015-04-11 DIAGNOSIS — I480 Paroxysmal atrial fibrillation: Secondary | ICD-10-CM | POA: Diagnosis not present

## 2015-04-11 DIAGNOSIS — I5021 Acute systolic (congestive) heart failure: Secondary | ICD-10-CM

## 2015-04-11 DIAGNOSIS — I5022 Chronic systolic (congestive) heart failure: Secondary | ICD-10-CM | POA: Insufficient documentation

## 2015-04-11 DIAGNOSIS — D696 Thrombocytopenia, unspecified: Secondary | ICD-10-CM | POA: Diagnosis not present

## 2015-04-11 DIAGNOSIS — E119 Type 2 diabetes mellitus without complications: Secondary | ICD-10-CM | POA: Diagnosis not present

## 2015-04-11 DIAGNOSIS — K219 Gastro-esophageal reflux disease without esophagitis: Secondary | ICD-10-CM | POA: Diagnosis not present

## 2015-04-11 DIAGNOSIS — Z79899 Other long term (current) drug therapy: Secondary | ICD-10-CM | POA: Insufficient documentation

## 2015-04-11 DIAGNOSIS — C8590 Non-Hodgkin lymphoma, unspecified, unspecified site: Secondary | ICD-10-CM | POA: Diagnosis not present

## 2015-04-11 LAB — BASIC METABOLIC PANEL
ANION GAP: 11 (ref 5–15)
BUN: 18 mg/dL (ref 6–20)
CALCIUM: 9.6 mg/dL (ref 8.9–10.3)
CO2: 30 mmol/L (ref 22–32)
CREATININE: 1.29 mg/dL — AB (ref 0.44–1.00)
Chloride: 99 mmol/L — ABNORMAL LOW (ref 101–111)
GFR calc Af Amer: 49 mL/min — ABNORMAL LOW (ref >60)
GFR calc non Af Amer: 42 mL/min — ABNORMAL LOW (ref >60)
Glucose, Bld: 116 mg/dL — ABNORMAL HIGH (ref 65–99)
POTASSIUM: 3.9 mmol/L (ref 3.5–5.1)
Sodium: 140 mmol/L (ref 135–145)

## 2015-04-11 LAB — HEPATIC FUNCTION PANEL
ALT: 12 U/L — ABNORMAL LOW (ref 14–54)
AST: 21 U/L (ref 15–41)
Albumin: 4 g/dL (ref 3.5–5.0)
Alkaline Phosphatase: 75 U/L (ref 38–126)
BILIRUBIN INDIRECT: 0.7 mg/dL (ref 0.3–0.9)
BILIRUBIN TOTAL: 0.8 mg/dL (ref 0.3–1.2)
Bilirubin, Direct: 0.1 mg/dL (ref 0.1–0.5)
Total Protein: 6.2 g/dL — ABNORMAL LOW (ref 6.5–8.1)

## 2015-04-11 LAB — CBC
HCT: 32.3 % — ABNORMAL LOW (ref 36.0–46.0)
Hemoglobin: 10.7 g/dL — ABNORMAL LOW (ref 12.0–15.0)
MCH: 36.1 pg — AB (ref 26.0–34.0)
MCHC: 33.1 g/dL (ref 30.0–36.0)
MCV: 109.1 fL — ABNORMAL HIGH (ref 78.0–100.0)
Platelets: 49 10*3/uL — ABNORMAL LOW (ref 150–400)
RBC: 2.96 MIL/uL — ABNORMAL LOW (ref 3.87–5.11)
RDW: 15.7 % — AB (ref 11.5–15.5)
WBC: 2.7 10*3/uL — ABNORMAL LOW (ref 4.0–10.5)

## 2015-04-11 LAB — TSH: TSH: 19.852 u[IU]/mL — ABNORMAL HIGH (ref 0.350–4.500)

## 2015-04-11 LAB — DIGOXIN LEVEL: Digoxin Level: 0.8 ng/mL (ref 0.8–2.0)

## 2015-04-11 MED ORDER — LISINOPRIL 2.5 MG PO TABS: 1.2500 mg | ORAL_TABLET | Freq: Two times a day (BID) | ORAL | Status: DC

## 2015-04-11 MED ORDER — AMIODARONE HCL 200 MG PO TABS: 200.0000 mg | ORAL_TABLET | Freq: Every day | ORAL | Status: AC

## 2015-04-11 NOTE — Progress Notes (Signed)
Patient ID: Victoria Wood, female   DOB: 1951/04/03, 64 y.o.   MRN: [id number] PCP: Vicenta Aly  64 yo with long history of non-Hodgkin's lymphoma (since age 22) with multiple rounds of chemo and history of autologous bone marrow transplant was admitted in 6/16 with acute systolic CHF. She had been living in [place] and had her oncology care down there. She was up visiting her daughter. She has no history of cardiac problems. She was seen by a cardiologist in 12/15 prior to R-CHOP (doxorubicin-containing chemo). She had R-CHOP and then Rituxan/bendamustine more recently, which has been completed. She had a PET scan not long ago, and she was told that her cancer is non-active. Husband says that she has been told when she goes for infusions, etc, that her HR is high and her BP is low for months now. She had exertional dyspnea for several months prior to admission. She was admitted after her daughter noted her wheezing and she was taken to a primary MD and found to be hypoxic. Also of note, she has had signs of dementia that have been progressive for 2-3 years. She is on Aricept.  Echo in 6/16 showed EF 15-20%, diffuse hypokinesis, moderate MR, mild AI.  She went into atrial fibrillation with RVR while in the hospital.  She converted back to NSR with amiodarone.  We did not start long-term anticoagulation due to thrombocytopenia and fall risk.  Low blood pressure limited medication titration.  She is now home with her husband.  Currently, they are living in there house in [place].  No plan for now to return to [place].  Her husband provides most of the history.  She remains generally fatigued.  When walking, she uses a rollator or her husband's arm for stability.  No dyspnea walking slowly on flat ground.  No recent falls.  No lightheadedness.  No orthopnea/PND.    ECG: NSR, nonspecific T wave flattening  Labs (7/16): K 3.4, creatinine 1.13, hgb 8.8, plts 44K   PMH: 1. Atrial fibrillation:  Paroxysmal, she is on amiodarone.  No anticoagulation with fall risk/thrombocytopenia. 2. Non-Hodgkins lymphoma: Has been followed in [place].  She has had the diagnosis since age 50 with multiple rounds of chemo and history of autologous bone marrow transplant. She had R-CHOP and then Rituxan/bendamustine more recently, which has been completed. She had a PET scan not long ago, and she was told that her cancer is non-active. 3. Cardiomyopathy: Echo (6/16) with EF 15-20%, mildly dilated LV, moderate MR.  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 4.  Dementia 5. Thrombocytopenia: Chronic.  6. Type II diabetes.  7. GERD 8. Depression 9. Hyperlipidemia  SH: Married, lives with husband part of the year in [place] and part of the year in [place].  Has children.  Prior smoker.    FH: No premature CAD  ROS: All systems reviewed and negative except as per HPI.    Current Outpatient Prescriptions  Medication Sig Dispense Refill  . acetaminophen (TYLENOL) 500 MG tablet Take 500 mg by mouth every 6 (six) hours as needed for mild pain.    Marland Kitchen acyclovir (ZOVIRAX) 400 MG tablet Take 400 mg by mouth 2 (two) times daily.    Marland Kitchen albuterol (PROVENTIL HFA;VENTOLIN HFA) 108 (90 BASE) MCG/ACT inhaler Inhale 2 puffs into the lungs every 6 (six) hours as needed. Wheezing     . amiodarone (PACERONE) 200 MG tablet Take 1 tablet (200 mg total) by mouth daily. 30 tablet 3  . [employer] FIBER GUMMIES  PO Take 1 tablet by mouth daily.    . digoxin (LANOXIN) 0.125 MG tablet Take 0.0625 mg by mouth daily.    Marland Kitchen donepezil (ARICEPT) 5 MG tablet Take 5 mg by mouth daily.    . furosemide (LASIX) 40 MG tablet Take 1 tablet (40 mg total) by mouth daily. 30 tablet 3  . guaiFENesin (MUCINEX) 600 MG 12 hr tablet Take 1 tablet (600 mg total) by mouth 2 (two) times daily as needed for cough. 60 tablet 1  . insulin detemir (LEVEMIR) 100 UNIT/ML injection Inject 0.1 mLs (10 Units total) into the skin at bedtime. 10 mL 11  .  ivabradine (CORLANOR) 5 MG TABS tablet Take 1 tablet (5 mg total) by mouth 2 (two) times daily with a meal. 60 tablet 4  . lisinopril (ZESTRIL) 2.5 MG tablet Take 0.5 tablets (1.25 mg total) by mouth 2 (two) times daily. 30 tablet 3  . loratadine (CLARITIN) 10 MG tablet Take 10 mg by mouth daily.    . Magnesium Cl-Calcium Carbonate (SLOW-MAG PO) Take 1 tablet by mouth daily.     Marland Kitchen MELATONIN PO Take 1 tablet by mouth at bedtime.    . methylcellulose (ARTIFICIAL TEARS) 1 % ophthalmic solution Place 1 drop into both eyes daily as needed. dryness    . omeprazole (PRILOSEC) 40 MG capsule Take 40 mg by mouth daily.    . potassium chloride (K-DUR) 10 MEQ tablet Take 1 tablet (10 mEq total) by mouth daily. 30 tablet 3  . senna (SENOKOT) 8.6 MG TABS Take 2 tablets by mouth at bedtime. Constipation     . venlafaxine (EFFEXOR-XR) 75 MG 24 hr capsule Take 75 mg by mouth daily with breakfast.     . venlafaxine XR (EFFEXOR-XR) 75 MG 24 hr capsule Take 75 mg by mouth 2 (two) times daily.     No current facility-administered medications for this encounter.   BP 94/56 mmHg  Pulse 77  Wt 132 lb (59.875 kg)  SpO2 99% General: NAD Neck: No JVD, no thyromegaly or thyroid nodule.  Lungs: Clear to auscultation bilaterally with normal respiratory effort. CV: Nondisplaced PMI.  Heart regular S1/S2, no S3/S4, 2/6 HSM apex.  No peripheral edema.  No carotid bruit.  Normal pedal pulses.  Abdomen: Soft, nontender, no hepatosplenomegaly, no distention.  Skin: Intact without lesions or rashes.  Neurologic: Alert and oriented x 3.  Psych: Normal affect. Extremities: No clubbing or cyanosis.  HEENT: Normal.   Assessment/Plan: 1.  Chronic systolic CHF: EF 05-39% by 6/16 echo with moderate MR. 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 Symptoms have been progressive since early this year and HR was high/BP low for a while as well. She was admitted and diuresed in 6/16.  HR improved on Corlanor and BP  stable. She appears euvolemic.  - Continue digoxin and Corlanor at current doses.Check digoxin level today.   - Continue current Lasix 40 mg daily, check BMET today.  - Start low dose lisinopril, 1.25 mg bid with BMET in 2 wks.  - We have had a discussion about what to expect here. 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 She would not be a transplant candidate. With her dementia and NHL as well as questionable functional status, I do not think that she would be a good LVAD candidate.  2. Atrial fibrillation/flutter: New onset in 6/16 in hospital. She is a poor long-term anticoagulation candidate with significant thrombocytopenia and fairly high fall risk. She converted over to NSR  and remains in NSR today.  - She can decrease amiodarone to 200 mg daily.  Check LFTs and TSH today.  She will need regular eye exams.  3. NHL: Completed most recent chemo regimen. Per husband, had PET scan showing that disease is "non-active."  4. Dementia: x several years. She is on Aricept. She has trouble remembering what happened yesterday. 5. Thrombocytopenia: Chronic, mainly in the 40K range.  CBC today.   Followup in 1 month.   Loralie Champagne 04/11/2015

## 2015-04-11 NOTE — Patient Instructions (Signed)
DECREASE Amiodarone to 200, one tab daily START Lisinopril 2.5 mg, one half tab in the AM and one half tab in the PM  labs today and again in 2 weeks (BMET)  Your physician recommends that you schedule a follow-up appointment in: 1 month  Do the following things EVERYDAY: 1) Weigh yourself in the morning before breakfast. Write it down and keep it in a log. 2) Take your medicines as prescribed 3) Eat low salt foods-Limit salt (sodium) to 2000 mg per day.  4) Stay as active as you can everyday 5) Limit all fluids for the day to less than 2 liters 6)

## 2015-04-13 ENCOUNTER — Telehealth (HOSPITAL_COMMUNITY): Payer: Self-pay | Admitting: *Deleted

## 2015-04-13 NOTE — Telephone Encounter (Signed)
Husband called and said that [facility] TSH level was elevated. PCP was going to put her on Levothyroxine 50mg  1.5 tablets a day, then titrate. He just wanted to make sure that it is ok she takes this medication. It is to start tomorrow.

## 2015-04-13 NOTE — Telephone Encounter (Signed)
Spoke with dr Aundra Dubin and he said that it was fine to take the medication. Called husband back to let him know.

## 2015-04-26 ENCOUNTER — Ambulatory Visit (HOSPITAL_COMMUNITY)
Admission: RE | Admit: 2015-04-26 | Discharge: 2015-04-26 | Disposition: A | Discharge status: Home / Self Care | Payer: Medicare Other | Source: Ambulatory Visit | Attending: Internal Medicine | Admitting: Internal Medicine

## 2015-04-26 DIAGNOSIS — I5022 Chronic systolic (congestive) heart failure: Secondary | ICD-10-CM

## 2015-04-26 LAB — BASIC METABOLIC PANEL
Anion gap: 11 (ref 5–15)
BUN: 22 mg/dL — AB (ref 6–20)
CHLORIDE: 98 mmol/L — AB (ref 101–111)
CO2: 29 mmol/L (ref 22–32)
CREATININE: 1.37 mg/dL — AB (ref 0.44–1.00)
Calcium: 9.5 mg/dL (ref 8.9–10.3)
GFR calc Af Amer: 45 mL/min — ABNORMAL LOW (ref >60)
GFR calc non Af Amer: 39 mL/min — ABNORMAL LOW (ref >60)
GLUCOSE: 174 mg/dL — AB (ref 65–99)
POTASSIUM: 4 mmol/L (ref 3.5–5.1)
SODIUM: 138 mmol/L (ref 135–145)

## 2015-04-26 LAB — TSH: TSH: 21.018 u[IU]/mL — ABNORMAL HIGH (ref 0.350–4.500)

## 2015-04-26 LAB — T4, FREE: Free T4: 1 ng/dL (ref 0.61–1.12)

## 2015-04-27 LAB — T3, FREE: T3, Free: 1.6 pg/mL — ABNORMAL LOW (ref 2.0–4.4)

## 2015-05-08 ENCOUNTER — Telehealth (HOSPITAL_COMMUNITY): Payer: Self-pay | Admitting: *Deleted

## 2015-05-08 MED ORDER — DIGOXIN 125 MCG PO TABS: 0.0625 mg | ORAL_TABLET | Freq: Every day | ORAL | Status: AC

## 2015-05-08 NOTE — Telephone Encounter (Signed)
Pt's husband called stating [facility] told him insurance would not cover the digoxin, per D/C summary looks like pt was prescribe Digitek instead of generic digoxin, new rx sent to [facility], he will let me know if he has any more trouble

## 2015-05-12 ENCOUNTER — Encounter (HOSPITAL_COMMUNITY): Payer: Self-pay

## 2015-05-12 ENCOUNTER — Ambulatory Visit (HOSPITAL_COMMUNITY)
Admission: RE | Admit: 2015-05-12 | Discharge: 2015-05-12 | Disposition: A | Discharge status: Home / Self Care | Payer: Medicare Other | Source: Ambulatory Visit | Attending: Internal Medicine | Admitting: Internal Medicine

## 2015-05-12 VITALS — BP 102/58 | HR 92 | Wt 134.0 lb

## 2015-05-12 DIAGNOSIS — Z9221 Personal history of antineoplastic chemotherapy: Secondary | ICD-10-CM | POA: Diagnosis not present

## 2015-05-12 DIAGNOSIS — Z87891 Personal history of nicotine dependence: Secondary | ICD-10-CM | POA: Insufficient documentation

## 2015-05-12 DIAGNOSIS — K219 Gastro-esophageal reflux disease without esophagitis: Secondary | ICD-10-CM | POA: Insufficient documentation

## 2015-05-12 DIAGNOSIS — Z9481 Bone marrow transplant status: Secondary | ICD-10-CM | POA: Diagnosis not present

## 2015-05-12 DIAGNOSIS — E119 Type 2 diabetes mellitus without complications: Secondary | ICD-10-CM | POA: Diagnosis not present

## 2015-05-12 DIAGNOSIS — I480 Paroxysmal atrial fibrillation: Secondary | ICD-10-CM

## 2015-05-12 DIAGNOSIS — F329 Major depressive disorder, single episode, unspecified: Secondary | ICD-10-CM | POA: Insufficient documentation

## 2015-05-12 DIAGNOSIS — Z79899 Other long term (current) drug therapy: Secondary | ICD-10-CM | POA: Diagnosis not present

## 2015-05-12 DIAGNOSIS — I4892 Unspecified atrial flutter: Secondary | ICD-10-CM | POA: Diagnosis not present

## 2015-05-12 DIAGNOSIS — C8590 Non-Hodgkin lymphoma, unspecified, unspecified site: Secondary | ICD-10-CM | POA: Insufficient documentation

## 2015-05-12 DIAGNOSIS — D696 Thrombocytopenia, unspecified: Secondary | ICD-10-CM | POA: Diagnosis not present

## 2015-05-12 DIAGNOSIS — I5022 Chronic systolic (congestive) heart failure: Secondary | ICD-10-CM

## 2015-05-12 DIAGNOSIS — E785 Hyperlipidemia, unspecified: Secondary | ICD-10-CM | POA: Diagnosis not present

## 2015-05-12 DIAGNOSIS — E039 Hypothyroidism, unspecified: Secondary | ICD-10-CM | POA: Insufficient documentation

## 2015-05-12 DIAGNOSIS — Z794 Long term (current) use of insulin: Secondary | ICD-10-CM | POA: Insufficient documentation

## 2015-05-12 DIAGNOSIS — I429 Cardiomyopathy, unspecified: Secondary | ICD-10-CM | POA: Insufficient documentation

## 2015-05-12 DIAGNOSIS — F0390 Unspecified dementia without behavioral disturbance: Secondary | ICD-10-CM | POA: Diagnosis not present

## 2015-05-12 LAB — BASIC METABOLIC PANEL
ANION GAP: 10 (ref 5–15)
BUN: 30 mg/dL — ABNORMAL HIGH (ref 6–20)
CALCIUM: 9.5 mg/dL (ref 8.9–10.3)
CO2: 24 mmol/L (ref 22–32)
CREATININE: 1.46 mg/dL — AB (ref 0.44–1.00)
Chloride: 101 mmol/L (ref 101–111)
GFR calc non Af Amer: 36 mL/min — ABNORMAL LOW (ref >60)
GFR, EST AFRICAN AMERICAN: 42 mL/min — AB (ref >60)
Glucose, Bld: 157 mg/dL — ABNORMAL HIGH (ref 65–99)
Potassium: 4 mmol/L (ref 3.5–5.1)
Sodium: 135 mmol/L (ref 135–145)

## 2015-05-12 LAB — BRAIN NATRIURETIC PEPTIDE: B Natriuretic Peptide: 561.4 pg/mL — ABNORMAL HIGH (ref 0.0–100.0)

## 2015-05-12 LAB — T4, FREE: FREE T4: 0.96 ng/dL (ref 0.61–1.12)

## 2015-05-12 LAB — TSH: TSH: 15.726 u[IU]/mL — ABNORMAL HIGH (ref 0.350–4.500)

## 2015-05-12 MED ORDER — IVABRADINE HCL 5 MG PO TABS: 7.5000 mg | ORAL_TABLET | Freq: Two times a day (BID) | ORAL | Status: DC

## 2015-05-12 NOTE — Progress Notes (Signed)
ADVANCED HF CLINIC NOTE Patient ID: Victoria Wood, female   DOB: 08/15/1951, 64 y.o.   MRN: [id number] PCP: Vicenta Aly HF Cardiologist: Aundra Dubin  64 yo with long history of dementia, non-Hodgkin's lymphoma (since age 89) with multiple rounds of chemo and history of autologous bone marrow transplant was admitted in 6/16 with acute systolic CHF. She had R-CHOP and then Rituxan/bendamustine more recently, which has been completed. She had a PET scan not long ago, and she was told that her cancer is non-active. Echo in 6/16 showed EF 15-20%, diffuse hypokinesis, moderate MR, mild AI.  She went into atrial fibrillation with RVR while in the hospital.  She converted back to NSR with amiodarone.  We did not start long-term anticoagulation due to thrombocytopenia and fall risk.  Low blood pressure limited medication titration.  She is now home with her husband.  Currently, they are living in there house in [place].  At last visit low-dose lisinopril 1.25mg  bid added. She is here with her daughter. She has problems with her memory but family feels it is getting better. She says she is stumbling more but hasn't fell. Uses a walker when she goes out. + dyspnea on exertion particularly with steps. Denies orthopnea, PND,edema. No problems with meds. Orthostasis has resolved. They follow weights and BP daily. SBP range 86-105. Weight 128-131.  Recently TSH was very elevated, synthroid was added. Also started on allopurinol for elevated uric acid.    ECG: NSR, nonspecific T wave flattening  Labs (7/16): K 3.4, creatinine 1.13, hgb 8.8, plts 44K Labs (04/26/15): K 4.0 creatinine 1.37 dig 0.8  PMH: 1. Atrial fibrillation: Paroxysmal, she is on amiodarone.  No anticoagulation with fall risk/thrombocytopenia. 2. Non-Hodgkins lymphoma: Has been followed in [place].  She has had the diagnosis since age 2 with multiple rounds of chemo and history of autologous bone marrow transplant. She had R-CHOP and then  Rituxan/bendamustine more recently, which has been completed. She had a PET scan not long ago, and she was told that her cancer is non-active. 3. Cardiomyopathy: Echo (6/16) with EF 15-20%, mildly dilated LV, moderate MR.  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 4.  Dementia 5. Thrombocytopenia: Chronic.  6. Type II diabetes.  7. GERD 8. Depression 9. Hyperlipidemia  SH: Married, lives with husband part of the year in [place] and part of the year in [place].  Has children.  Prior smoker.    FH: No premature CAD  ROS: All systems reviewed and negative except as per HPI.    Current Outpatient Prescriptions  Medication Sig Dispense Refill  . acetaminophen (TYLENOL) 500 MG tablet Take 500 mg by mouth every 6 (six) hours as needed for mild pain.    Marland Kitchen acyclovir (ZOVIRAX) 400 MG tablet Take 400 mg by mouth 2 (two) times daily.    Marland Kitchen albuterol (PROVENTIL HFA;VENTOLIN HFA) 108 (90 BASE) MCG/ACT inhaler Inhale 2 puffs into the lungs every 6 (six) hours as needed. Wheezing     . allopurinol (ZYLOPRIM) 100 MG tablet Take 100 mg by mouth daily.  4  . amiodarone (PACERONE) 200 MG tablet Take 1 tablet (200 mg total) by mouth daily. 30 tablet 3  . [employer] FIBER GUMMIES PO Take 1 tablet by mouth daily.    . digoxin (LANOXIN) 0.125 MG tablet Take 0.5 tablets (0.0625 mg total) by mouth daily. 30 tablet 6  . donepezil (ARICEPT) 5 MG tablet Take 5 mg by mouth daily.    . furosemide (LASIX) 40 MG tablet Take 1  tablet (40 mg total) by mouth daily. 30 tablet 3  . ivabradine (CORLANOR) 5 MG TABS tablet Take 1 tablet (5 mg total) by mouth 2 (two) times daily with a meal. 60 tablet 4  . levothyroxine (SYNTHROID, LEVOTHROID) 50 MCG tablet Take 50 mcg by mouth daily.    Marland Kitchen lisinopril (ZESTRIL) 2.5 MG tablet Take 0.5 tablets (1.25 mg total) by mouth 2 (two) times daily. 30 tablet 3  . Magnesium Cl-Calcium Carbonate (SLOW-MAG PO) Take 1 tablet by mouth daily.     Marland Kitchen MELATONIN PO Take 1 tablet by mouth  at bedtime.    . methylcellulose (ARTIFICIAL TEARS) 1 % ophthalmic solution Place 1 drop into both eyes daily as needed. dryness    . polyethylene glycol (MIRALAX / GLYCOLAX) packet Take 17 g by mouth daily.    . potassium chloride (K-DUR) 10 MEQ tablet Take 1 tablet (10 mEq total) by mouth daily. 30 tablet 3  . senna (SENOKOT) 8.6 MG TABS Take 1 tablet by mouth at bedtime. Constipation     . venlafaxine XR (EFFEXOR-XR) 75 MG 24 hr capsule Take 75 mg by mouth 2 (two) times daily.    . insulin detemir (LEVEMIR) 100 UNIT/ML injection Inject 0.1 mLs (10 Units total) into the skin at bedtime. (Patient not taking: Reported on 05/12/2015) 10 mL 11   No current facility-administered medications for this encounter.   BP 102/58 mmHg  Pulse 92  Wt 134 lb (60.782 kg)  SpO2 99% General: NAD Neck: No JVD, +RIJ port-a-cath. no thyromegaly or thyroid nodule.  Lungs: Clear to auscultation bilaterally with normal respiratory effort. CV: Nondisplaced PMI.  Heart regular S1/S2, no S3/S4, 2/6 HSM apex.  No peripheral edema.  No carotid bruit.  Normal pedal pulses.  Abdomen: Soft, nontender, no hepatosplenomegaly, no distention.  Skin: Intact without lesions or rashes.  Neurologic: Alert and oriented x 3.  Psych: Normal affect. Extremities: No clubbing or cyanosis.  HEENT: Normal.   Assessment/Plan: 1.  Chronic systolic CHF: EF 31-54% by 6/16 echo with moderate MR. 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 HR improved on Corlanor and BP stable. Low BP limites med titration but is tolerating lisinopril. She appears euvolemic.  - Continue digoxin. Recent level 0.8 - Increase Corlanor to 7.5 bid. No b-blocker yet due to low BP.  - Continue current Lasix 40 mg daily, check BMET today. Take extra dose if weight 133 or greater. - Continue lisinopril, 1.25 mg bid - We have had a discussion about what to expect here. 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 She  would not be a transplant candidate. With her dementia and NHL as well as questionable functional status, I do not think that she would be a good LVAD or ICD candidate.  2. Atrial fibrillation/flutter: New onset in 6/16 in hospital. She is a poor long-term anticoagulation candidate with significant thrombocytopenia and fairly high fall risk. She converted over to NSR and remains in NSR today.  - She can decrease amiodarone to 200 mg daily.   She will need regular eye exams.  3. NHL: Completed most recent chemo regimen. Per husband, had PET scan showing that disease is "non-active."  4. Dementia: x several years. She is on Aricept.  5. Thrombocytopenia: Chronic, mainly in the 40K range.  CBC today.  6. Hypothyroidism: May be related to [facility].Recently started synthroid. Will f/u TFTs today.   Followup in 1 month with echo.   Glori Bickers MD 05/12/2015

## 2015-05-12 NOTE — Patient Instructions (Signed)
INCREASE Corlanor to 7.5 mg, one and one half tab twice per day   Labs today  Your physician recommends that you schedule a follow-up appointment in: 4 weeks with a echocardiogram  Your physician has requested that you have an echocardiogram. Echocardiography is a painless test that uses sound waves to create images of your heart. It provides your doctor with information about the size and shape of your heart and how well your heart's chambers and valves are working. This procedure takes approximately one hour. There are no restrictions for this procedure.

## 2015-05-16 ENCOUNTER — Telehealth: Payer: Self-pay

## 2015-05-16 ENCOUNTER — Encounter (HOSPITAL_COMMUNITY): Payer: Self-pay

## 2015-05-16 NOTE — Telephone Encounter (Signed)
  [place] faxed Rejected Claim Response for Corlanor and note on it says " Exceeds max daily dose of 2.0 tablets. [employer] [place] [employer]. [phone]. I am also faxing the form to you.    Medication Detail      Disp Refills Start End     ivabradine (CORLANOR) 5 MG TABS tablet 90 tablet 4 05/12/2015     Sig - Route: Take 1.5 tablets (7.5 mg total) by mouth 2 (two) times daily with a meal. - Oral    Notes to Pharmacy: Please cancel all previous orders for current medication. Change in dosage or pill size.    E-Prescribing Status: Receipt confirmed by pharmacy (05/12/2015 11:38 AM EDT)

## 2015-05-17 ENCOUNTER — Other Ambulatory Visit (HOSPITAL_COMMUNITY): Payer: Self-pay | Admitting: Pharmacist

## 2015-05-17 ENCOUNTER — Telehealth (HOSPITAL_COMMUNITY): Payer: Self-pay | Admitting: Pharmacist

## 2015-05-17 DIAGNOSIS — I5022 Chronic systolic (congestive) heart failure: Secondary | ICD-10-CM

## 2015-05-17 MED ORDER — IVABRADINE HCL 7.5 MG PO TABS: 7.5000 mg | ORAL_TABLET | Freq: Two times a day (BID) | ORAL | Status: AC

## 2015-05-17 NOTE — Telephone Encounter (Signed)
Sent in new E-Rx to [place] for increased Corlanor dose of 7.5 mg tablets BID.   Ruta Hinds. Velva Harman, PharmD, BCPS, CPP Clinical Pharmacist Pager: [phone] Phone: [phone] 05/17/2015 11:34 AM

## 2015-05-23 ENCOUNTER — Encounter: Payer: Self-pay | Admitting: Rehabilitation

## 2015-05-23 ENCOUNTER — Ambulatory Visit: Payer: Medicare Other | Attending: Family Medicine | Admitting: Rehabilitation

## 2015-05-23 DIAGNOSIS — R531 Weakness: Secondary | ICD-10-CM | POA: Insufficient documentation

## 2015-05-23 DIAGNOSIS — R269 Unspecified abnormalities of gait and mobility: Secondary | ICD-10-CM | POA: Diagnosis not present

## 2015-05-23 DIAGNOSIS — R2681 Unsteadiness on feet: Secondary | ICD-10-CM | POA: Diagnosis present

## 2015-05-23 NOTE — Therapy (Signed)
[facility] [address] [employer] [place], [place], [address] Phone: [phone]   Fax:  [phone]  Physical Therapy Evaluation  Patient Details  Name: Victoria Wood MRN: [id number] Date of Birth: 26-Aug-1951 Referring Provider:  Vicenta Aly, FNP  Encounter Date: 05/23/2015      PT End of Session - 05/23/15 1044    Visit Number 1   Number of Visits 17  16+eval   Date for PT Re-Evaluation 07/22/15   Authorization Type G code every 10th visit   PT Start Time 0940   PT Stop Time 1025   PT Time Calculation (min) 45 min   Activity Tolerance Patient tolerated treatment well   Behavior During Therapy [facility] for tasks assessed/performed      Past Medical History  Diagnosis Date  . Gastric polyps     History of  . GERD (gastroesophageal reflux disease)   . Asthma   . Anemia   . Hyperlipidemia   . DM type 2 (diabetes mellitus, type 2)   . 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 Hypomagnesemia 01/13/2012  . Non Hodgkin's lymphoma     "5 times since she was 27" (03/02/2015)  . Cancer     hx of Rituxan in 07/2011  . Complication of anesthesia     has woken up during surgery   . History of blood transfusion "several"    "related to her chemo"  . Dementia     past early stage/spouse (03/02/2015)  . Depression     Past Surgical History  Procedure Laterality Date  . Portacath placement  X 2  . Upper gastrointestinal endoscopy  02/2011    gastric polyps x 2  . Lymph node dissection      neck and thigh surgery  . Esophagogastroduodenoscopy  08/22/2011    Procedure: ESOPHAGOGASTRODUODENOSCOPY (EGD);  Surgeon: Inda Castle, MD;  Location: [facility];  Service: Endoscopy;  Laterality: N/A;  . Lymph node biopsy Left 10/02/11    axillary  . Reduction mammaplasty Bilateral   . Bone marrow transplant  ~ 2014    "@ [facility]"    There were no vitals filed for this visit.  Visit Diagnosis:  Abnormality  of gait - Plan: PT plan of care cert/re-cert  Weakness - Plan: PT plan of care cert/re-cert  Unsteadiness - Plan: PT plan of care cert/re-cert      Subjective Assessment - 05/23/15 0942    Subjective Pt and friend reports that pt is "very unstable."  Reports that she is "alot better now" but gets short of breath very easily but intermittently and also needs supervision for mobility such as stairs and shuffles during gait.  Friend would like for pt to be more independent.    Patient is accompained by: Family member  family friend   Pertinent History Non-Hodgkins Lymphoma, CHF, afib, GERD   Limitations Walking;House hold activities   Patient Stated Goals To become as independent as possible (per friend)   Currently in Pain? No/denies  tends to have pain in stomach            [facility] PT Assessment - 05/23/15 0940    Assessment   Medical Diagnosis weakness   Onset Date/Surgical Date 03/05/15   Precautions   Precautions Fall   Restrictions   Weight Bearing Restrictions No   Home Environment   Living Environment Private residence   Living Arrangements Spouse/significant other;Non-relatives/Friends;Other relatives   Available Help at Discharge Family;Available 24 hours/day;Friend(s)  Type of [place] Access Stairs to enter   Entrance Stairs-Number of Steps 2   Entrance Stairs-Rails None   Home Layout Multi-level  split level   Alternate Level Stairs-Number of Steps 3  3 sets (going into different rooms inside)   Alternate Level Stairs-Rails Left   Home Equipment Walker - 4 wheels;Wheelchair - manual;Shower seat  walk in shower at her house, tub at daughters   Prior Function   Level of Independence Needs assistance with homemaking  Friend reports pt somewhat unsteady, but did not need 24/7   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 [place] visiting friends, going to movies, going to [employer]   Overall Cognitive  Status History of cognitive impairments - at baseline  Friend and pt reports "good days and bad"   Sensation   Light Touch Appears Intact   Proprioception Appears Intact   Coordination   Gross Motor Movements are Fluid and Coordinated Yes   Strength   Overall Strength Deficits   Overall Strength Comments R hip flex 3+/5, L hip flex 4/5, R knee flex 4-/5, L knee flex 4/5, R knee ext 4+/5, L knee ext 4+/5, B hip add 4+/5, L hip abd in SL 3+/5, R hip abd 4+/5, B ankle DF 4/5, B ankle PF 4+/5   Transfers   Transfers Sit to Stand;Stand to Sit   Sit to Stand 7: Independent   Five time sit to stand comments  Performed 5TSS in 14.16 secs    Stand to Sit 7: Independent   Ambulation/Gait   Ambulation/Gait Yes   Ambulation/Gait Assistance 5: Supervision;4: Min assist   Ambulation Distance (Feet) 300 Feet   Assistive device None   Gait Pattern Step-through pattern;Decreased stride length;Decreased weight shift to right;Decreased weight shift to left;Shuffle;Trunk flexed;Poor foot clearance - left;Poor foot clearance - right  decreased arm swing   Gait velocity 2.68 ft/sec   Stairs Yes   Stairs Assistance 5: Supervision   Stair Management Technique Two rails;Alternating pattern;Forwards   Number of Stairs 4   Height of Stairs 6   Standardized Balance Assessment   Standardized Balance Assessment Dynamic Gait Index   Dynamic Gait Index   Level Surface Mild Impairment   Change in Gait Speed Mild Impairment   Gait with Horizontal Head Turns Moderate Impairment   Gait with Vertical Head Turns Mild Impairment   Gait and Pivot Turn Severe Impairment   Step Over Obstacle Moderate Impairment   Step Around Obstacles Mild Impairment   Steps Mild Impairment   Total Score 12   DGI comment: Scores less than 19 indicative of high risk of falls                           PT Education - 05/23/15 1030    Education provided Yes   Education Details Provided education on balance  results, findings of evaluation, fall prevention strategies at home and using rollator while in community and continuing to have S while in home.     Person(s) Educated Patient;Other (comment)  Family friend   Methods Explanation;Demonstration;Verbal cues   Comprehension Verbalized understanding;Returned demonstration;Verbal cues required;Need further instruction          PT Short Term Goals - 05/23/15 1053    PT SHORT TERM GOAL #1   Title Pt and family/friends will verbalize fall prevention strategies to indicate decreased fall risk at home.  (  Target Date: 06/20/15)   Baseline Initiated education on 05/23/15   Time 4   Period Weeks   PT SHORT TERM GOAL #2   Title Pt will improve DGI score to 15/24 to indicate decreased fall risk during functional mobility.  (Target Date: 06/20/15)   Baseline Score 12/24 on 05/23/15   Time 4   Period Weeks   PT SHORT TERM GOAL #3   Title Pt and family/friends will initiate HEP to improve functional strength and balance to decrease fall risk.  (Target Date: 06/20/15)   Time 4   Period Weeks   PT SHORT TERM GOAL #4   Title Pt will ambulate 200' without AD while negotiating around and over obstacles without LOB in order to safely traverse home environment at S level.  (Target Date: 06/20/15)   Time 4   Period Weeks   PT SHORT TERM GOAL #5   Title Pt perform 6MWT to assess functional endurance. (Target Date: 06/20/15)   Baseline Performed 5 on 05/23/15   Time 4   Period Weeks           PT Long Term Goals - 05/23/15 1106    PT LONG TERM GOAL #1   Title Pt will improve DGI score to 18/24 to indicate functional decrease in fall risk at home.  (Target date: 07/18/15)   Time 8   Period Weeks   PT LONG TERM GOAL #2   Title Pt will perform HEP with min cues from family for improved functional strength and balance to indicate decreased fall risk.  (Target date: 07/18/15)   Time 8   Period Weeks   PT LONG TERM GOAL #3   Title Pt will ambulate >300' on  outdoor surfaces including grass, ramp, curb without AD at S level to indicate safe negotiation in community.  (Target date: 07/18/15)   Time 8   Period Weeks   PT LONG TERM GOAL #4   Title Pt will increase 6MWT distance by 191' to indicate improvement in functional endurance.   (Target date: 07/18/15)   Time 8   Period Weeks   PT LONG TERM GOAL #5   Title Pt will report no falls in last 4 weeks to indicate improved functional balance and safety in home and community.  (Target date: 07/18/15)   Time 8   Period Weeks               Plan - 05/23/15 1045    Clinical Impression Statement Pt presents with history of Non-Hodgkins Lymphoma and CHF with recent hospitalization in June for CHF exacerbation leading to increased balance deficits and increase in falls.  Note 3 falls since hospitalization per family friend and pt report.  Upon performing eval, note pt scored 12/24 on DGI placing pt at significant risk for having more falls, gait speed of 2.68 ft/sec however requiring up to min A to correct LOB placing pt at limited community ambulator status, and a Five Time sit to stand test score of 14.16 secs indicative of increased fall risk (Community dwelling adults should be < or equal to 12 secs).   Due to current deficits and fall risk feel that pt is appropriate for OP Neuro PT to address those mentioned below.      Pt will benefit from skilled therapeutic intervention in order to improve on the following deficits Abnormal gait;Cardiopulmonary status limiting activity;Decreased activity tolerance;Decreased balance;Decreased cognition;Decreased coordination;Decreased endurance;Decreased knowledge of precautions;Decreased knowledge of use of DME;Decreased mobility;Decreased safety awareness;Decreased strength;Dizziness;Impaired perceived functional  ability   Rehab Potential Good   Clinical Impairments Affecting Rehab Potential History of dementia   PT Frequency 2x / week   PT Duration 8 weeks   PT  Treatment/Interventions ADLs/Self Care Home Management;DME Instruction;Gait training;Stair training;Functional mobility training;Therapeutic activities;Therapeutic exercise;Balance training;Neuromuscular re-education;Cognitive remediation;Patient/family education;Energy conservation;Vestibular   PT Next Visit Plan Please set up with simple balance and strengthening HEP, assess functional ankle strength (consider rocker board), gait with turns, stepping strategy   Consulted and Agree with Plan of Care Patient;Family member/caregiver   Family Member Consulted Family friend           G-Codes - 06-05-2015 1120    Functional Assessment Tool Used DGI 12/24   Functional Limitation Mobility: Walking and moving around   Mobility: Walking and Moving Around Current Status [phone]) At least 40 percent but less than 60 percent impaired, limited or restricted   Mobility: Walking and Moving Around Goal Status [phone]) At least 20 percent but less than 40 percent impaired, limited or restricted       Problem List Patient Active Problem List   Diagnosis Date Noted  . Paroxysmal atrial fibrillation 04/11/2015  . Chronic systolic CHF (congestive heart failure) 04/11/2015  . Persistent atrial fibrillation   . Palliative care encounter   . Acute systolic heart failure 48/54/6270  . Sinus tachycardia 03/03/2015  . Dementia without behavioral disturbance 03/03/2015  . SOB (shortness of breath) 03/01/2015  . Pancytopenia 05/21/2012  . Hypomagnesemia 01/13/2012  . 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 Insomnia 12/03/2011  . Hepatitis cholestatic 09/12/2011  . Epigastric pain 08/21/2011  . Non-Hodgkin's lymphoma   . GERD (gastroesophageal reflux disease)   . HTN (hypertension)   . DM type 2 (diabetes mellitus, type 2)     Cameron Sprang, PT, MPT [facility] [address] [place], [place], [address] Phone: [phone]   Fax:   [phone] 05-Jun-2015, 1:14 PM

## 2015-06-05 ENCOUNTER — Ambulatory Visit: Payer: Medicare Other | Admitting: Rehabilitative and Restorative Service Providers"

## 2015-06-05 ENCOUNTER — Ambulatory Visit: Payer: Medicare Other | Admitting: Rehabilitation

## 2015-06-05 VITALS — BP 71/42 | HR 83

## 2015-06-05 DIAGNOSIS — R269 Unspecified abnormalities of gait and mobility: Secondary | ICD-10-CM

## 2015-06-05 DIAGNOSIS — R531 Weakness: Secondary | ICD-10-CM

## 2015-06-05 NOTE — Therapy (Signed)
[facility] [address] [place], [place], [address] Phone: [phone]   Fax:  [phone]  Physical Therapy Treatment  Patient Details  Name: Victoria Wood MRN: [id number] Date of Birth: 10-19-1950 Referring Provider:  Vicenta Aly, FNP  Encounter Date: 06/05/2015      PT End of Session - 06/05/15 1117    Visit Number 2   Number of Visits 17  16+eval   Date for PT Re-Evaluation 07/22/15   Authorization Type G code every 10th visit   PT Start Time 1106   PT Stop Time 1145   PT Time Calculation (min) 39 min   Activity Tolerance Patient tolerated treatment well   Behavior During Therapy [facility] for tasks assessed/performed      Past Medical History  Diagnosis Date  . Gastric polyps     History of  . GERD (gastroesophageal reflux disease)   . Asthma   . Anemia   . Hyperlipidemia   . DM type 2 (diabetes mellitus, type 2)   . 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 Hypomagnesemia 01/13/2012  . Non Hodgkin's lymphoma     "5 times since she was 27" (03/02/2015)  . Cancer     hx of Rituxan in 07/2011  . Complication of anesthesia     has woken up during surgery   . History of blood transfusion "several"    "related to her chemo"  . Dementia     past early stage/spouse (03/02/2015)  . Depression     Past Surgical History  Procedure Laterality Date  . Portacath placement  X 2  . Upper gastrointestinal endoscopy  02/2011    gastric polyps x 2  . Lymph node dissection      neck and thigh surgery  . Esophagogastroduodenoscopy  08/22/2011    Procedure: ESOPHAGOGASTRODUODENOSCOPY (EGD);  Surgeon: Inda Castle, MD;  Location: [facility];  Service: Endoscopy;  Laterality: N/A;  . Lymph node biopsy Left 10/02/11    axillary  . Reduction mammaplasty Bilateral   . Bone marrow transplant  ~ 2014    "@ [facility]"    Filed Vitals:   06/05/15 1113  BP: 71/42  Pulse: 83  *See monitoring of  vitals throughout session.  Reviewed patient's previous medical records and she has h/o low blood pressure per notes.  Her husband when he arrived near end of session reports she does usually run low in blood pressure, however this was lower than usual.  PT recommended patient see MD and her husband reports that she has appointment tomorrow.  Visit Diagnosis:  Abnormality of gait  Weakness      Subjective Assessment - 06/05/15 1107    Subjective The patient reports that she has a headache today since getting her shower.  She feels increased weakness today.  She needs assistance to recover from loss of balance walking into clinic (L foot drag created tripping).  Her caregiver did not remain during session- dropped off and reported her husband would be present to pick her up.   Pertinent History Non-Hodgkins Lymphoma, CHF, afib, GERD   Patient Stated Goals To become as independent as possible (per friend)   Currently in Pain? Yes  headache, PT to monitor, no goals related to pain            [facility] Adult PT Treatment/Exercise - 06/05/15 1123    Ambulation/Gait   Ambulation/Gait Yes   Ambulation/Gait Assistance 4: Min guard   Ambulation/Gait Assistance Details Patient  with general weakness with gait and occasional foot drag leading to unsteadiness.  She fatigues quickly today with gait and needs occasional rest breaks.  BP after walking 115 feet with one rest=81/44.   Ambulation Distance (Feet) 115 Feet   Assistive device None   Gait Pattern Poor foot clearance - left;Poor foot clearance - right;Trunk flexed;Decreased stride length   Ambulation Surface Level;Indoor   Gait Comments The patient had to stop to rest due to fatigue midway through 115 ft lap.  This was not consistent with performance on initial evaluation.   Exercises   Exercises Lumbar;Knee/Hip;Ankle   Lumbar Exercises: Supine   Bridge 10 reps   Bridge Limitations cues to maintain knees apart   Straight Leg Raise 10 reps    Straight Leg Raises Limitations cues to slowly lower (more challenging on the left side)   Other Supine Lumbar Exercises *checked BP after a cup of water and supine exercises and 102/52 with headache resolved.  Able to return to standing exercises      SELF CARE/HOME MANAGEMENT: PT and patient's husband discussed patient not getting up quickly today and calling MD due to low blood pressure.  Her spouse feels comfortable monitoring the patient and they have a home BP cuff.         PT Education - 06/05/15 1347    Education provided Yes   Education Details Discussed patient's low blood pressure with her husband.  He plans to monitor patient today and call MD if BP remains low.   Person(s) Educated Patient;Spouse   Methods Explanation   Comprehension Verbalized understanding          PT Short Term Goals - 05/23/15 1053    PT SHORT TERM GOAL #1   Title Pt and family/friends will verbalize fall prevention strategies to indicate decreased fall risk at home.  (Target Date: 06/20/15)   Baseline Initiated education on 05/23/15   Time 4   Period Weeks   PT SHORT TERM GOAL #2   Title Pt will improve DGI score to 15/24 to indicate decreased fall risk during functional mobility.  (Target Date: 06/20/15)   Baseline Score 12/24 on 05/23/15   Time 4   Period Weeks   PT SHORT TERM GOAL #3   Title Pt and family/friends will initiate HEP to improve functional strength and balance to decrease fall risk.  (Target Date: 06/20/15)   Time 4   Period Weeks   PT SHORT TERM GOAL #4   Title Pt will ambulate 200' without AD while negotiating around and over obstacles without LOB in order to safely traverse home environment at S level.  (Target Date: 06/20/15)   Time 4   Period Weeks   PT SHORT TERM GOAL #5   Title Pt perform 6MWT to assess functional endurance. (Target Date: 06/20/15)   Baseline Performed 5 on 05/23/15   Time 4   Period Weeks           PT Long Term Goals - 05/23/15 1106    PT  LONG TERM GOAL #1   Title Pt will improve DGI score to 18/24 to indicate functional decrease in fall risk at home.  (Target date: 07/18/15)   Time 8   Period Weeks   PT LONG TERM GOAL #2   Title Pt will perform HEP with min cues from family for improved functional strength and balance to indicate decreased fall risk.  (Target date: 07/18/15)   Time 8   Period Weeks   PT  LONG TERM GOAL #3   Title Pt will ambulate >300' on outdoor surfaces including grass, ramp, curb without AD at S level to indicate safe negotiation in community.  (Target date: 07/18/15)   Time 8   Period Weeks   PT LONG TERM GOAL #4   Title Pt will increase 6MWT distance by 191' to indicate improvement in functional endurance.   (Target date: 07/18/15)   Time 8   Period Weeks   PT LONG TERM GOAL #5   Title Pt will report no falls in last 4 weeks to indicate improved functional balance and safety in home and community.  (Target date: 07/18/15)   Time 8   Period Weeks               Plan - 06/05/15 1348    Clinical Impression Statement Today's session limited by low blood pressure.  The patient arrived reporting feeling "weak" and c/o "headache".  PT monitored BP, which was low.  It improved after having a cup of water and then lying supine.  She was not able to ambulate as far today as at initial evaluation visit and required frequent rest.  PT recommended we hold further therapy today and discussed monitoring the patient today to ensure BP stable.     Clinical Impairments Affecting Rehab Potential History of dementia   Consulted and Agree with Plan of Care Patient;Family member/caregiver   Family Member Consulted spouse        Problem List Patient Active Problem List   Diagnosis Date Noted  . Paroxysmal atrial fibrillation 04/11/2015  . Chronic systolic CHF (congestive heart failure) 04/11/2015  . Persistent atrial fibrillation   . Palliative care encounter   . Acute systolic heart failure 84/13/2440  .  Sinus tachycardia 03/03/2015  . Dementia without behavioral disturbance 03/03/2015  . SOB (shortness of breath) 03/01/2015  . Pancytopenia 05/21/2012  . Hypomagnesemia 01/13/2012  . 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 Insomnia 12/03/2011  . Hepatitis cholestatic 09/12/2011  . Epigastric pain 08/21/2011  . Non-Hodgkin's lymphoma   . GERD (gastroesophageal reflux disease)   . HTN (hypertension)   . DM type 2 (diabetes mellitus, type 2)     WEAVER,CHRISTINA, PT 06/05/2015, 1:52 PM  [facility] [address] [place], [place], [address] Phone: [phone]   Fax:  [phone]

## 2015-06-07 ENCOUNTER — Ambulatory Visit: Payer: Medicare Other | Admitting: Rehabilitation

## 2015-06-07 ENCOUNTER — Encounter (HOSPITAL_COMMUNITY): Payer: Self-pay | Admitting: *Deleted

## 2015-06-07 ENCOUNTER — Emergency Department (HOSPITAL_COMMUNITY): Payer: Medicare Other

## 2015-06-07 ENCOUNTER — Emergency Department (HOSPITAL_COMMUNITY)
Admission: EM | Admit: 2015-06-07 | Discharge: 2015-06-07 | Disposition: A | Discharge status: Home / Self Care | Payer: Medicare Other | Attending: Emergency Medicine | Admitting: Emergency Medicine

## 2015-06-07 ENCOUNTER — Telehealth (HOSPITAL_COMMUNITY): Payer: Self-pay | Admitting: *Deleted

## 2015-06-07 VITALS — BP 74/44 | HR 81

## 2015-06-07 DIAGNOSIS — I952 Hypotension due to drugs: Secondary | ICD-10-CM | POA: Diagnosis not present

## 2015-06-07 DIAGNOSIS — Z9481 Bone marrow transplant status: Secondary | ICD-10-CM | POA: Insufficient documentation

## 2015-06-07 DIAGNOSIS — Z8572 Personal history of non-Hodgkin lymphomas: Secondary | ICD-10-CM | POA: Diagnosis not present

## 2015-06-07 DIAGNOSIS — Z79899 Other long term (current) drug therapy: Secondary | ICD-10-CM | POA: Insufficient documentation

## 2015-06-07 DIAGNOSIS — E119 Type 2 diabetes mellitus without complications: Secondary | ICD-10-CM | POA: Insufficient documentation

## 2015-06-07 DIAGNOSIS — J45909 Unspecified asthma, uncomplicated: Secondary | ICD-10-CM | POA: Diagnosis not present

## 2015-06-07 DIAGNOSIS — Z880 Allergy status to penicillin: Secondary | ICD-10-CM | POA: Diagnosis not present

## 2015-06-07 DIAGNOSIS — Z794 Long term (current) use of insulin: Secondary | ICD-10-CM | POA: Diagnosis not present

## 2015-06-07 DIAGNOSIS — T465X5A Adverse effect of other antihypertensive drugs, initial encounter: Secondary | ICD-10-CM | POA: Insufficient documentation

## 2015-06-07 DIAGNOSIS — N189 Chronic kidney disease, unspecified: Secondary | ICD-10-CM | POA: Diagnosis not present

## 2015-06-07 DIAGNOSIS — Z862 Personal history of diseases of the blood and blood-forming organs and certain disorders involving the immune mechanism: Secondary | ICD-10-CM | POA: Diagnosis not present

## 2015-06-07 DIAGNOSIS — F329 Major depressive disorder, single episode, unspecified: Secondary | ICD-10-CM | POA: Diagnosis not present

## 2015-06-07 DIAGNOSIS — I4891 Unspecified atrial fibrillation: Secondary | ICD-10-CM | POA: Insufficient documentation

## 2015-06-07 DIAGNOSIS — E039 Hypothyroidism, unspecified: Secondary | ICD-10-CM | POA: Diagnosis not present

## 2015-06-07 DIAGNOSIS — F0390 Unspecified dementia without behavioral disturbance: Secondary | ICD-10-CM | POA: Diagnosis not present

## 2015-06-07 DIAGNOSIS — I959 Hypotension, unspecified: Secondary | ICD-10-CM | POA: Diagnosis present

## 2015-06-07 DIAGNOSIS — K219 Gastro-esophageal reflux disease without esophagitis: Secondary | ICD-10-CM | POA: Diagnosis not present

## 2015-06-07 DIAGNOSIS — Z87440 Personal history of urinary (tract) infections: Secondary | ICD-10-CM | POA: Diagnosis not present

## 2015-06-07 DIAGNOSIS — R2681 Unsteadiness on feet: Secondary | ICD-10-CM

## 2015-06-07 LAB — T4, FREE: Free T4: 1.08 ng/dL (ref 0.61–1.12)

## 2015-06-07 LAB — CBC WITH DIFFERENTIAL/PLATELET
Basophils Absolute: 0 10*3/uL (ref 0.0–0.1)
Basophils Relative: 1 %
Eosinophils Absolute: 0.1 10*3/uL (ref 0.0–0.7)
Eosinophils Relative: 2 %
HEMATOCRIT: 26.5 % — AB (ref 36.0–46.0)
HEMOGLOBIN: 8.8 g/dL — AB (ref 12.0–15.0)
LYMPHS ABS: 1.6 10*3/uL (ref 0.7–4.0)
LYMPHS PCT: 38 %
MCH: 35.5 pg — AB (ref 26.0–34.0)
MCHC: 33.2 g/dL (ref 30.0–36.0)
MCV: 106.9 fL — ABNORMAL HIGH (ref 78.0–100.0)
MONOS PCT: 10 %
Monocytes Absolute: 0.4 10*3/uL (ref 0.1–1.0)
NEUTROS ABS: 2.1 10*3/uL (ref 1.7–7.7)
NEUTROS PCT: 50 %
Platelets: 56 10*3/uL — ABNORMAL LOW (ref 150–400)
RBC: 2.48 MIL/uL — ABNORMAL LOW (ref 3.87–5.11)
RDW: 14.3 % (ref 11.5–15.5)
WBC: 4.1 10*3/uL (ref 4.0–10.5)

## 2015-06-07 LAB — I-STAT CHEM 8, ED
BUN: 46 mg/dL — ABNORMAL HIGH (ref 6–20)
CREATININE: 2.4 mg/dL — AB (ref 0.44–1.00)
Calcium, Ion: 1.45 mmol/L — ABNORMAL HIGH (ref 1.13–1.30)
Chloride: 101 mmol/L (ref 101–111)
Glucose, Bld: 97 mg/dL (ref 65–99)
HEMATOCRIT: 25 % — AB (ref 36.0–46.0)
HEMOGLOBIN: 8.5 g/dL — AB (ref 12.0–15.0)
Potassium: 4.4 mmol/L (ref 3.5–5.1)
Sodium: 137 mmol/L (ref 135–145)
TCO2: 25 mmol/L (ref 0–100)

## 2015-06-07 LAB — DIGOXIN LEVEL: DIGOXIN LVL: 0.6 ng/mL — AB (ref 0.8–2.0)

## 2015-06-07 LAB — I-STAT TROPONIN, ED: Troponin i, poc: 0.02 ng/mL (ref 0.00–0.08)

## 2015-06-07 LAB — BASIC METABOLIC PANEL
ANION GAP: 9 (ref 5–15)
BUN: 51 mg/dL — ABNORMAL HIGH (ref 6–20)
CHLORIDE: 101 mmol/L (ref 101–111)
CO2: 25 mmol/L (ref 22–32)
Calcium: 11.2 mg/dL — ABNORMAL HIGH (ref 8.9–10.3)
Creatinine, Ser: 2.43 mg/dL — ABNORMAL HIGH (ref 0.44–1.00)
GFR calc non Af Amer: 20 mL/min — ABNORMAL LOW (ref >60)
GFR, EST AFRICAN AMERICAN: 23 mL/min — AB (ref >60)
Glucose, Bld: 161 mg/dL — ABNORMAL HIGH (ref 65–99)
Potassium: 4.7 mmol/L (ref 3.5–5.1)
Sodium: 135 mmol/L (ref 135–145)

## 2015-06-07 LAB — MAGNESIUM: Magnesium: 1.8 mg/dL (ref 1.7–2.4)

## 2015-06-07 LAB — BRAIN NATRIURETIC PEPTIDE: B NATRIURETIC PEPTIDE 5: 366.6 pg/mL — AB (ref 0.0–100.0)

## 2015-06-07 LAB — TSH: TSH: 15.464 u[IU]/mL — ABNORMAL HIGH (ref 0.350–4.500)

## 2015-06-07 MED ORDER — HEPARIN SOD (PORK) LOCK FLUSH 100 UNIT/ML IV SOLN
500.0000 [IU] | INTRAVENOUS | Status: AC | PRN
  Administered 2015-06-07: 500 [IU]

## 2015-06-07 NOTE — Discharge Instructions (Signed)
1. Do not take Lisinopril until follow up with Dr. Aundra Dubin on Friday. 2. Do not take Furosemide until follow up with Dr. Aundra Dubin on Friday. 3. Follow up with Dr. Aundra Dubin on Friday.

## 2015-06-07 NOTE — ED Notes (Signed)
Family reports pt having fatigue today and hypotension at rehab appts. Has appt tomorrow for cardiology. EKG done at triage.

## 2015-06-07 NOTE — Telephone Encounter (Signed)
Husband of Victoria Wood called to let us know that she was currently at PT and her BP was 74/44 and 74/36 She just wants to sleep having hard time staying awake.  Spoke with Jerilee Hoh and since she is that symptomatic that we suggested taking her to the ER for evaluation. Will let DM and DB know that she is going to ER.

## 2015-06-07 NOTE — Therapy (Signed)
[facility] [address] [place], [place], [address] Phone: [phone]   Fax:  [phone]  Physical Therapy Treatment  Patient Details  Name: Victoria Wood MRN: [id number] Date of Birth: 1951-04-01 Referring Provider:  Vicenta Aly, FNP  Encounter Date: 06/07/2015    Past Medical History  Diagnosis Date  . Gastric polyps     History of  . GERD (gastroesophageal reflux disease)   . Asthma   . Anemia   . Hyperlipidemia   . DM type 2 (diabetes mellitus, type 2)   . 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 Hypomagnesemia 01/13/2012  . Non Hodgkin's lymphoma     "5 times since she was 27" (03/02/2015)  . Cancer     hx of Rituxan in 07/2011  . Complication of anesthesia     has woken up during surgery   . History of blood transfusion "several"    "related to her chemo"  . Dementia     past early stage/spouse (03/02/2015)  . Depression     Past Surgical History  Procedure Laterality Date  . Portacath placement  X 2  . Upper gastrointestinal endoscopy  02/2011    gastric polyps x 2  . Lymph node dissection      neck and thigh surgery  . Esophagogastroduodenoscopy  08/22/2011    Procedure: ESOPHAGOGASTRODUODENOSCOPY (EGD);  Surgeon: Inda Castle, MD;  Location: [facility];  Service: Endoscopy;  Laterality: N/A;  . Lymph node biopsy Left 10/02/11    axillary  . Reduction mammaplasty Bilateral   . Bone marrow transplant  ~ 2014    "@ [facility]"    Filed Vitals:   06/07/15 0948  BP: 74/44  Pulse: 81    Visit Diagnosis:  Unsteadiness        PT note: Note that upon pt arrival pt very fatigued, unbalanced and husband stating pt "not feeling well."  Assisted pt to treatment room in order to assess BP.  Note that BP on R arm was 74/44 and HR 81.  L arm BP was 74/36 with HR 76.  Husband present during session and called cardiologist (whom they had appointment to see tomorrow) and RN  states to go to ED.  Provided education why therapist unable to work with pt and also offered ambulance transportation/911 call if needed.  Husband declined and states he would take her to ED himself.  Assisted pt to car while husband pulled car around.  Spoke with husband about putting therapies on hold until get clearance from cardiologist on parameters and when to continue therapy.  Husband verbalized understanding.                            PT Short Term Goals - 05/23/15 1053    PT SHORT TERM GOAL #1   Title Pt and family/friends will verbalize fall prevention strategies to indicate decreased fall risk at home.  (Target Date: 06/20/15)   Baseline Initiated education on 05/23/15   Time 4   Period Weeks   PT SHORT TERM GOAL #2   Title Pt will improve DGI score to 15/24 to indicate decreased fall risk during functional mobility.  (Target Date: 06/20/15)   Baseline Score 12/24 on 05/23/15   Time 4   Period Weeks   PT SHORT TERM GOAL #3   Title Pt and family/friends will initiate HEP to improve functional strength and balance to decrease fall risk.  (  Target Date: 06/20/15)   Time 4   Period Weeks   PT SHORT TERM GOAL #4   Title Pt will ambulate 200' without AD while negotiating around and over obstacles without LOB in order to safely traverse home environment at S level.  (Target Date: 06/20/15)   Time 4   Period Weeks   PT SHORT TERM GOAL #5   Title Pt perform 6MWT to assess functional endurance. (Target Date: 06/20/15)   Baseline Performed 5 on 05/23/15   Time 4   Period Weeks           PT Long Term Goals - 05/23/15 1106    PT LONG TERM GOAL #1   Title Pt will improve DGI score to 18/24 to indicate functional decrease in fall risk at home.  (Target date: 07/18/15)   Time 8   Period Weeks   PT LONG TERM GOAL #2   Title Pt will perform HEP with min cues from family for improved functional strength and balance to indicate decreased fall risk.  (Target date:  07/18/15)   Time 8   Period Weeks   PT LONG TERM GOAL #3   Title Pt will ambulate >300' on outdoor surfaces including grass, ramp, curb without AD at S level to indicate safe negotiation in community.  (Target date: 07/18/15)   Time 8   Period Weeks   PT LONG TERM GOAL #4   Title Pt will increase 6MWT distance by 191' to indicate improvement in functional endurance.   (Target date: 07/18/15)   Time 8   Period Weeks   PT LONG TERM GOAL #5   Title Pt will report no falls in last 4 weeks to indicate improved functional balance and safety in home and community.  (Target date: 07/18/15)   Time 8   Period Weeks               Problem List Patient Active Problem List   Diagnosis Date Noted  . Paroxysmal atrial fibrillation 04/11/2015  . Chronic systolic CHF (congestive heart failure) 04/11/2015  . Persistent atrial fibrillation   . Palliative care encounter   . Acute systolic heart failure 89/37/3428  . Sinus tachycardia 03/03/2015  . Dementia without behavioral disturbance 03/03/2015  . SOB (shortness of breath) 03/01/2015  . Pancytopenia 05/21/2012  . Hypomagnesemia 01/13/2012  . 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 Insomnia 12/03/2011  . Hepatitis cholestatic 09/12/2011  . Epigastric pain 08/21/2011  . Non-Hodgkin's lymphoma   . GERD (gastroesophageal reflux disease)   . HTN (hypertension)   . DM type 2 (diabetes mellitus, type 2)     Cameron Sprang, PT, MPT [facility] [address] [place], [place], [address] Phone: [phone]   Fax:  [phone] 06/07/2015, 12:19 PM

## 2015-06-07 NOTE — ED Provider Notes (Signed)
CSN: [id number]     Arrival date & time 06/07/15  1018 History   First MD Initiated Contact with Patient 06/07/15 1109     Chief Complaint  Patient presents with  . Fatigue  . Hypotension     (Consider location/radiation/quality/duration/timing/severity/associated sxs/prior Treatment) HPI Ms. Rothenberger is a 64 yo female with PMH of HFrEF (EF 15-20% June), non-Hodgkins lymphoma, afib, hypothyroid, and DMII, presenting with fatigue and hypotension.  Patient has had increased fatigue and weakness since she stopped going to PT 4 weeks ago.  When she went to restart PT this week, her BP was noted to be 74/44.  Family states that her BP is normal in the morning when he checks it every day (105/75); however, she has been repeatedly low at PT.  She endorses orthostasis when standing, but she often spends the day sitting, lying, or sleeping.    She is followed by [facility].  She was recently started on Corlanor, in addition to her current regimen of Digoxin, Amiodarone, Lisinopril, and Lasix.    She denies F/C, CP, SOB, orthopnea, or leg swelling.  She was recently diagnosed with a UTI and given a short course of Bactrim.  She denies urgency, frequency, or dysuria.    Past Medical History  Diagnosis Date  . Gastric polyps     History of  . GERD (gastroesophageal reflux disease)   . Asthma   . Anemia   . Hyperlipidemia   . DM type 2 (diabetes mellitus, type 2)   . 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 Hypomagnesemia 01/13/2012  . Non Hodgkin's lymphoma     "5 times since she was 27" (03/02/2015)  . Cancer     hx of Rituxan in 07/2011  . Complication of anesthesia     has woken up during surgery   . History of blood transfusion "several"    "related to her chemo"  . Dementia     past early stage/spouse (03/02/2015)  . Depression    Past Surgical History  Procedure Laterality Date  . Portacath placement  X 2  . Upper gastrointestinal endoscopy  02/2011    gastric  polyps x 2  . Lymph node dissection      neck and thigh surgery  . Esophagogastroduodenoscopy  08/22/2011    Procedure: ESOPHAGOGASTRODUODENOSCOPY (EGD);  Surgeon: Inda Castle, MD;  Location: [facility];  Service: Endoscopy;  Laterality: N/A;  . Lymph node biopsy Left 10/02/11    axillary  . Reduction mammaplasty Bilateral   . Bone marrow transplant  ~ 2014    "@ [facility]"   Family History  Problem Relation Age of Onset  . Hypertension Mother   . Cancer Mother     breast  . Hypertension Father   . Kidney disease Father   . Cancer Father     throat  . Cancer Maternal Grandmother     breast   Social History  Substance Use Topics  . Smoking status: Never Smoker   . Smokeless tobacco: Never Used  . Alcohol Use: 0.0 oz/week     Comment: 03/02/2015 "used to have have a couple drinks/month; none since 06/2014"   OB History    No data available     Review of Systems  Constitutional: Positive for appetite change and fatigue. Negative for fever, chills, diaphoresis and unexpected weight change.  HENT: Negative for congestion and rhinorrhea.   Respiratory: Positive for cough. Negative for chest tightness, shortness of breath and wheezing.  Cardiovascular: Negative for chest pain, palpitations and leg swelling.  Gastrointestinal: Negative for nausea, vomiting, abdominal pain, diarrhea and constipation.  Endocrine: Negative for polyuria.  Genitourinary: Negative for dysuria, urgency and frequency.  Skin: Negative for rash.  Neurological: Positive for dizziness and light-headedness. Negative for syncope and headaches.      Allergies  Penicillins and Shellfish allergy  Home Medications   Prior to Admission medications   Medication Sig Start Date End Date Taking? Authorizing Provider  acetaminophen (TYLENOL) 500 MG tablet Take 500 mg by mouth every 6 (six) hours as needed for mild pain.   Yes Historical Provider, MD  acyclovir (ZOVIRAX) 400 MG tablet Take 400 mg by  mouth 2 (two) times daily.   Yes Historical Provider, MD  allopurinol (ZYLOPRIM) 100 MG tablet Take 100 mg by mouth daily. 02/02/15  Yes Historical Provider, MD  amiodarone (PACERONE) 200 MG tablet Take 1 tablet (200 mg total) by mouth daily. 04/11/15  Yes Larey Dresser, MD  [employer] FIBER GUMMIES PO Take 1 tablet by mouth daily.   Yes Historical Provider, MD  digoxin (LANOXIN) 0.125 MG tablet Take 0.5 tablets (0.0625 mg total) by mouth daily. 05/08/15  Yes Larey Dresser, MD  ivabradine (CORLANOR) 7.5 MG TABS tablet Take 1 tablet (7.5 mg total) by mouth 2 (two) times daily with a meal. 05/17/15  Yes Jolaine Artist, MD  levothyroxine (SYNTHROID, LEVOTHROID) 88 MCG tablet Take 88 mcg by mouth daily before breakfast.   Yes Historical Provider, MD  lisinopril (ZESTRIL) 2.5 MG tablet Take 0.5 tablets (1.25 mg total) by mouth 2 (two) times daily. 04/11/15  Yes Larey Dresser, MD  Magnesium Cl-Calcium Carbonate (SLOW-MAG PO) Take 1 tablet by mouth daily.    Yes Historical Provider, MD  MELATONIN PO Take 1 tablet by mouth at bedtime.   Yes Historical Provider, MD  potassium chloride (K-DUR) 10 MEQ tablet Take 1 tablet (10 mEq total) by mouth daily. 03/11/15  Yes Ripudeep Krystal Eaton, MD  venlafaxine XR (EFFEXOR-XR) 75 MG 24 hr capsule Take 75 mg by mouth 2 (two) times daily.   Yes Historical Provider, MD  albuterol (PROVENTIL HFA;VENTOLIN HFA) 108 (90 BASE) MCG/ACT inhaler Inhale 2 puffs into the lungs every 6 (six) hours as needed. Wheezing     Historical Provider, MD  furosemide (LASIX) 40 MG tablet Take 1 tablet (40 mg total) by mouth daily. Patient not taking: Reported on 06/07/2015 03/11/15   Ripudeep Krystal Eaton, MD  insulin detemir (LEVEMIR) 100 UNIT/ML injection Inject 0.1 mLs (10 Units total) into the skin at bedtime. Patient not taking: Reported on 05/12/2015 03/11/15   Ripudeep K Rai, MD   BP 95/34 mmHg  Pulse 108  Temp(Src) 97.8 F (36.6 C) (Oral)  Resp 20  Ht 5\' 6"  (1.676 m)  Wt 124 lb 3.2 oz (56.337 kg)  BMI  20.06 kg/m2  SpO2 70% Physical Exam  Constitutional: She is oriented to person, place, and time. She appears well-developed and well-nourished.  Cachectic female, lying in bed, NAD  HENT:  Head: Normocephalic and atraumatic.  Eyes: EOM are normal.  Neck: Normal range of motion. No JVD present.  Cardiovascular: Normal rate, regular rhythm, normal heart sounds and intact distal pulses.   Pulmonary/Chest: Effort normal. No respiratory distress. She has no wheezes.  Minimal crackles in b/l bases.  Abdominal: Soft. She exhibits no distension. There is no tenderness. There is no rebound and no guarding.  Musculoskeletal: Normal range of motion. She exhibits no edema.  Neurological:  She is alert and oriented to person, place, and time.  Skin: Skin is warm and dry. No rash noted.    ED Course  Procedures (including critical care time) Labs Review Labs Reviewed  BASIC METABOLIC PANEL - Abnormal; Notable for the following:    Glucose, Bld 161 (*)    BUN 51 (*)    Creatinine, Ser 2.43 (*)    Calcium 11.2 (*)    GFR calc non Af Amer 20 (*)    GFR calc Af Amer 23 (*)    All other components within normal limits  CBC WITH DIFFERENTIAL/PLATELET - Abnormal; Notable for the following:    RBC 2.48 (*)    Hemoglobin 8.8 (*)    HCT 26.5 (*)    MCV 106.9 (*)    MCH 35.5 (*)    Platelets 56 (*)    All other components within normal limits  BRAIN NATRIURETIC PEPTIDE - Abnormal; Notable for the following:    B Natriuretic Peptide 366.6 (*)    All other components within normal limits  TSH - Abnormal; Notable for the following:    TSH 15.464 (*)    All other components within normal limits  DIGOXIN LEVEL - Abnormal; Notable for the following:    Digoxin Level 0.6 (*)    All other components within normal limits  I-STAT CHEM 8, ED - Abnormal; Notable for the following:    BUN 46 (*)    Creatinine, Ser 2.40 (*)    Calcium, Ion 1.45 (*)    Hemoglobin 8.5 (*)    HCT 25.0 (*)    All other  components within normal limits  MAGNESIUM  T4, FREE  I-STAT TROPOININ, ED    Imaging Review Dg Chest 2 View  06/07/2015   CLINICAL DATA:  Weakness and dizziness.  History of CHF.  EXAM: CHEST  2 VIEW  COMPARISON:  03/04/2015  FINDINGS: Right jugular Port-A-Cath is unchanged with tip overlying the mid upper SVC. Cardiomediastinal silhouette is within normal limits. The patient has taken a greater inspiration than on the prior study. The lungs are now clear. No pleural effusion or pneumothorax is seen. Left axillary surgical clips are noted. No acute osseous abnormality is identified.  IMPRESSION: Improved aeration of the lungs. No evidence of active cardiopulmonary disease.   Electronically Signed   By: Logan Bores M.D.   On: 06/07/2015 12:10   I have personally reviewed and evaluated these images and lab results as part of my medical decision-making.   EKG Interpretation First degree AV block.      MDM   Final diagnoses:  Hypotension due to drugs   Hypotension: Patient with HFrEF (EF 15-20%), followed by [facility].  Patient HF medications currently Corlanor, Digoxin, Amiodarone, Lisinopril, and Lasix.  She has follow-up appointment with HF clinic on Friday. Spoke with her Cardiologist Dr. Aundra Dubin, who said to hold her Lisinopril until seen and Lasix today. Patient's baseline BP at home is high 80s to low 90s per family, and per last clinic note. BP 100/45 prior to discharge.  Patient tolerated food and drink and felt safe for discharge.  Patient will hold Lisinopril and Lasix until follow-up with Cardiology on Friday.  Acute on Chronic CKD: Likely 2/2 dehydration and over diuresis.  Creatinine improved slightly with 1L NS.  Will hold Lisinopril and Lasix, as per Cardiologist, until follow-up.     Iline Oven, MD 06/07/15 [place], MD 06/07/15 [phone]

## 2015-06-07 NOTE — ED Notes (Signed)
Contacted lab to add-on T4

## 2015-06-09 ENCOUNTER — Encounter (HOSPITAL_COMMUNITY): Payer: Self-pay

## 2015-06-09 ENCOUNTER — Ambulatory Visit (HOSPITAL_BASED_OUTPATIENT_CLINIC_OR_DEPARTMENT_OTHER)
Admission: RE | Admit: 2015-06-09 | Discharge: 2015-06-09 | Disposition: A | Discharge status: Home / Self Care | Payer: Medicare Other | Source: Ambulatory Visit | Attending: Cardiology | Admitting: Cardiology

## 2015-06-09 ENCOUNTER — Ambulatory Visit (HOSPITAL_COMMUNITY)
Admission: RE | Admit: 2015-06-09 | Discharge: 2015-06-09 | Disposition: A | Discharge status: Home / Self Care | Payer: Medicare Other | Source: Ambulatory Visit | Attending: Cardiology | Admitting: Cardiology

## 2015-06-09 VITALS — BP 96/58 | HR 81 | Wt 127.8 lb

## 2015-06-09 DIAGNOSIS — I429 Cardiomyopathy, unspecified: Secondary | ICD-10-CM | POA: Diagnosis not present

## 2015-06-09 DIAGNOSIS — Z79899 Other long term (current) drug therapy: Secondary | ICD-10-CM | POA: Diagnosis not present

## 2015-06-09 DIAGNOSIS — K219 Gastro-esophageal reflux disease without esophagitis: Secondary | ICD-10-CM | POA: Insufficient documentation

## 2015-06-09 DIAGNOSIS — E039 Hypothyroidism, unspecified: Secondary | ICD-10-CM | POA: Insufficient documentation

## 2015-06-09 DIAGNOSIS — I4892 Unspecified atrial flutter: Secondary | ICD-10-CM | POA: Insufficient documentation

## 2015-06-09 DIAGNOSIS — N189 Chronic kidney disease, unspecified: Secondary | ICD-10-CM | POA: Insufficient documentation

## 2015-06-09 DIAGNOSIS — E785 Hyperlipidemia, unspecified: Secondary | ICD-10-CM | POA: Insufficient documentation

## 2015-06-09 DIAGNOSIS — D696 Thrombocytopenia, unspecified: Secondary | ICD-10-CM | POA: Insufficient documentation

## 2015-06-09 DIAGNOSIS — I480 Paroxysmal atrial fibrillation: Secondary | ICD-10-CM | POA: Diagnosis not present

## 2015-06-09 DIAGNOSIS — F329 Major depressive disorder, single episode, unspecified: Secondary | ICD-10-CM | POA: Insufficient documentation

## 2015-06-09 DIAGNOSIS — Z87891 Personal history of nicotine dependence: Secondary | ICD-10-CM | POA: Insufficient documentation

## 2015-06-09 DIAGNOSIS — F0390 Unspecified dementia without behavioral disturbance: Secondary | ICD-10-CM | POA: Insufficient documentation

## 2015-06-09 DIAGNOSIS — I5022 Chronic systolic (congestive) heart failure: Secondary | ICD-10-CM | POA: Diagnosis present

## 2015-06-09 DIAGNOSIS — E1122 Type 2 diabetes mellitus with diabetic chronic kidney disease: Secondary | ICD-10-CM | POA: Diagnosis not present

## 2015-06-09 DIAGNOSIS — C8590 Non-Hodgkin lymphoma, unspecified, unspecified site: Secondary | ICD-10-CM | POA: Insufficient documentation

## 2015-06-09 LAB — BASIC METABOLIC PANEL
ANION GAP: 9 (ref 5–15)
BUN: 36 mg/dL — ABNORMAL HIGH (ref 6–20)
CO2: 28 mmol/L (ref 22–32)
Calcium: 12.3 mg/dL — ABNORMAL HIGH (ref 8.9–10.3)
Chloride: 103 mmol/L (ref 101–111)
Creatinine, Ser: 1.88 mg/dL — ABNORMAL HIGH (ref 0.44–1.00)
GFR calc non Af Amer: 27 mL/min — ABNORMAL LOW (ref >60)
GFR, EST AFRICAN AMERICAN: 31 mL/min — AB (ref >60)
GLUCOSE: 136 mg/dL — AB (ref 65–99)
POTASSIUM: 5.1 mmol/L (ref 3.5–5.1)
Sodium: 140 mmol/L (ref 135–145)

## 2015-06-09 LAB — DIGOXIN LEVEL: Digoxin Level: 0.8 ng/mL (ref 0.8–2.0)

## 2015-06-09 MED ORDER — FUROSEMIDE 40 MG PO TABS: 40.0000 mg | ORAL_TABLET | ORAL | Status: DC | PRN

## 2015-06-09 MED ORDER — POTASSIUM CHLORIDE ER 10 MEQ PO TBCR: 10.0000 meq | EXTENDED_RELEASE_TABLET | ORAL | Status: DC | PRN

## 2015-06-09 NOTE — Progress Notes (Signed)
Echocardiogram 2D Echocardiogram has been performed.  Victoria Wood 06/09/2015, 10:49 AM

## 2015-06-09 NOTE — Progress Notes (Signed)
ADVANCED HF CLINIC NOTE  Patient ID: Victoria Wood, female   DOB: 04-13-1951, 64 y.o.   MRN: [id number] PCP: Vicenta Aly HF Cardiologist: Aundra Dubin  64 yo with long history of dementia, non-Hodgkin's lymphoma (since age 62) with multiple rounds of chemo and history of autologous bone marrow transplant was admitted in 6/16 with acute systolic CHF. She had R-CHOP and then Rituxan/bendamustine more recently, which has been completed. She had a PET scan not long ago, and she was told that her cancer is non-active. Echo in 6/16 showed EF 15-20%, diffuse hypokinesis, moderate MR, mild AI.  She went into atrial fibrillation with RVR while in the hospital.  She converted back to NSR with amiodarone.  We did not start long-term anticoagulation due to thrombocytopenia and fall risk.  Low blood pressure limited medication titration.  She returns today for regular HF follow up and Echo. She was in the ER several days ago for hypotension and AKI and has held lisinopril and lasix since then and was treated with IVF. Has felt much better. Lives in [place] with husband or stays with daughter when he is out of town. Appetite and activity level continue to decrease. Has problems with short term memory, but stable. No falls. Uses a walker or rollator when going out. Occasional dyspnea on exertion. Avoids steps. Denies orthopnea, PND,edema. No problems with meds. Orthostasis has resolved. They follow weights and BP daily. SBP range 90-110 off lisinopril and Lasix. Weight at home 127-129.   ECG: NSR with 1st degree block 75 bpm  Labs (7/16): K 3.4, creatinine 1.13, hgb 8.8, plts 44K Labs (8/16): K 4.0 creatinine 1.37 dig 0.8, LFTs normal Labs (9/16): K 4.4, creatinine 2.40, digoxin 0.6, BNP 367, TSH 15, hgb 8.8  PMH: 1. Atrial fibrillation: Paroxysmal, she is on amiodarone.  No anticoagulation with fall risk/thrombocytopenia. 2. Non-Hodgkins lymphoma: Has been followed in [place].  She has had the diagnosis since  age 27 with multiple rounds of chemo and history of autologous bone marrow transplant. She had R-CHOP and then Rituxan/bendamustine more recently, which has been completed. She had a PET scan not long ago, and she was told that her cancer is non-active. 3. Cardiomyopathy: Echo (6/16) with EF 15-20%, mildly dilated LV, moderate MR.  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 Echo (9/16) with EF 35%, normal RV size and systolic function.  4.  Dementia 5. Thrombocytopenia: Chronic.  6. Type II diabetes.  7. GERD 8. Depression 9. Hyperlipidemia 10. CKD 11. Hypothyroidism  SH: Married, lives with husband part of the year in [place] and part of the year in [place].  Has children.  Prior smoker.    FH: No premature CAD  ROS: All systems reviewed and negative except as per HPI.    Current Outpatient Prescriptions  Medication Sig Dispense Refill  . acetaminophen (TYLENOL) 500 MG tablet Take 500 mg by mouth every 6 (six) hours as needed for mild pain.    Marland Kitchen acyclovir (ZOVIRAX) 400 MG tablet Take 400 mg by mouth 2 (two) times daily.    Marland Kitchen albuterol (PROVENTIL HFA;VENTOLIN HFA) 108 (90 BASE) MCG/ACT inhaler Inhale 2 puffs into the lungs every 6 (six) hours as needed. Wheezing     . allopurinol (ZYLOPRIM) 100 MG tablet Take 100 mg by mouth daily.  4  . amiodarone (PACERONE) 200 MG tablet Take 1 tablet (200 mg total) by mouth daily. 30 tablet 3  . [employer] FIBER GUMMIES PO Take 1 tablet by mouth daily.    . digoxin (  LANOXIN) 0.125 MG tablet Take 0.5 tablets (0.0625 mg total) by mouth daily. 30 tablet 6  . furosemide (LASIX) 40 MG tablet Take 1 tablet (40 mg total) by mouth daily. (Patient not taking: Reported on 06/07/2015) 30 tablet 3  . ivabradine (CORLANOR) 7.5 MG TABS tablet Take 1 tablet (7.5 mg total) by mouth 2 (two) times daily with a meal. 60 tablet 5  . levothyroxine (SYNTHROID, LEVOTHROID) 88 MCG tablet Take 88 mcg by mouth daily before breakfast.    . lisinopril (ZESTRIL) 2.5  MG tablet Take 0.5 tablets (1.25 mg total) by mouth 2 (two) times daily. 30 tablet 3  . Magnesium Cl-Calcium Carbonate (SLOW-MAG PO) Take 1 tablet by mouth daily.     Marland Kitchen MELATONIN PO Take 1 tablet by mouth at bedtime.    . potassium chloride (K-DUR) 10 MEQ tablet Take 1 tablet (10 mEq total) by mouth daily. 30 tablet 3  . venlafaxine XR (EFFEXOR-XR) 75 MG 24 hr capsule Take 75 mg by mouth 2 (two) times daily.     No current facility-administered medications for this encounter.   BP 96/58 mmHg  Pulse 81  Wt 127 lb 12 oz (57.947 kg)  SpO2 98%   General: NAD Neck: No JVD, +RIJ port-a-cath. no thyromegaly or thyroid nodule noted.  Lungs: CTA bilaterally with normal respiratory effort. CV: PMI nondisplaced.  Heart regular S1/S2, no S3/S4, 2/6 HSM apex.  No peripheral edema.  No carotid bruit.  Normal pedal pulses.  Abdomen: Soft, nontender, no HSM, no distention.  Skin: Intact without lesions or rashes.  Neurologic: Alert and oriented x 3.  Psych: Normal affect. Extremities: No clubbing or cyanosis.  HEENT: Normal.   Assessment/Plan: 1.  Chronic systolic CHF: EF 71-69% by echo today. Improved from 15-20% by 6/16 echo. 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 HR improved on Corlanor and BP stable. She appears euvolemic.  Low BP limits med titration.  Lisinopril and Lasix recently held for hypotension. - Continue digoxin. Check level today. - Continue Corlanor 7.5 bid. No b-blocker yet due to low BP.  - Use lasix prn for weights 130 or greater. - Hold lisinopril for now with low BP and creatinine up. BMET today. - She has a cardiomyopathy likely from doxorubicin cardiac toxicity. She would not be a transplant candidate. With her dementia and NHL as well as questionable functional status,  she would likely not be a good LVAD or ICD candidate.  2. Atrial fibrillation/flutter: New onset in 6/16 in hospital. She is a poor long-term anticoagulation candidate with significant  thrombocytopenia and fairly high fall risk. She converted over to NSR and remains in NSR today.  - Continue amiodarone to 200 mg daily.   She will need regular eye exams. LFTs normal in 8/16.  Has hypothyroidism followed by PCP.  3. NHL: Completed most recent chemo regimen. Per husband, had PET scan showing that disease is "non-active."  4. Dementia: x several years. She is on Aricept.  5. Thrombocytopenia: Chronic, mainly in the 40K range.  56k on 06/07/15 6. Hypothyroidism: May be related to [facility]. Started synthroid 04/2015.  Recent TSH elevated but down from previous. 7. CKD: Recheck creatinine today, recent risk in the setting of hypotension.  Labs today.  Follow up 2 weeks.  Satira Mccallum Tillery PA-C 06/09/2015   Patient seen with PA, agree with the above note.  Continue to hold lisinopril and will use Lasix prn weight > 130 lbs.  Will need to repeat BMET today with recent elevated creatinine.  Loralie Champagne 06/11/2015

## 2015-06-09 NOTE — Progress Notes (Signed)
Advanced Heart Failure Medication Review by a Pharmacist  Does the patient  feel that his/her medications are working for him/her?  yes  Has the patient been experiencing any side effects to the medications prescribed?  no  Does the patient measure his/her own blood pressure or blood glucose at home?  yes   Does the patient have any problems obtaining medications due to transportation or finances?   no  Understanding of regimen: good Understanding of indications: good Potential of compliance: good    Pharmacist comments:  Ms. Whetsel is a pleasant 64 yo F presenting with her son who had a current medication list on his phone. They report excellent compliance with all of her medications but state that she was told to hold her lasix and lisinopril on Wednesday after an ED visit. They did not have any other medication-related questions or concerns for me today.   Ruta Hinds. Velva Harman, PharmD, BCPS, CPP Clinical Pharmacist Pager: [phone] Phone: [phone] 06/09/2015 11:05 AM

## 2015-06-09 NOTE — Patient Instructions (Addendum)
CHANGE Lasix to 40 mg as needed for weight greater than 130 lbs CHANGE Potassium to 10 mg as needed only when you take a Lasix HOLD Lisinopril  Labs today  Your physician recommends that you schedule a follow-up appointment in: 2 weeks  Do the following things EVERYDAY: 1) Weigh yourself in the morning before breakfast. Write it down and keep it in a log. 2) Take your medicines as prescribed 3) Eat low salt foods-Limit salt (sodium) to 2000 mg per day.  4) Stay as active as you can everyday 5) Limit all fluids for the day to less than 2 liters 6)

## 2015-06-12 ENCOUNTER — Encounter: Payer: Self-pay | Admitting: Rehabilitation

## 2015-06-12 ENCOUNTER — Telehealth: Payer: Self-pay | Admitting: Rehabilitation

## 2015-06-12 ENCOUNTER — Telehealth (HOSPITAL_COMMUNITY): Payer: Self-pay | Admitting: Cardiology

## 2015-06-12 ENCOUNTER — Ambulatory Visit: Payer: Medicare Other | Attending: Family Medicine | Admitting: Rehabilitation

## 2015-06-12 VITALS — BP 85/48

## 2015-06-12 DIAGNOSIS — R2681 Unsteadiness on feet: Secondary | ICD-10-CM | POA: Diagnosis present

## 2015-06-12 DIAGNOSIS — R269 Unspecified abnormalities of gait and mobility: Secondary | ICD-10-CM

## 2015-06-12 DIAGNOSIS — R531 Weakness: Secondary | ICD-10-CM | POA: Diagnosis present

## 2015-06-12 NOTE — Patient Instructions (Signed)
   Holding onto counter at home with one hand, marching forward, turn and march back down length of counter.   Perform x 3 reps down and back, 2 times per day.  Make sure that your knees are as high as they can go and you are standing tall.  Have someone with you for safety.      Standing tall, face counter top and hold on with both hands for support.  Step to the side (large steps) down and back length of counter x 3 repetitions, 2 times per day.  Make sure that you stay facing the counter top and taking large steps out to the side.  Do not let your feet cross.  Note that you do not need to use band pictured above.

## 2015-06-12 NOTE — Telephone Encounter (Signed)
Note pt has followed up in office since ED visit.  Would like to know specific BP parameters when to hold therapy and when ok to resume therapy.  She did come to therapy today with BP 85/48 at lowest with mild c/o dizziness.  Better with rest up to 93/48.    Thanks,  Cameron Sprang, PT, MPT [facility] [address] [place], [place], [address] Phone: [phone]   Fax:  [phone] 06/12/2015, 11:03 AM

## 2015-06-12 NOTE — Telephone Encounter (Signed)
Incoming call regarding low b/p's  Raquel Sarna requested parameters for therapy  Per VO Dr.McLean SBP >80 ok for patient to participate in therapy

## 2015-06-12 NOTE — Therapy (Signed)
[facility] [address] [place], [place], [address] Phone: [phone]   Fax:  [phone]  Physical Therapy Treatment  Patient Details  Name: Victoria Wood MRN: [id number] Date of Birth: [birth year] Referring Provider:  Vicenta Aly, FNP  Encounter Date: 06/12/2015      PT End of Session - 06/12/15 1052    Visit Number 3   Number of Visits 17   Date for PT Re-Evaluation 07/22/15   Authorization Type G code every 10th visit   PT Start Time 0930   PT Stop Time 1015   PT Time Calculation (min) 45 min   Equipment Utilized During Treatment Gait belt   Activity Tolerance Patient tolerated treatment well   Behavior During Therapy [facility] for tasks assessed/performed      Past Medical History  Diagnosis Date  . Gastric polyps     History of  . GERD (gastroesophageal reflux disease)   . Asthma   . Anemia   . Hyperlipidemia   . DM type 2 (diabetes mellitus, type 2) ([place])   . 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 Hypomagnesemia 01/13/2012  . Non Hodgkin's lymphoma ([place])     "5 times since she was 27" (03/02/2015)  . Cancer ([place])     hx of Rituxan in 07/2011  . Complication of anesthesia     has woken up during surgery   . History of blood transfusion "several"    "related to her chemo"  . Dementia     past early stage/spouse (03/02/2015)  . Depression     Past Surgical History  Procedure Laterality Date  . Portacath placement  X 2  . Upper gastrointestinal endoscopy  02/2011    gastric polyps x 2  . Lymph node dissection      neck and thigh surgery  . Esophagogastroduodenoscopy  08/22/2011    Procedure: ESOPHAGOGASTRODUODENOSCOPY (EGD);  Surgeon: Inda Castle, MD;  Location: [facility];  Service: Endoscopy;  Laterality: N/A;  . Lymph node biopsy Left 10/02/11    axillary  . Reduction mammaplasty Bilateral   . Bone marrow transplant  ~ 2014    "@ [facility]"    Filed Vitals:    06/12/15 1050 06/12/15 1051  BP: 93/48 85/48    Visit Diagnosis:  Abnormality of gait  Unsteadiness  Weakness      Subjective Assessment - 06/12/15 0943    Subjective "I went to a fair yesterday with my grandkids"  Husband reports he pushed her around in w/c.     Patient is accompained by: Family member   Pertinent History Non-Hodgkins Lymphoma, CHF, afib, GERD   Limitations Walking;House hold activities   Patient Stated Goals To become as independent as possible (per friend)   Currently in Pain? No/denies                         [facility] Adult PT Treatment/Exercise - 06/12/15 0945    Ambulation/Gait   Ambulation/Gait Yes   Ambulation/Gait Assistance 4: Min guard;5: Supervision   Ambulation Distance (Feet) 115 Feet   Assistive device 4-wheeled walker   Gait Pattern Poor foot clearance - left;Poor foot clearance - right;Trunk flexed;Decreased stride length;Shuffle;Narrow base of support   Ambulation Surface Level;Unlevel;Indoor;Outdoor;Paved   Gait Comments Assessed gait with use of rollator for safety.  Ambulated approx 500' indoors around tight turns with cues for safety and use of brakes as needed for seated rest breaks.  Also ambulated  approx 450' outdoors on paved surfaces with rollator with cues for increased hip/knee flexion, closeness to rollator and safety with brakes when needing seated rest break.  Provided education to husband at end of session regarding use of rollator in home and that this may be difficult due to dementia, however still feel that she needs someone with her at all times during gait.  Also educated that she would need S with ambulation for short distances outside with rollator.  Pt and husband verbalized understanding.     Exercises   Exercises Other Exercises   Other Exercises  Performed high knee marching at counter top with single UE support to increase hip/knee strength to carryover to gait.  Also performed side stepping along counter  top with UE support to increase hip abd/add strength and again encourage large amplitude stepping to carry over to gait.  Attempted PWR! moves in standing for trunk rotation and large amplitude stepping to target (in 180' turns to chair behind her) x 10 reps each direction.  Pt with increased difficulty following commands and requires cues for increased step to ensure feet did not scissor during task.  Provided marching and side stepping for HEP, educated and gave handout to husband.      Lumbar Exercises: Supine   Bridge --   Bridge Limitations --   Straight Leg Raise --   Straight Leg Raises Limitations --   Other Supine Lumbar Exercises --                PT Education - 06/12/15 1051    Education provided Yes   Education Details Discussed safety at home and provided close S to min/guard when pt ambulating in home.  Also discussed need for rollator if ambulating short distances in community for safety, but also needs S with this.  Educated on HEP exercises and provided handout, see pt instruction.     Person(s) Educated Patient;Spouse   Methods Demonstration;Explanation;Handout;Verbal cues   Comprehension Verbalized understanding;Need further instruction;Returned demonstration          PT Short Term Goals - 05/23/15 1053    PT SHORT TERM GOAL #1   Title Pt and family/friends will verbalize fall prevention strategies to indicate decreased fall risk at home.  (Target Date: 06/20/15)   Baseline Initiated education on 05/23/15   Time 4   Period Weeks   PT SHORT TERM GOAL #2   Title Pt will improve DGI score to 15/24 to indicate decreased fall risk during functional mobility.  (Target Date: 06/20/15)   Baseline Score 12/24 on 05/23/15   Time 4   Period Weeks   PT SHORT TERM GOAL #3   Title Pt and family/friends will initiate HEP to improve functional strength and balance to decrease fall risk.  (Target Date: 06/20/15)   Time 4   Period Weeks   PT SHORT TERM GOAL #4   Title Pt  will ambulate 200' without AD while negotiating around and over obstacles without LOB in order to safely traverse home environment at S level.  (Target Date: 06/20/15)   Time 4   Period Weeks   PT SHORT TERM GOAL #5   Title Pt perform 6MWT to assess functional endurance. (Target Date: 06/20/15)   Baseline Performed 5 on 05/23/15   Time 4   Period Weeks           PT Long Term Goals - 05/23/15 1106    PT LONG TERM GOAL #1   Title Pt will improve DGI  score to 18/24 to indicate functional decrease in fall risk at home.  (Target date: 07/18/15)   Time 8   Period Weeks   PT LONG TERM GOAL #2   Title Pt will perform HEP with min cues from family for improved functional strength and balance to indicate decreased fall risk.  (Target date: 07/18/15)   Time 8   Period Weeks   PT LONG TERM GOAL #3   Title Pt will ambulate >300' on outdoor surfaces including grass, ramp, curb without AD at S level to indicate safe negotiation in community.  (Target date: 07/18/15)   Time 8   Period Weeks   PT LONG TERM GOAL #4   Title Pt will increase 6MWT distance by 191' to indicate improvement in functional endurance.   (Target date: 07/18/15)   Time 8   Period Weeks   PT LONG TERM GOAL #5   Title Pt will report no falls in last 4 weeks to indicate improved functional balance and safety in home and community.  (Target date: 07/18/15)   Time 8   Period Weeks               Plan - 06/12/15 1053    Clinical Impression Statement Skilled session focused on gait assessment and training with use of rollator on indoor and outdoor surfaces.  Feel that in the house due to cognitive deficits and safety concerns, pt may do better with HHA but do recommend that she have someone with her at all times when walking for safety.  Also recommend use of rollator outdoors for safety with S.  Provided and performed marching and side stepping exercises for LE strengthening to carry over to gait.  Tolerated all well with  multiple seated rest breaks needed due to fatigue.  Mild c/o dizziness, see vitals section for BP.  Spoke with husband regarding recent CHF clinic visit.  They are holding medications to ensure BP is not dropping.  This PT spoke with MD's RN for clarification, however no parameters given, RN to call back.     Pt will benefit from skilled therapeutic intervention in order to improve on the following deficits Abnormal gait;Cardiopulmonary status limiting activity;Decreased activity tolerance;Decreased balance;Decreased cognition;Decreased coordination;Decreased endurance;Decreased knowledge of precautions;Decreased knowledge of use of DME;Decreased mobility;Decreased safety awareness;Decreased strength;Dizziness;Impaired perceived functional ability   Rehab Potential Good   Clinical Impairments Affecting Rehab Potential History of dementia   PT Frequency 2x / week   PT Duration 8 weeks   PT Treatment/Interventions ADLs/Self Care Home Management;DME Instruction;Gait training;Stair training;Functional mobility training;Therapeutic activities;Therapeutic exercise;Balance training;Neuromuscular re-education;Cognitive remediation;Patient/family education;Energy conservation;Vestibular   PT Next Visit Plan Perform 6MWT.  Continue functional standing strengthening and balance, ankle/hip strategy, stepping strategy, gait with turns, obstacles.    Consulted and Agree with Plan of Care Patient;Family member/caregiver   Family Member Consulted spouse        Problem List Patient Active Problem List   Diagnosis Date Noted  . Paroxysmal atrial fibrillation ([place]) 04/11/2015  . Chronic systolic CHF (congestive heart failure) ([place]) 04/11/2015  . Persistent atrial fibrillation ([place])   . Palliative care encounter   . Acute systolic heart failure ([place]) 03/03/2015  . Sinus tachycardia ([place]) 03/03/2015  . Dementia without behavioral disturbance 03/03/2015  . SOB (shortness of breath) 03/01/2015  . Pancytopenia  05/21/2012  . Hypomagnesemia 01/13/2012  . 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 Insomnia 12/03/2011  . Hepatitis cholestatic 09/12/2011  . Epigastric pain 08/21/2011  . Non-Hodgkin's lymphoma ([place])   .  GERD (gastroesophageal reflux disease)   . HTN (hypertension)   . DM type 2 (diabetes mellitus, type 2) ([place])     Cameron Sprang, PT, MPT [facility] [address] [place], [place], [address] Phone: [phone]   Fax:  [phone] 06/12/2015, 11:01 AM

## 2015-06-14 ENCOUNTER — Encounter: Payer: Self-pay | Admitting: Rehabilitation

## 2015-06-14 ENCOUNTER — Ambulatory Visit: Payer: Medicare Other | Admitting: Rehabilitation

## 2015-06-14 DIAGNOSIS — R269 Unspecified abnormalities of gait and mobility: Secondary | ICD-10-CM | POA: Diagnosis not present

## 2015-06-14 DIAGNOSIS — R2681 Unsteadiness on feet: Secondary | ICD-10-CM

## 2015-06-14 DIAGNOSIS — R531 Weakness: Secondary | ICD-10-CM

## 2015-06-14 NOTE — Therapy (Signed)
[facility] [address] [place], [place], [address] Phone: [phone]   Fax:  [phone]  Physical Therapy Treatment  Patient Details  Name: Victoria Wood MRN: [id number] Date of Birth: [birth year] Referring Provider:  Vicenta Aly, FNP  Encounter Date: 06/14/2015      PT End of Session - 06/14/15 1230    Visit Number 4   Number of Visits 17   Date for PT Re-Evaluation 07/22/15   Authorization Type G code every 10th visit   PT Start Time 0930   PT Stop Time 1015   PT Time Calculation (min) 45 min   Equipment Utilized During Treatment Gait belt   Activity Tolerance Patient tolerated treatment well   Behavior During Therapy [facility] for tasks assessed/performed      Past Medical History  Diagnosis Date  . Gastric polyps     History of  . GERD (gastroesophageal reflux disease)   . Asthma   . Anemia   . Hyperlipidemia   . DM type 2 (diabetes mellitus, type 2) ([place])   . 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 Hypomagnesemia 01/13/2012  . Non Hodgkin's lymphoma ([place])     "5 times since she was 27" (03/02/2015)  . Cancer ([place])     hx of Rituxan in 07/2011  . Complication of anesthesia     has woken up during surgery   . History of blood transfusion "several"    "related to her chemo"  . Dementia     past early stage/spouse (03/02/2015)  . Depression     Past Surgical History  Procedure Laterality Date  . Portacath placement  X 2  . Upper gastrointestinal endoscopy  02/2011    gastric polyps x 2  . Lymph node dissection      neck and thigh surgery  . Esophagogastroduodenoscopy  08/22/2011    Procedure: ESOPHAGOGASTRODUODENOSCOPY (EGD);  Surgeon: Inda Castle, MD;  Location: [facility];  Service: Endoscopy;  Laterality: N/A;  . Lymph node biopsy Left 10/02/11    axillary  . Reduction mammaplasty Bilateral   . Bone marrow transplant  ~ 2014    "@ [facility]"    There were no  vitals filed for this visit.  Visit Diagnosis:  Unsteadiness  Abnormality of gait  Weakness      Subjective Assessment - 06/14/15 0934    Subjective Pt seems confused this morning stating "I was in [place] and my husband told me that I needed to get to work."    Patient is accompained by: Family member  caregiver, Conservation officer, historic buildings   Pertinent History Non-Hodgkins Lymphoma, CHF, afib, GERD   Limitations Walking;House hold activities   Patient Stated Goals To become as independent as possible (per friend)   Currently in Pain? No/denies                         [facility] Adult PT Treatment/Exercise - 06/14/15 0935    Transfers   Transfers Sit to Stand;Stand to Sit   Sit to Stand 7: Independent   Stand to Sit 7: Independent   Ambulation/Gait   Ambulation/Gait Yes   Ambulation/Gait Assistance 4: Min assist  to guide movement and gait speed   Ambulation Distance (Feet) 345 Feet   Assistive device None   Gait Comments Gait at distance above focused on large amplitude of movement to discourage shuffled gait pattern and B foot drag.  Pt unable to maintain large movements and  requires constant cues to attend to task.  Pt with very decreased sustained attention during session today.  Educated on performing mobility tasks with decreased distractions in order to get improved carryover and improve pts ability to follow directions.  Also performed gait to/from restroom x 2 reps during session approx 60' x 4 without AD at min A level with continued cues for increased hip/knee flexion and upright posture throughout.     Posture/Postural Control   Posture/Postural Control Postural limitations   Postural Limitations Forward head;Rounded Shoulders;Flexed trunk  Pt tends to keep forward flexed posture during gait   Posture Comments Also note that during gait when she shuffles feet, trunk tends to lead over feet.    Exercises   Exercises Other Exercises   Other Exercises  Initiated PWR! moves in  standing to encourage large steps, better posture and improved balance to carry over to gait.   Performed all 4 exercises 10 reps each for total of 20.  Educated caregiver Jazette on how to perform, how often, cues to give, and how to assist her safely.             PWR [facility]) - 06/14/15 1224    PWR! exercises Moves in standing   PWR! Up x 20 reps with repetitive cues for large arm movements and upright posture   PWR! Rock x10 reps each direction   PWR! Twist x 10 reps each direction with cues for continued arm movements and posture   PWR Step x 10 reps with cues for larger steps and "push off" with out set leg back to midline.     Comments Requires frequent re-direction esp in busier environment.               PT Education - 06/14/15 1227    Education provided Yes   Education Details Provided education on PWR! moves to perform at home with assist.  Also continue to provide recommendation for 24/7 S esp when pt is up moving for safety.  Caregiver states that when husband is in [place], pt stays with daughter, however does not assist her at night.  Provided education on safety concerns with pt ambulating at night by herself and recommend to have someone with her.     Person(s) Educated Patient;Caregiver(s)   Methods Explanation;Demonstration;Handout   Comprehension Verbalized understanding;Returned demonstration;Verbal cues required;Need further instruction          PT Short Term Goals - 05/23/15 1053    PT SHORT TERM GOAL #1   Title Pt and family/friends will verbalize fall prevention strategies to indicate decreased fall risk at home.  (Target Date: 06/20/15)   Baseline Initiated education on 05/23/15   Time 4   Period Weeks   PT SHORT TERM GOAL #2   Title Pt will improve DGI score to 15/24 to indicate decreased fall risk during functional mobility.  (Target Date: 06/20/15)   Baseline Score 12/24 on 05/23/15   Time 4   Period Weeks   PT SHORT TERM GOAL #3   Title Pt and  family/friends will initiate HEP to improve functional strength and balance to decrease fall risk.  (Target Date: 06/20/15)   Time 4   Period Weeks   PT SHORT TERM GOAL #4   Title Pt will ambulate 200' without AD while negotiating around and over obstacles without LOB in order to safely traverse home environment at S level.  (Target Date: 06/20/15)   Time 4   Period Weeks   PT SHORT TERM  GOAL #5   Title Pt perform 6MWT to assess functional endurance. (Target Date: 06/20/15)   Baseline Performed 5 on 05/23/15   Time 4   Period Weeks           PT Long Term Goals - 05/23/15 1106    PT LONG TERM GOAL #1   Title Pt will improve DGI score to 18/24 to indicate functional decrease in fall risk at home.  (Target date: 07/18/15)   Time 8   Period Weeks   PT LONG TERM GOAL #2   Title Pt will perform HEP with min cues from family for improved functional strength and balance to indicate decreased fall risk.  (Target date: 07/18/15)   Time 8   Period Weeks   PT LONG TERM GOAL #3   Title Pt will ambulate >300' on outdoor surfaces including grass, ramp, curb without AD at S level to indicate safe negotiation in community.  (Target date: 07/18/15)   Time 8   Period Weeks   PT LONG TERM GOAL #4   Title Pt will increase 6MWT distance by 191' to indicate improvement in functional endurance.   (Target date: 07/18/15)   Time 8   Period Weeks   PT LONG TERM GOAL #5   Title Pt will report no falls in last 4 weeks to indicate improved functional balance and safety in home and community.  (Target date: 07/18/15)   Time 8   Period Weeks               Plan - 06/14/15 1230    Clinical Impression Statement Skilled session focused on gait with large amplitude of movement as well as initiation of PWR! moves in standing for large amplitude movements to carryover to gait and posture.  Pt continues to be limited due to poor cognition and seems that therapy carryover may be difficult.  Will continue to  assess progress with STG's next session.    Pt will benefit from skilled therapeutic intervention in order to improve on the following deficits Abnormal gait;Cardiopulmonary status limiting activity;Decreased activity tolerance;Decreased balance;Decreased cognition;Decreased coordination;Decreased endurance;Decreased knowledge of precautions;Decreased knowledge of use of DME;Decreased mobility;Decreased safety awareness;Decreased strength;Dizziness;Impaired perceived functional ability   Rehab Potential Good   Clinical Impairments Affecting Rehab Potential History of dementia   PT Frequency 2x / week   PT Duration 8 weeks   PT Treatment/Interventions ADLs/Self Care Home Management;DME Instruction;Gait training;Stair training;Functional mobility training;Therapeutic activities;Therapeutic exercise;Balance training;Neuromuscular re-education;Cognitive remediation;Patient/family education;Energy conservation;Vestibular   PT Next Visit Plan Perform 6MWT (I would let pt use rollator for increased safety)!! Assess STG's    PT Home Exercise Plan Provided pt and caregiver with PWR! moves in standing   Consulted and Agree with Plan of Care Patient;Family member/caregiver        Problem List Patient Active Problem List   Diagnosis Date Noted  . Paroxysmal atrial fibrillation ([place]) 04/11/2015  . Chronic systolic CHF (congestive heart failure) ([place]) 04/11/2015  . Persistent atrial fibrillation ([place])   . Palliative care encounter   . Acute systolic heart failure ([place]) 03/03/2015  . Sinus tachycardia ([place]) 03/03/2015  . Dementia without behavioral disturbance 03/03/2015  . SOB (shortness of breath) 03/01/2015  . Pancytopenia 05/21/2012  . Hypomagnesemia 01/13/2012  . 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 Insomnia 12/03/2011  . Hepatitis cholestatic 09/12/2011  . Epigastric pain 08/21/2011  . Non-Hodgkin's lymphoma ([place])   . GERD (gastroesophageal reflux disease)   .  HTN (hypertension)   . DM type  2 (diabetes mellitus, type 2) ([place])     Cameron Sprang, PT, MPT [facility] [address] [place], [place], [address] Phone: [phone]   Fax:  [phone] 06/14/2015, 12:37 PM

## 2015-06-19 ENCOUNTER — Encounter: Payer: Self-pay | Admitting: Rehabilitation

## 2015-06-19 ENCOUNTER — Ambulatory Visit: Payer: Medicare Other | Admitting: Rehabilitation

## 2015-06-19 DIAGNOSIS — R531 Weakness: Secondary | ICD-10-CM

## 2015-06-19 DIAGNOSIS — R269 Unspecified abnormalities of gait and mobility: Secondary | ICD-10-CM | POA: Diagnosis not present

## 2015-06-19 DIAGNOSIS — R2681 Unsteadiness on feet: Secondary | ICD-10-CM

## 2015-06-19 NOTE — Therapy (Signed)
[facility] [address] [place], [place], [address] Phone: [phone]   Fax:  [phone]  Physical Therapy Treatment  Patient Details  Name: Victoria Wood MRN: [id number] Date of Birth: 06/25/1951 Referring Provider:  Vicenta Aly, FNP  Encounter Date: 06/19/2015      PT End of Session - 06/19/15 1228    Visit Number 5   Number of Visits 17   Date for PT Re-Evaluation 07/22/15   Authorization Type G code every 10th visit   PT Start Time 0933   PT Stop Time 1015   PT Time Calculation (min) 42 min   Equipment Utilized During Treatment Gait belt   Activity Tolerance Patient tolerated treatment well   Behavior During Therapy [facility] for tasks assessed/performed      Past Medical History  Diagnosis Date  . Gastric polyps     History of  . GERD (gastroesophageal reflux disease)   . Asthma   . Anemia   . Hyperlipidemia   . DM type 2 (diabetes mellitus, type 2) ([place])   . 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 Hypomagnesemia 01/13/2012  . Non Hodgkin's lymphoma ([place])     "5 times since she was 27" (03/02/2015)  . Cancer ([place])     hx of Rituxan in 07/2011  . Complication of anesthesia     has woken up during surgery   . History of blood transfusion "several"    "related to her chemo"  . Dementia     past early stage/spouse (03/02/2015)  . Depression     Past Surgical History  Procedure Laterality Date  . Portacath placement  X 2  . Upper gastrointestinal endoscopy  02/2011    gastric polyps x 2  . Lymph node dissection      neck and thigh surgery  . Esophagogastroduodenoscopy  08/22/2011    Procedure: ESOPHAGOGASTRODUODENOSCOPY (EGD);  Surgeon: Inda Castle, MD;  Location: [facility];  Service: Endoscopy;  Laterality: N/A;  . Lymph node biopsy Left 10/02/11    axillary  . Reduction mammaplasty Bilateral   . Bone marrow transplant  ~ 2014    "@ [facility]"    There were  no vitals filed for this visit.  Visit Diagnosis:  Unsteadiness  Abnormality of gait  Weakness      Subjective Assessment - 06/19/15 0937    Subjective Pts caregiver reports having UTI Friday and is currently taking antibiotic.     Patient is accompained by: Family member  caregiver   Pertinent History Non-Hodgkins Lymphoma, CHF, afib, GERD   Limitations Walking;House hold activities   Patient Stated Goals To become as independent as possible (per friend)   Currently in Pain? No/denies            [facility] PT Assessment - 06/19/15 0953    6 minute walk test results    Aerobic Endurance Distance OMBTDH 741   Endurance additional comments Needed to stop once and sit on rollator during testing.                      [facility] Adult PT Treatment/Exercise - 06/19/15 0953    Transfers   Transfers Sit to Stand;Stand to Sit   Sit to Stand 7: Independent   Stand to Sit 7: Independent   Ambulation/Gait   Ambulation/Gait Yes   Ambulation/Gait Assistance 5: Supervision  Close S   Ambulation Distance (Feet) 672 Feet  during 6MWT, another 345' x 1,  200' x 1   Assistive device None;4-wheeled walker  rollator during 6MWT for safety and endurance    Gait Pattern Poor foot clearance - left;Poor foot clearance - right;Trunk flexed;Decreased stride length;Shuffle;Narrow base of support   Ambulation Surface Level;Indoor   Posture/Postural Control   Posture/Postural Control Postural limitations   Postural Limitations Forward head;Rounded Shoulders;Flexed trunk  Pt tends to keep forward flexed posture during gait   Posture Comments Also note that during gait when she shuffles feet, trunk tends to lead over feet.    Standardized Balance Assessment   Standardized Balance Assessment Dynamic Gait Index   Dynamic Gait Index   Level Surface Mild Impairment   Change in Gait Speed Mild Impairment   Gait with Horizontal Head Turns Mild Impairment   Gait with Vertical Head Turns Moderate  Impairment   Gait and Pivot Turn Severe Impairment   Step Over Obstacle Moderate Impairment   Step Around Obstacles Mild Impairment   Steps Mild Impairment   Total Score 12   High Level Balance   High Level Balance Activities Negotitating around obstacles;Direction changes;Turns   High Level Balance Comments Performed all of the above in order to simulate home negotiation.  Requires close S for safety with no overt LOB, however cues for increased hip/knee and DF, and to relax arms as she tends to keep them wrapped near chest.  See STG.    Exercises   Exercises Other Exercises   Other Exercises  Assessed ankle and hip strategy while standing on ramp with feet shoulder width apart and feet staggered.  Note that when feet shoulder width apart, pt able to correct with cues for increased hip strategy, however when feet staggered, demonstrates overt LOB posteriorly and is unable to correct without stepping strategy.  However, despite stepping strategy, still requires up to mod A to recover balance.  Will continue to address.                   PT Education - 06/19/15 1011    Education provided Yes   Education Details Continue to provide max education on importance of 24/7 S needed at all times when pt up ambluating at home.  Pts caregiver states that daughter will likely not get up with her at night however they do use a baby monitor, but still do not get up with her.  Provided education based on current functional status and score of DGI that pt continues to be a very high fall risk.     Person(s) Educated Patient;Caregiver(s)   Methods Explanation   Comprehension Verbalized understanding          PT Short Term Goals - 06/19/15 0939    PT SHORT TERM GOAL #1   Title Pt and family/friends will verbalize fall prevention strategies to indicate decreased fall risk at home.  (Target Date: 06/20/15)   Baseline achieved on 06/19/15   Status Achieved   PT SHORT TERM GOAL #2   Title Pt will  improve DGI score to 15/24 to indicate decreased fall risk during functional mobility.  (Target Date: 06/20/15)   Baseline 12/24 on 06/19/15   Status Not Met   PT SHORT TERM GOAL #3   Title Pt and family/friends will initiate HEP to improve functional strength and balance to decrease fall risk.  (Target Date: 06/20/15)   Baseline met 06/19/15   Status Achieved   PT SHORT TERM GOAL #4   Title Pt will ambulate 200' without AD while negotiating around and over  obstacles without LOB in order to safely traverse home environment at S level.  (Target Date: 06/20/15)   Baseline Performed 250' without AD at close S level, continue to recommend close S for all gait at home.     Status Achieved   PT SHORT TERM GOAL #5   Title Pt perform 6MWT to assess functional endurance. (Target Date: 06/20/15)   Baseline 672' on 06/19/15   Status Achieved           PT Long Term Goals - 05/23/15 1106    PT LONG TERM GOAL #1   Title Pt will improve DGI score to 18/24 to indicate functional decrease in fall risk at home.  (Target date: 07/18/15)   Time 8   Period Weeks   PT LONG TERM GOAL #2   Title Pt will perform HEP with min cues from family for improved functional strength and balance to indicate decreased fall risk.  (Target date: 07/18/15)   Time 8   Period Weeks   PT LONG TERM GOAL #3   Title Pt will ambulate >300' on outdoor surfaces including grass, ramp, curb without AD at S level to indicate safe negotiation in community.  (Target date: 07/18/15)   Time 8   Period Weeks   PT LONG TERM GOAL #4   Title Pt will increase 6MWT distance by 191' to indicate improvement in functional endurance.   (Target date: 07/18/15)   Time 8   Period Weeks   PT LONG TERM GOAL #5   Title Pt will report no falls in last 4 weeks to indicate improved functional balance and safety in home and community.  (Target date: 07/18/15)   Time 8   Period Weeks               Plan - 06/19/15 1228    Clinical Impression  Statement Assessment of STG's today with pt meeting 4/5.  Pt has not made any gains in DGI and continues to be extremely high fall risk with score of 12/24.  Education on importance of continued close S with all mobility, however feel that this is not happening when daughter is caring for pt as she does not get up with her at night per caregiver report.  Continue to question measurable gains due to pts decreased cognition and carryover from session to session.  Caregiver states she performs exercises during the week when she is with her.    Pt will benefit from skilled therapeutic intervention in order to improve on the following deficits Abnormal gait;Cardiopulmonary status limiting activity;Decreased activity tolerance;Decreased balance;Decreased cognition;Decreased coordination;Decreased endurance;Decreased knowledge of precautions;Decreased knowledge of use of DME;Decreased mobility;Decreased safety awareness;Decreased strength;Dizziness;Impaired perceived functional ability   Rehab Potential Good   Clinical Impairments Affecting Rehab Potential History of dementia   PT Frequency 2x / week   PT Duration 8 weeks   PT Treatment/Interventions ADLs/Self Care Home Management;DME Instruction;Gait training;Stair training;Functional mobility training;Therapeutic activities;Therapeutic exercise;Balance training;Neuromuscular re-education;Cognitive remediation;Patient/family education;Energy conservation;Vestibular   PT Next Visit Plan balance, forward weight shifts, rocker board (small one), narrowing BOS   Consulted and Agree with Plan of Care Patient;Family member/caregiver        Problem List Patient Active Problem List   Diagnosis Date Noted  . Paroxysmal atrial fibrillation ([place]) 04/11/2015  . Chronic systolic CHF (congestive heart failure) ([place]) 04/11/2015  . Persistent atrial fibrillation ([place])   . Palliative care encounter   . Acute systolic heart failure ([place]) 03/03/2015  . Sinus  tachycardia ([place]) 03/03/2015  .  Dementia without behavioral disturbance 03/03/2015  . SOB (shortness of breath) 03/01/2015  . Pancytopenia 05/21/2012  . Hypomagnesemia 01/13/2012  . 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 Insomnia 12/03/2011  . Hepatitis cholestatic 09/12/2011  . Epigastric pain 08/21/2011  . Non-Hodgkin's lymphoma ([place])   . GERD (gastroesophageal reflux disease)   . HTN (hypertension)   . DM type 2 (diabetes mellitus, type 2) ([place])     Cameron Sprang, PT, MPT [facility] [address] [place], [place], [address] Phone: [phone]   Fax:  [phone] 06/19/2015, 12:33 PM

## 2015-06-21 ENCOUNTER — Ambulatory Visit: Payer: Medicare Other | Admitting: Rehabilitation

## 2015-06-21 ENCOUNTER — Encounter: Payer: Self-pay | Admitting: Rehabilitation

## 2015-06-21 DIAGNOSIS — R2681 Unsteadiness on feet: Secondary | ICD-10-CM

## 2015-06-21 DIAGNOSIS — R531 Weakness: Secondary | ICD-10-CM

## 2015-06-21 DIAGNOSIS — R269 Unspecified abnormalities of gait and mobility: Secondary | ICD-10-CM

## 2015-06-21 NOTE — Therapy (Signed)
[facility] [address] [place], [place], [address] Phone: [phone]   Fax:  [phone]  Physical Therapy Treatment  Patient Details  Name: Victoria Wood MRN: [id number] Date of Birth: 06/20/1951 Referring Provider:  Vicenta Aly, FNP  Encounter Date: 06/21/2015      PT End of Session - 06/21/15 1240    Visit Number 6   Number of Visits 17   Date for PT Re-Evaluation 07/22/15   Authorization Type G code every 10th visit   PT Start Time 0930   PT Stop Time 1015   PT Time Calculation (min) 45 min   Equipment Utilized During Treatment Gait belt   Activity Tolerance Patient tolerated treatment well   Behavior During Therapy [facility] for tasks assessed/performed      Past Medical History  Diagnosis Date  . Gastric polyps     History of  . GERD (gastroesophageal reflux disease)   . Asthma   . Anemia   . Hyperlipidemia   . DM type 2 (diabetes mellitus, type 2) ([place])   . 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 Hypomagnesemia 01/13/2012  . Non Hodgkin's lymphoma ([place])     "5 times since she was 27" (03/02/2015)  . Cancer ([place])     hx of Rituxan in 07/2011  . Complication of anesthesia     has woken up during surgery   . History of blood transfusion "several"    "related to her chemo"  . Dementia     past early stage/spouse (03/02/2015)  . Depression     Past Surgical History  Procedure Laterality Date  . Portacath placement  X 2  . Upper gastrointestinal endoscopy  02/2011    gastric polyps x 2  . Lymph node dissection      neck and thigh surgery  . Esophagogastroduodenoscopy  08/22/2011    Procedure: ESOPHAGOGASTRODUODENOSCOPY (EGD);  Surgeon: Inda Castle, MD;  Location: [facility];  Service: Endoscopy;  Laterality: N/A;  . Lymph node biopsy Left 10/02/11    axillary  . Reduction mammaplasty Bilateral   . Bone marrow transplant  ~ 2014    "@ [facility]"    There were  no vitals filed for this visit.  Visit Diagnosis:  Unsteadiness  Abnormality of gait  Weakness      Subjective Assessment - 06/21/15 0931    Subjective Pt and caregiver report not having good day yesterday.  Went to movies with friend, did not use rollator and friend had to hold onto her due to unsteadiness.  Continue to recommend that she use rollator in community for increased safety.    Patient is accompained by: Family member  caregiver   Pertinent History Non-Hodgkins Lymphoma, CHF, afib, GERD   Limitations Walking;House hold activities   Patient Stated Goals To become as independent as possible (per friend)   Currently in Pain? Yes   Pain Score 2    Pain Location Back   Pain Orientation Lower   Pain Descriptors / Indicators Aching   Pain Type Acute pain   Pain Onset Today             NMR and balance tasks:  Performed small rocker board activity in // bars to better assess ankle strategy- vertically and horizontally while maintaining stable board.  Pt with decreased ankle and hip strategy and tends to rely on hands to correct balance.  Seemed to improve with increased practice, therefore progressed to horizontal and vertical head  turns while on board in each direction.  Progressed to forwards and backwards weight shifts to work on controlled movements and increased ankle/hip strategy.  Again, requires increased facilitation for forward weight shift as well as L weight shift.  Progressed to larger rocker board in both directions with ant/post weight shifts and lateral weight shifts.  Same cues as above.    Wall bumps with feet closer to wall>progressing to further from wall x 5 reps each with 5 sec hold to increase hip strategy and hip extensor strength.  Progressed to wall bumps on foam balance beam with feet closer to wall x 10 reps with 5 sec hold to work on increased forward weight shift with upright posture.  Pt tends to over correct with trunk moving posteriorly and  therefore carrying weight posterior.    Standing on compliant surface: Feet apart with head turns up/down, side/side>feet together all x 30 secs.  Progressed to feet apart with eyes closed x 30 secs.  Note improvement in hip and ankle strategy throughout.    Education:  Discussed adding balance program to exercises and having pt alternate balance exercises one day with PWR moves the next day as not to over load pt.  Also provided education on sending note to MD regarding progress barriers in relation to poor cognition and hopeful establishment of long term plan as husband is still going to [place] every two weeks.                      PT Education - 06/21/15 0933    Education provided Yes   Education Details Continue to provide max education on using rollator in community for increased balance.  Also continue to recommend the need for 24/7 S.     Person(s) Educated Patient;Caregiver(s)   Methods Explanation   Comprehension Verbalized understanding          PT Short Term Goals - 06/19/15 0939    PT SHORT TERM GOAL #1   Title Pt and family/friends will verbalize fall prevention strategies to indicate decreased fall risk at home.  (Target Date: 06/20/15)   Baseline achieved on 06/19/15   Status Achieved   PT SHORT TERM GOAL #2   Title Pt will improve DGI score to 15/24 to indicate decreased fall risk during functional mobility.  (Target Date: 06/20/15)   Baseline 12/24 on 06/19/15   Status Not Met   PT SHORT TERM GOAL #3   Title Pt and family/friends will initiate HEP to improve functional strength and balance to decrease fall risk.  (Target Date: 06/20/15)   Baseline met 06/19/15   Status Achieved   PT SHORT TERM GOAL #4   Title Pt will ambulate 200' without AD while negotiating around and over obstacles without LOB in order to safely traverse home environment at S level.  (Target Date: 06/20/15)   Baseline Performed 250' without AD at close S level, continue to  recommend close S for all gait at home.     Status Achieved   PT SHORT TERM GOAL #5   Title Pt perform 6MWT to assess functional endurance. (Target Date: 06/20/15)   Baseline 672' on 06/19/15   Status Achieved           PT Long Term Goals - 05/23/15 1106    PT LONG TERM GOAL #1   Title Pt will improve DGI score to 18/24 to indicate functional decrease in fall risk at home.  (Target date: 07/18/15)   Time 8  Period Weeks   PT LONG TERM GOAL #2   Title Pt will perform HEP with min cues from family for improved functional strength and balance to indicate decreased fall risk.  (Target date: 07/18/15)   Time 8   Period Weeks   PT LONG TERM GOAL #3   Title Pt will ambulate >300' on outdoor surfaces including grass, ramp, curb without AD at S level to indicate safe negotiation in community.  (Target date: 07/18/15)   Time 8   Period Weeks   PT LONG TERM GOAL #4   Title Pt will increase 6MWT distance by 191' to indicate improvement in functional endurance.   (Target date: 07/18/15)   Time 8   Period Weeks   PT LONG TERM GOAL #5   Title Pt will report no falls in last 4 weeks to indicate improved functional balance and safety in home and community.  (Target date: 07/18/15)   Time 8   Period Weeks               Plan - 06/21/15 1241    Clinical Impression Statement Skilled session focused on dynamic balance with ankle and hip strategy.  Pt initially with over reliance of UEs, however as she progressed and with many cues, pt able to kick in ankle and hip strategy.     Pt will benefit from skilled therapeutic intervention in order to improve on the following deficits Abnormal gait;Cardiopulmonary status limiting activity;Decreased activity tolerance;Decreased balance;Decreased cognition;Decreased coordination;Decreased endurance;Decreased knowledge of precautions;Decreased knowledge of use of DME;Decreased mobility;Decreased safety awareness;Decreased strength;Dizziness;Impaired perceived  functional ability   Rehab Potential Good   Clinical Impairments Affecting Rehab Potential History of dementia   PT Frequency 2x / week   PT Duration 8 weeks   PT Treatment/Interventions ADLs/Self Care Home Management;DME Instruction;Gait training;Stair training;Functional mobility training;Therapeutic activities;Therapeutic exercise;Balance training;Neuromuscular re-education;Cognitive remediation;Patient/family education;Energy conservation;Vestibular   PT Next Visit Plan balance, forward weight shifts, rocker board (small one), narrowing BOS, add corner balance and wall bumps to HEP   Consulted and Agree with Plan of Care Patient;Family member/caregiver        Problem List Patient Active Problem List   Diagnosis Date Noted  . Paroxysmal atrial fibrillation ([place]) 04/11/2015  . Chronic systolic CHF (congestive heart failure) ([place]) 04/11/2015  . Persistent atrial fibrillation ([place])   . Palliative care encounter   . Acute systolic heart failure ([place]) 03/03/2015  . Sinus tachycardia ([place]) 03/03/2015  . Dementia without behavioral disturbance 03/03/2015  . SOB (shortness of breath) 03/01/2015  . Pancytopenia 05/21/2012  . Hypomagnesemia 01/13/2012  . 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 Insomnia 12/03/2011  . Hepatitis cholestatic 09/12/2011  . Epigastric pain 08/21/2011  . Non-Hodgkin's lymphoma ([place])   . GERD (gastroesophageal reflux disease)   . HTN (hypertension)   . DM type 2 (diabetes mellitus, type 2) ([place])     Cameron Sprang, PT, MPT [facility] [address] [place], [place], [address] Phone: [phone]   Fax:  [phone] 06/21/2015, 12:44 PM

## 2015-06-26 ENCOUNTER — Ambulatory Visit: Payer: Medicare Other | Admitting: Rehabilitation

## 2015-06-27 ENCOUNTER — Ambulatory Visit (HOSPITAL_COMMUNITY)
Admission: RE | Admit: 2015-06-27 | Discharge: 2015-06-27 | Disposition: A | Discharge status: Home / Self Care | Payer: Medicare Other | Source: Ambulatory Visit | Attending: Cardiology | Admitting: Cardiology

## 2015-06-27 VITALS — BP 96/48 | HR 80 | Wt 131.0 lb

## 2015-06-27 DIAGNOSIS — E785 Hyperlipidemia, unspecified: Secondary | ICD-10-CM | POA: Insufficient documentation

## 2015-06-27 DIAGNOSIS — I5022 Chronic systolic (congestive) heart failure: Secondary | ICD-10-CM

## 2015-06-27 DIAGNOSIS — I429 Cardiomyopathy, unspecified: Secondary | ICD-10-CM | POA: Insufficient documentation

## 2015-06-27 DIAGNOSIS — Z9481 Bone marrow transplant status: Secondary | ICD-10-CM | POA: Insufficient documentation

## 2015-06-27 DIAGNOSIS — Z9221 Personal history of antineoplastic chemotherapy: Secondary | ICD-10-CM | POA: Diagnosis not present

## 2015-06-27 DIAGNOSIS — N189 Chronic kidney disease, unspecified: Secondary | ICD-10-CM | POA: Insufficient documentation

## 2015-06-27 DIAGNOSIS — Z79899 Other long term (current) drug therapy: Secondary | ICD-10-CM | POA: Insufficient documentation

## 2015-06-27 DIAGNOSIS — E1122 Type 2 diabetes mellitus with diabetic chronic kidney disease: Secondary | ICD-10-CM | POA: Diagnosis not present

## 2015-06-27 DIAGNOSIS — F0390 Unspecified dementia without behavioral disturbance: Secondary | ICD-10-CM | POA: Diagnosis not present

## 2015-06-27 DIAGNOSIS — I4892 Unspecified atrial flutter: Secondary | ICD-10-CM | POA: Insufficient documentation

## 2015-06-27 DIAGNOSIS — E039 Hypothyroidism, unspecified: Secondary | ICD-10-CM | POA: Diagnosis not present

## 2015-06-27 DIAGNOSIS — I480 Paroxysmal atrial fibrillation: Secondary | ICD-10-CM

## 2015-06-27 DIAGNOSIS — Z87891 Personal history of nicotine dependence: Secondary | ICD-10-CM | POA: Insufficient documentation

## 2015-06-27 DIAGNOSIS — C8590 Non-Hodgkin lymphoma, unspecified, unspecified site: Secondary | ICD-10-CM | POA: Insufficient documentation

## 2015-06-27 DIAGNOSIS — D696 Thrombocytopenia, unspecified: Secondary | ICD-10-CM | POA: Diagnosis not present

## 2015-06-27 DIAGNOSIS — K219 Gastro-esophageal reflux disease without esophagitis: Secondary | ICD-10-CM | POA: Insufficient documentation

## 2015-06-27 LAB — CBC
HCT: 26.5 % — ABNORMAL LOW (ref 36.0–46.0)
Hemoglobin: 8.4 g/dL — ABNORMAL LOW (ref 12.0–15.0)
MCH: 35.4 pg — AB (ref 26.0–34.0)
MCHC: 31.7 g/dL (ref 30.0–36.0)
MCV: 111.8 fL — ABNORMAL HIGH (ref 78.0–100.0)
PLATELETS: 41 10*3/uL — AB (ref 150–400)
RBC: 2.37 MIL/uL — AB (ref 3.87–5.11)
RDW: 15.4 % (ref 11.5–15.5)
WBC: 4.8 10*3/uL (ref 4.0–10.5)

## 2015-06-27 LAB — BASIC METABOLIC PANEL
Anion gap: 9 (ref 5–15)
BUN: 15 mg/dL (ref 6–20)
CALCIUM: 10.3 mg/dL (ref 8.9–10.3)
CO2: 29 mmol/L (ref 22–32)
CREATININE: 1.3 mg/dL — AB (ref 0.44–1.00)
Chloride: 102 mmol/L (ref 101–111)
GFR calc non Af Amer: 42 mL/min — ABNORMAL LOW (ref >60)
GFR, EST AFRICAN AMERICAN: 48 mL/min — AB (ref >60)
GLUCOSE: 141 mg/dL — AB (ref 65–99)
Potassium: 3.7 mmol/L (ref 3.5–5.1)
Sodium: 140 mmol/L (ref 135–145)

## 2015-06-27 MED ORDER — FUROSEMIDE 40 MG PO TABS: 40.0000 mg | ORAL_TABLET | ORAL | Status: AC | PRN

## 2015-06-27 MED ORDER — POTASSIUM CHLORIDE ER 10 MEQ PO TBCR: 10.0000 meq | EXTENDED_RELEASE_TABLET | ORAL | Status: AC | PRN

## 2015-06-27 NOTE — Patient Instructions (Signed)
If weight is 130 lb or greater take Furosemide (Lasix) 40 mg daily and Potassium 10 meq daily UNTIL weight returns below 130 lb  Labs today  Your physician recommends that you schedule a follow-up appointment in: 1 month

## 2015-06-28 ENCOUNTER — Ambulatory Visit: Payer: Medicare Other | Admitting: Rehabilitation

## 2015-06-28 ENCOUNTER — Encounter: Payer: Self-pay | Admitting: Rehabilitation

## 2015-06-28 DIAGNOSIS — R269 Unspecified abnormalities of gait and mobility: Secondary | ICD-10-CM | POA: Diagnosis not present

## 2015-06-28 DIAGNOSIS — R531 Weakness: Secondary | ICD-10-CM

## 2015-06-28 DIAGNOSIS — R2681 Unsteadiness on feet: Secondary | ICD-10-CM

## 2015-06-28 NOTE — Patient Instructions (Signed)
SINGLE LIMB STANCE    Stance: single leg on floor. Stand in corner and have chair in front of you for safety.  Also have supervision from husband or caregiver for safety.  Raise leg. Hold _10__ seconds. Repeat with other leg. __3_ reps per set, _1__ sets per day, _5__ days per week  Copyright  VHI. All rights reserved.   Feet Together, Arm Motion - Eyes Closed    Standing in corner with chair in front of you.  Stand with eyes closed and feet together. Hold for  _20 seconds, do 2__ times per session. Do __1__ sessions per day.  Copyright  VHI. All rights reserved.   Feet Together (Compliant Surface) Head Motion - Eyes Open    Stand in corner with chair in front of you.  With eyes open, standing on compliant surface (use pillow or couch cushion, just make sure when you stand on it, your feet don't bottom out on the floor.  Place feet together, move head slowly: up and down x 10 reps and then side to side x 10 reps.  Use single hand on chair, working towards lightening your grip on the chair.  Again, have supervision from caregiver or family for safety.   Do _1___ sessions per day.  Copyright  VHI. All rights reserved.   Feet Together (Compliant Surface) Arm Motion - Eyes Closed    Stand in corner with chair in front of you.  Stand on compliant surface (pillows or cushion): Stand with feet together. Close eyes and hold for 20 secs.  Repeat __3__ times per session. Do _1___ sessions per day.  Copyright  VHI. All rights reserved.

## 2015-06-28 NOTE — Therapy (Signed)
[facility] [address] [place], [place], [address] Phone: [phone]   Fax:  [phone]  Physical Therapy Treatment  Patient Details  Name: Victoria Wood MRN: [id number] Date of Birth: 01/08/1951 No Data Recorded  Encounter Date: 06/28/2015      PT End of Session - 06/28/15 1138    Visit Number 7   Number of Visits 17   Date for PT Re-Evaluation 07/22/15   Authorization Type G code every 10th visit   PT Start Time 0930   PT Stop Time 1015   PT Time Calculation (min) 45 min   Activity Tolerance Patient tolerated treatment well   Behavior During Therapy [facility] for tasks assessed/performed      Past Medical History  Diagnosis Date  . Gastric polyps     History of  . GERD (gastroesophageal reflux disease)   . Asthma   . Anemia   . Hyperlipidemia   . DM type 2 (diabetes mellitus, type 2) ([place])   . Autologous bone marrow transplantation status ([place]) 01/10/2012  . Hypomagnesemia 01/13/2012  . Non Hodgkin's lymphoma ([place])     "5 times since she was 27" (03/02/2015)  . Cancer ([place])     hx of Rituxan in 07/2011  . Complication of anesthesia     has woken up during surgery   . History of blood transfusion "several"    "related to her chemo"  . Dementia     past early stage/spouse (03/02/2015)  . Depression     Past Surgical History  Procedure Laterality Date  . Portacath placement  X 2  . Upper gastrointestinal endoscopy  02/2011    gastric polyps x 2  . Lymph node dissection      neck and thigh surgery  . Esophagogastroduodenoscopy  08/22/2011    Procedure: ESOPHAGOGASTRODUODENOSCOPY (EGD);  Surgeon: Inda Castle, MD;  Location: [facility];  Service: Endoscopy;  Laterality: N/A;  . Lymph node biopsy Left 10/02/11    axillary  . Reduction mammaplasty Bilateral   . Bone marrow transplant  ~ 2014    "@ [facility]"    There were no vitals filed for this visit.  Visit Diagnosis:   Unsteadiness  Weakness  Abnormality of gait      Subjective Assessment - 06/28/15 0934    Subjective Pt and caregiver report that [place] trip went well.  She also reports using rollator and being active.  No falls since last visit, reports did not come last visit due to being tired from [place] trip.    Pertinent History Non-Hodgkins Lymphoma, CHF, afib, GERD   Limitations Walking;House hold activities   Patient Stated Goals To become as independent as possible (per friend)   Currently in Pain? Yes   Pain Score 8    Pain Location Abdomen   Pain Orientation Lower   Pain Descriptors / Indicators Aching   Pain Type Acute pain   Pain Onset Today   Pain Frequency Intermittent   Aggravating Factors  walking   Pain Relieving Factors resting, sitting              NMR:  Provided pt with corner exercises for balance with narrow BOS, EC and SLS with single UE support, see pt instruction for detail. Progressed to standing on compliant surfaces with feet together, EC, feet tandem with and without head movements, feet together with head movements, added exercises as appropriate, see pt instruction for full details.  Held all positions for 20 secs,  with exception of SLS in which pt only able to tolerate 10 secs with single UE support due to severe LOB.  Provided education to caregiver on how to set up at home in this manner in corner for increased safety.  Both verbalized understanding.   Therex:  Performed all standing PWR! Moves x 20 reps first move and 10 reps each direction all other moves.  Tolerated well.  See navigator.                PWR [facility]) - 06/28/15 1008    PWR! exercises Moves in standing   PWR! Up x 20 reps   PWR! Rock x 10 reps each direction   PWR! Twist x 10 reps each direction   PWR Step x 10 reps each direction   Comments Note marked improvement in balance, requires heavy cues during rotation movements.               PT Education - 06/28/15 [phone]     Education provided Yes   Education Details Provided education to pt and caregiver that PT would be contacting referring MD regarding barriers to therapy.    Person(s) Educated Patient   Methods Explanation   Comprehension Verbalized understanding          PT Short Term Goals - 06/19/15 0939    PT SHORT TERM GOAL #1   Title Pt and family/friends will verbalize fall prevention strategies to indicate decreased fall risk at home.  (Target Date: 06/20/15)   Baseline achieved on 06/19/15   Status Achieved   PT SHORT TERM GOAL #2   Title Pt will improve DGI score to 15/24 to indicate decreased fall risk during functional mobility.  (Target Date: 06/20/15)   Baseline 12/24 on 06/19/15   Status Not Met   PT SHORT TERM GOAL #3   Title Pt and family/friends will initiate HEP to improve functional strength and balance to decrease fall risk.  (Target Date: 06/20/15)   Baseline met 06/19/15   Status Achieved   PT SHORT TERM GOAL #4   Title Pt will ambulate 200' without AD while negotiating around and over obstacles without LOB in order to safely traverse home environment at S level.  (Target Date: 06/20/15)   Baseline Performed 250' without AD at close S level, continue to recommend close S for all gait at home.     Status Achieved   PT SHORT TERM GOAL #5   Title Pt perform 6MWT to assess functional endurance. (Target Date: 06/20/15)   Baseline 672' on 06/19/15   Status Achieved           PT Long Term Goals - 05/23/15 1106    PT LONG TERM GOAL #1   Title Pt will improve DGI score to 18/24 to indicate functional decrease in fall risk at home.  (Target date: 07/18/15)   Time 8   Period Weeks   PT LONG TERM GOAL #2   Title Pt will perform HEP with min cues from family for improved functional strength and balance to indicate decreased fall risk.  (Target date: 07/18/15)   Time 8   Period Weeks   PT LONG TERM GOAL #3   Title Pt will ambulate >300' on outdoor surfaces including grass,  ramp, curb without AD at S level to indicate safe negotiation in community.  (Target date: 07/18/15)   Time 8   Period Weeks   PT LONG TERM GOAL #4   Title Pt will increase 6MWT distance by  3' to indicate improvement in functional endurance.   (Target date: 07/18/15)   Time 8   Period Weeks   PT LONG TERM GOAL #5   Title Pt will report no falls in last 4 weeks to indicate improved functional balance and safety in home and community.  (Target date: 07/18/15)   Time 8   Period Weeks               Plan - 06/28/15 1138    Clinical Impression Statement Skilled session focused on corner balance tasks with narrowing BOS, compliant surfaces with EO and EC for increased challenge as appropriate.  Provided these for HEP with instruction to caregiver to alternate days for PWR! moves and balance.  See pt instruction for details.  Also went over PWR! exercises in standing.  Tolerated well with improved balance noted.  Note PT called PCP Vicenta Aly regarding pts progress and barriers, she is to return to office next week.     Pt will benefit from skilled therapeutic intervention in order to improve on the following deficits Abnormal gait;Cardiopulmonary status limiting activity;Decreased activity tolerance;Decreased balance;Decreased cognition;Decreased coordination;Decreased endurance;Decreased knowledge of precautions;Decreased knowledge of use of DME;Decreased mobility;Decreased safety awareness;Decreased strength;Dizziness;Impaired perceived functional ability   Rehab Potential Good   Clinical Impairments Affecting Rehab Potential History of dementia   PT Frequency 2x / week   PT Duration 8 weeks   PT Treatment/Interventions ADLs/Self Care Home Management;DME Instruction;Gait training;Stair training;Functional mobility training;Therapeutic activities;Therapeutic exercise;Balance training;Neuromuscular re-education;Cognitive remediation;Patient/family education;Energy conservation;Vestibular    PT Next Visit Plan balance, forward weight shifts, rocker board (small one), narrowing BOS, add wall bumps to HEP   PT Home Exercise Plan see pt instruction   Consulted and Agree with Plan of Care Patient;Family member/caregiver   Family Member Consulted caregiver        Problem List Patient Active Problem List   Diagnosis Date Noted  . Paroxysmal atrial fibrillation ([place]) 04/11/2015  . Chronic systolic CHF (congestive heart failure) ([place]) 04/11/2015  . Persistent atrial fibrillation ([place])   . Palliative care encounter   . Acute systolic heart failure ([place]) 03/03/2015  . Sinus tachycardia ([place]) 03/03/2015  . Dementia without behavioral disturbance 03/03/2015  . SOB (shortness of breath) 03/01/2015  . Pancytopenia 05/21/2012  . Hypomagnesemia 01/13/2012  . 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 Insomnia 12/03/2011  . Hepatitis cholestatic 09/12/2011  . Epigastric pain 08/21/2011  . Non-Hodgkin's lymphoma ([place])   . GERD (gastroesophageal reflux disease)   . HTN (hypertension)   . DM type 2 (diabetes mellitus, type 2) ([place])     Cameron Sprang, PT, MPT [facility] [address] [place], [place], [address] Phone: [phone]   Fax:  [phone] 06/28/2015, 11:43 AM  Name: Victoria Wood MRN: [id number] Date of Birth: [birth year]

## 2015-06-28 NOTE — Progress Notes (Signed)
Patient ID: Victoria Wood, female   DOB: 08-Sep-1951, 64 y.o.   MRN: [id number]  ADVANCED HF CLINIC NOTE  Patient ID: Victoria Wood, female   DOB: [birth year], 64 y.o.   MRN: [id number] PCP: Vicenta Aly HF Cardiologist: Aundra Dubin  64 yo with long history of dementia, non-Hodgkin's lymphoma (since age 63) with multiple rounds of chemo and history of autologous bone marrow transplant was admitted in 6/16 with acute systolic CHF. She had R-CHOP and then Rituxan/bendamustine more recently, which has been completed. She had a PET scan not long ago, and she was told that her cancer is non-active. Echo in 6/16 showed EF 15-20%, diffuse hypokinesis, moderate MR, mild AI.  She went into atrial fibrillation with RVR while in the hospital.  She converted back to NSR with amiodarone.  We did not start long-term anticoagulation due to thrombocytopenia and fall risk.  Low blood pressure limited medication titration.  She returns today for regular HF follow up.  At last appointment, we held Lasix with AKI and dehydration.  Lisinopril was also held.  BP is stable today in the 90s (her baseline).  Weight has been stable at home.  No exertional dyspnea.  No lightheadedness.  Some general fatigue.  No orthopnea/PND.    Labs (7/16): K 3.4, creatinine 1.13, hgb 8.8, plts 44K Labs (8/16): K 4.0 creatinine 1.37 dig 0.8, LFTs normal Labs (9/16): K 4.4 => 5.1, creatinine 2.40 => 1.88, digoxin 0.6 => 0.8, BNP 367, TSH 15, hgb 8.8  PMH: 1. Atrial fibrillation: Paroxysmal, she is on amiodarone.  No anticoagulation with fall risk/thrombocytopenia. 2. Non-Hodgkins lymphoma: Has been followed in [place].  She has had the diagnosis since age 64 with multiple rounds of chemo and history of autologous bone marrow transplant. She had R-CHOP and then Rituxan/bendamustine more recently, which has been completed. She had a PET scan not long ago, and she was told that her cancer is non-active. 3. Cardiomyopathy: Echo (6/16) with EF  15-20%, mildly dilated LV, moderate MR.  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 Echo (9/16) with EF 35%, normal RV size and systolic function.  4.  Dementia 5. Thrombocytopenia: Chronic.  6. Type II diabetes.  7. GERD 8. Depression 9. Hyperlipidemia 10. CKD 11. Hypothyroidism  SH: Married, lives with husband part of the year in [place] and part of the year in [place].  Has children.  Prior smoker.    FH: No premature CAD  ROS: All systems reviewed and negative except as per HPI.    Current Outpatient Prescriptions  Medication Sig Dispense Refill  . acetaminophen (TYLENOL) 500 MG tablet Take 500 mg by mouth every 6 (six) hours as needed for mild pain.    Marland Kitchen acyclovir (ZOVIRAX) 400 MG tablet Take 400 mg by mouth 2 (two) times daily.    Marland Kitchen albuterol (PROVENTIL HFA;VENTOLIN HFA) 108 (90 BASE) MCG/ACT inhaler Inhale 2 puffs into the lungs every 6 (six) hours as needed. Wheezing     . allopurinol (ZYLOPRIM) 100 MG tablet Take 100 mg by mouth daily.  4  . amiodarone (PACERONE) 200 MG tablet Take 1 tablet (200 mg total) by mouth daily. 30 tablet 3  . [employer] FIBER GUMMIES PO Take 1 tablet by mouth daily.    . digoxin (LANOXIN) 0.125 MG tablet Take 0.5 tablets (0.0625 mg total) by mouth daily. 30 tablet 6  . ivabradine (CORLANOR) 7.5 MG TABS tablet Take 1 tablet (7.5 mg total) by mouth 2 (two) times daily with a meal. 60 tablet 5  .  levothyroxine (SYNTHROID, LEVOTHROID) 88 MCG tablet Take 88 mcg by mouth daily before breakfast.    . Magnesium Cl-Calcium Carbonate (SLOW-MAG PO) Take 1 tablet by mouth daily.     Marland Kitchen MELATONIN PO Take 1 tablet by mouth at bedtime.    Marland Kitchen venlafaxine XR (EFFEXOR-XR) 75 MG 24 hr capsule Take 75 mg by mouth 2 (two) times daily.    . furosemide (LASIX) 40 MG tablet Take 1 tablet (40 mg total) by mouth as needed (for weight 130 lb or greater). For weight greater than 130 lbs. 30 tablet 3  . potassium chloride (K-DUR) 10 MEQ tablet Take 1 tablet (10 mEq  total) by mouth as needed (when you take Furosemide). Only when you take a Lasix 30 tablet 3   No current facility-administered medications for this encounter.   BP 96/48 mmHg  Pulse 80  Wt 131 lb (59.421 kg)  SpO2 94%   General: NAD Neck: No JVD, +RIJ port-a-cath. no thyromegaly or thyroid nodule noted.  Lungs: CTA bilaterally with normal respiratory effort. CV: PMI nondisplaced.  Heart regular S1/S2, no S3/S4, 2/6 HSM apex.  No peripheral edema.  No carotid bruit.  Normal pedal pulses.  Abdomen: Soft, nontender, no HSM, no distention.  Skin: Intact without lesions or rashes.  Neurologic: Alert and oriented x 3.  Psych: Normal affect. Extremities: No clubbing or cyanosis.  HEENT: Normal.   Assessment/Plan: 1.  Chronic systolic CHF: EF 70-26% by echo 9/16. Improved from 15-20% by 6/16 echo. 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 HR improved on Corlanor and BP stable. She appears euvolemic.  Lisinopril and Lasix recently held for hypotension/dehydration. - Continue digoxin, recent level ok.  - Continue Corlanor 7.5 bid. No b-blocker yet due to low BP.  - Use lasix prn for weights 130 or greater (has not needed). - Check BMET today and restart lisinopril at 2.5 mg qhs if creatinine is improved.  - She has a cardiomyopathy likely from doxorubicin cardiac toxicity. She would not be a transplant candidate. With her dementia and NHL as well as questionable functional status,  she would likely not be a good LVAD or ICD candidate.  2. Atrial fibrillation/flutter: New onset in 6/16 in hospital. She is a poor long-term anticoagulation candidate with significant thrombocytopenia and fairly high fall risk. She converted over to NSR and remains in NSR today.  - Continue amiodarone to 200 mg daily.   She will need regular eye exams. LFTs normal in 8/16.  Has hypothyroidism followed by PCP.  3. NHL: Completed most recent chemo regimen. Per husband, had PET scan showing that  disease is "non-active."  4. Dementia: x several years. She is on Aricept.  5. Thrombocytopenia: Chronic, mainly in the 40K range.  56k on 06/07/15 6. Hypothyroidism: May be related to [facility]. Started synthroid 04/2015.  Recent TSH elevated but down from previous. 7. CKD: Recheck creatinine today, recent risk in the setting of hypotension.  Labs today.  Follow up 2 weeks.  Loralie Champagne 06/28/2015

## 2015-06-29 ENCOUNTER — Ambulatory Visit: Payer: Medicare Other | Admitting: Rehabilitation

## 2015-06-29 ENCOUNTER — Telehealth (HOSPITAL_COMMUNITY): Payer: Self-pay

## 2015-06-29 DIAGNOSIS — R269 Unspecified abnormalities of gait and mobility: Secondary | ICD-10-CM | POA: Diagnosis not present

## 2015-06-29 DIAGNOSIS — R2681 Unsteadiness on feet: Secondary | ICD-10-CM

## 2015-06-29 DIAGNOSIS — R531 Weakness: Secondary | ICD-10-CM

## 2015-06-29 NOTE — Therapy (Signed)
[place] [address] [name], [place], [address] Phone: [phone]   Fax:  [phone]  Physical Therapy Treatment  Patient Details  Name: Victoria Wood MRN: [id number] Date of Birth: March 05, 1951 No Data Recorded  Encounter Date: 06/29/2015      PT End of Session - 06/29/15 1356    Visit Number 8   Number of Visits 17   Date for PT Re-Evaluation 07/22/15   Authorization Type G code every 10th visit   PT Start Time 1315   PT Stop Time 1355  ended early due to BP issues   PT Time Calculation (min) 40 min   Activity Tolerance Patient tolerated treatment well   Behavior During Therapy [facility] for tasks assessed/performed      Past Medical History  Diagnosis Date  . Gastric polyps     History of  . GERD (gastroesophageal reflux disease)   . Asthma   . Anemia   . Hyperlipidemia   . DM type 2 (diabetes mellitus, type 2) ([place])   . Autologous bone marrow transplantation status ([place]) 01/10/2012  . Hypomagnesemia 01/13/2012  . Non Hodgkin's lymphoma ([facility])     "5 times since she was 27" (03/02/2015)  . Cancer ([place])     hx of Rituxan in 07/2011  . Complication of anesthesia     has woken up during surgery   . History of blood transfusion "several"    "related to her chemo"  . Dementia     past early stage/spouse (03/02/2015)  . Depression     Past Surgical History  Procedure Laterality Date  . Portacath placement  X 2  . Upper gastrointestinal endoscopy  02/2011    gastric polyps x 2  . Lymph node dissection      neck and thigh surgery  . Esophagogastroduodenoscopy  08/22/2011    Procedure: ESOPHAGOGASTRODUODENOSCOPY (EGD);  Surgeon: Inda Castle, MD;  Location: [facility];  Service: Endoscopy;  Laterality: N/A;  . Lymph node biopsy Left 10/02/11    axillary  . Reduction mammaplasty Bilateral   . Bone marrow transplant  ~ 2014    "@ [facility]"    There were no vitals filed for this visit.  Visit  Diagnosis:  Unsteadiness  Abnormality of gait  Weakness      Subjective Assessment - 06/29/15 1318    Subjective "I'm still not feeling very well, my stomach hurts."  Caregiver reports that pt is back on Lisinopril and Lasix per MD report.     Pertinent History Non-Hodgkins Lymphoma, CHF, afib, GERD   Limitations Walking;House hold activities   Patient Stated Goals To become as independent as possible (per friend)   Currently in Pain? Yes   Pain Score 4    Pain Location Abdomen   Pain Orientation Lower   Pain Descriptors / Indicators Cramping   Pain Type Acute pain   Pain Onset Yesterday   Pain Frequency Intermittent   Aggravating Factors  walking   Pain Relieving Factors resting, sitting            NMR:  Addressed high level balance and SLS while tapping to cones alternating LEs while standing on red mat for increased balance challenge.  Progressed to tipping cone over and back up alternating LEs in order to increase time in SLS during task.  Requires min/guard to min A, esp when tapping LLE to cone as pt tends to demonstrate decreased weight shift to the R.  Progressed to stepping over  cones with each LE stepping for increased hip and knee flexion as well as increased SLS to challenge balance.  Performed x 15' x 4 reps with min A to prevent overt LOB.  Progressed to corner tasks while on small rocker board in vertical and horizontal directions.  Began with eyes open, EO w/ head turns up/down and side/side, EC.  Note that pt unable to maintain balance at all with EC, despite max verbal and tactile cues for increased ankle and hip strategy.   NOTE:  Following NMR exercises in corner, pt stating increased dizziness.  BP was 82/43, upon re-checking approx 3 mins later, BP was 78/43.  Discussed with caregiver regarding therapy parameters and being unable to work with pt when BP under 80.  Re-checked again, BP was 76/40 following another 3-4 mins.  Recommended that caregiver notify MD  and family ASAP.  Provided caregiver and pt with option of PT called 911 for ambulance transport to ED, however caregiver would like to hear from husband before carrying to hospital.  Caregiver feels comfortable taking pt to car, therefore ended session early.                    [facility]) - 06/28/15 1008    PWR! exercises Moves in standing   PWR! Up x 20 reps   PWR! Rock x 10 reps each direction   PWR! Twist x 10 reps each direction   PWR Step x 10 reps each direction   Comments Note marked improvement in balance, requires heavy cues during rotation movements.               PT Education - 06/29/15 1355    Education provided Yes   Education Details Educated on calling MD and family ASAP in order to notify them of BP issues during session as they may want her to go to ED.    Person(s) Educated Patient;Caregiver(s)   Methods Explanation   Comprehension Verbalized understanding          PT Short Term Goals - 06/19/15 0939    PT SHORT TERM GOAL #1   Title Pt and family/friends will verbalize fall prevention strategies to indicate decreased fall risk at home.  (Target Date: 06/20/15)   Baseline achieved on 06/19/15   Status Achieved   PT SHORT TERM GOAL #2   Title Pt will improve DGI score to 15/24 to indicate decreased fall risk during functional mobility.  (Target Date: 06/20/15)   Baseline 12/24 on 06/19/15   Status Not Met   PT SHORT TERM GOAL #3   Title Pt and family/friends will initiate HEP to improve functional strength and balance to decrease fall risk.  (Target Date: 06/20/15)   Baseline met 06/19/15   Status Achieved   PT SHORT TERM GOAL #4   Title Pt will ambulate 200' without AD while negotiating around and over obstacles without LOB in order to safely traverse home environment at S level.  (Target Date: 06/20/15)   Baseline Performed 250' without AD at close S level, continue to recommend close S for all gait at home.     Status Achieved   PT SHORT  TERM GOAL #5   Title Pt perform 6MWT to assess functional endurance. (Target Date: 06/20/15)   Baseline 672' on 06/19/15   Status Achieved           PT Long Term Goals - 05/23/15 1106    PT LONG TERM GOAL #1   Title Pt will  improve DGI score to 18/24 to indicate functional decrease in fall risk at home.  (Target date: 07/18/15)   Time 8   Period Weeks   PT LONG TERM GOAL #2   Title Pt will perform HEP with min cues from family for improved functional strength and balance to indicate decreased fall risk.  (Target date: 07/18/15)   Time 8   Period Weeks   PT LONG TERM GOAL #3   Title Pt will ambulate >300' on outdoor surfaces including grass, ramp, curb without AD at S level to indicate safe negotiation in community.  (Target date: 07/18/15)   Time 8   Period Weeks   PT LONG TERM GOAL #4   Title Pt will increase 6MWT distance by 191' to indicate improvement in functional endurance.   (Target date: 07/18/15)   Time 8   Period Weeks   PT LONG TERM GOAL #5   Title Pt will report no falls in last 4 weeks to indicate improved functional balance and safety in home and community.  (Target date: 07/18/15)   Time 8   Period Weeks               Plan - 06/29/15 1357    Clinical Impression Statement Skilled session focused on high level balance while on compliant surface for SLS as well as corner balance tasks on small rocker board.  Note that during session, pt reports feeling dizzy, therefore checked BP and was 82/43, following 3 mins checked again and was 78/43.  Educated pt and caregiver on BP parameters and recommendations for calling MD and family.  Caregiver verbalized understanding with pts ending BP was 76/40.     Pt will benefit from skilled therapeutic intervention in order to improve on the following deficits Abnormal gait;Cardiopulmonary status limiting activity;Decreased activity tolerance;Decreased balance;Decreased cognition;Decreased coordination;Decreased endurance;Decreased  knowledge of precautions;Decreased knowledge of use of DME;Decreased mobility;Decreased safety awareness;Decreased strength;Dizziness;Impaired perceived functional ability   Rehab Potential Good   Clinical Impairments Affecting Rehab Potential History of dementia   PT Frequency 2x / week   PT Duration 8 weeks   PT Treatment/Interventions ADLs/Self Care Home Management;DME Instruction;Gait training;Stair training;Functional mobility training;Therapeutic activities;Therapeutic exercise;Balance training;Neuromuscular re-education;Cognitive remediation;Patient/family education;Energy conservation;Vestibular   PT Next Visit Plan balance, forward weight shifts, rocker board (small one), narrowing BOS, add wall bumps to HEP   Consulted and Agree with Plan of Care Patient;Family member/caregiver   Family Member Consulted caregiver        Problem List Patient Active Problem List   Diagnosis Date Noted  . Paroxysmal atrial fibrillation ([place]) 04/11/2015  . Chronic systolic CHF (congestive heart failure) ([place]) 04/11/2015  . Persistent atrial fibrillation ([place])   . Palliative care encounter   . Acute systolic heart failure ([place]) 03/03/2015  . Sinus tachycardia ([place]) 03/03/2015  . Dementia without behavioral disturbance 03/03/2015  . SOB (shortness of breath) 03/01/2015  . Pancytopenia 05/21/2012  . Hypomagnesemia 01/13/2012  . 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 Insomnia 12/03/2011  . Hepatitis cholestatic 09/12/2011  . Epigastric pain 08/21/2011  . Non-Hodgkin's lymphoma ([place])   . GERD (gastroesophageal reflux disease)   . HTN (hypertension)   . DM type 2 (diabetes mellitus, type 2) ([place])     Cameron Sprang, PT, MPT [facility] [address] [place], [place], [address] Phone: [phone]   Fax:  [phone] 06/29/2015, 2:00 PM  Name: Victoria Wood MRN: [id number] Date of Birth: [birth year]

## 2015-06-29 NOTE — Telephone Encounter (Signed)
Husband  Called from [place] about his wife at home in [place] being attended by a combination of daughter and nanny.  He blood pressure was 98/ this morning.  Had lasix last night and started back on lisinopril  Went to PT around noon but they would not do it because she was complaining of dizziness and her BP was running in the 70's.  Now her blood pressure is running in the 90's, no dizziness just feels blah.  Advised husband to hold lisinopril and instructed him on the lasix parameters as ordered   Routing to Dr. Aundra Dubin

## 2015-06-29 NOTE — Telephone Encounter (Signed)
Would have them try cutting lisinopril to 1.25 mg daily.  If BP still low with this, then stop it.

## 2015-06-30 NOTE — Telephone Encounter (Signed)
Patients blood pressure was excellent last night husband said; 100/70. Instructed patient's husband to decrease Lisinopril 1.25 mg to once a day.  If blood pressure still low then stop it.

## 2015-07-03 ENCOUNTER — Ambulatory Visit: Payer: Medicare Other | Admitting: Rehabilitation

## 2015-07-05 ENCOUNTER — Telehealth (HOSPITAL_COMMUNITY): Payer: Self-pay | Admitting: *Deleted

## 2015-07-05 ENCOUNTER — Other Ambulatory Visit (HOSPITAL_COMMUNITY): Payer: Medicare Other

## 2015-07-05 ENCOUNTER — Ambulatory Visit: Payer: Medicare Other | Admitting: Physical Therapy

## 2015-07-05 VITALS — BP 73/38 | HR 69

## 2015-07-05 DIAGNOSIS — R2681 Unsteadiness on feet: Secondary | ICD-10-CM

## 2015-07-05 NOTE — Telephone Encounter (Signed)
Pt's  Husband called concerned about pt's BP, he states he was told by daughter it was ok this AM but then pt went to rehab and there it was down to 73/38, pt was c/o feeling dizzy.  Advised per previous phone note Dr Aundra Dubin stated if BP low again to stop Lisinopril, he is agreeable to this.  Also advised to have pt go home now and rest and drink extra glass of water today.  He is in agreement with plan and will continue to monitor BP.

## 2015-07-05 NOTE — Therapy (Signed)
[place] [address] [place], [place], [address] Phone: [phone]   Fax:  [phone]  Physical Therapy Treatment  Patient Details  Name: Victoria Wood MRN: [id number] Date of Birth: 11/18/1950 No Data Recorded  Encounter Date: 07/05/2015      PT End of Session - 07/05/15 0957    Visit Number 8  Arrived; no visit charge   Number of Visits 17   Date for PT Re-Evaluation 07/22/15   Authorization Type G code every 10th visit   PT Start Time 0932   PT Stop Time 0952   PT Time Calculation (min) 20 min   Activity Tolerance Treatment limited secondary to medical complications (Comment)  BP 73/38   Behavior During Therapy [facility] for tasks assessed/performed      Past Medical History  Diagnosis Date  . Gastric polyps     History of  . GERD (gastroesophageal reflux disease)   . Asthma   . Anemia   . Hyperlipidemia   . DM type 2 (diabetes mellitus, type 2) ([place])   . 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 Hypomagnesemia 01/13/2012  . Non Hodgkin's lymphoma ([place])     "5 times since she was 27" (03/02/2015)  . Cancer ([place])     hx of Rituxan in 07/2011  . Complication of anesthesia     has woken up during surgery   . History of blood transfusion "several"    "related to her chemo"  . Dementia     past early stage/spouse (03/02/2015)  . Depression     Past Surgical History  Procedure Laterality Date  . Portacath placement  X 2  . Upper gastrointestinal endoscopy  02/2011    gastric polyps x 2  . Lymph node dissection      neck and thigh surgery  . Esophagogastroduodenoscopy  08/22/2011    Procedure: ESOPHAGOGASTRODUODENOSCOPY (EGD);  Surgeon: Inda Castle, MD;  Location: [facility];  Service: Endoscopy;  Laterality: N/A;  . Lymph node biopsy Left 10/02/11    axillary  . Reduction mammaplasty Bilateral   . Bone marrow transplant  ~ 2014    "@ [facility]"    Filed Vitals:   07/05/15 [phone] 07/05/15 0943  BP: 90/41 73/38  Pulse: 67 69    Visit Diagnosis:  Unsteadiness      Subjective Assessment - 07/05/15 0938    Subjective "My stomach really hurts. You know, I fell down the stairs."   Pertinent History * Ok to continue therapy if ystolic BP > 80. Non-Hodgkins Lymphoma, CHF, afib, GERD                                 PT Education - 07/05/15 0955    Education provided Yes   Education Details Recommended pt/caregiver call MD to convey BP 73/38, symptoms, and pt-reported fall down stairs.   Person(s) Educated Patient;Other (comment)  friend, Opal Sidles   Methods Explanation;Handout   Comprehension Verbalized understanding          PT Short Term Goals - 06/19/15 0939    PT SHORT TERM GOAL #1   Title Pt and family/friends will verbalize fall prevention strategies to indicate decreased fall risk at home.  (Target Date: 06/20/15)   Baseline achieved on 06/19/15   Status Achieved   PT SHORT TERM GOAL #2   Title Pt will improve DGI score to 15/24 to indicate decreased fall  risk during functional mobility.  (Target Date: 06/20/15)   Baseline 12/24 on 06/19/15   Status Not Met   PT SHORT TERM GOAL #3   Title Pt and family/friends will initiate HEP to improve functional strength and balance to decrease fall risk.  (Target Date: 06/20/15)   Baseline met 06/19/15   Status Achieved   PT SHORT TERM GOAL #4   Title Pt will ambulate 200' without AD while negotiating around and over obstacles without LOB in order to safely traverse home environment at S level.  (Target Date: 06/20/15)   Baseline Performed 250' without AD at close S level, continue to recommend close S for all gait at home.     Status Achieved   PT SHORT TERM GOAL #5   Title Pt perform 6MWT to assess functional endurance. (Target Date: 06/20/15)   Baseline 672' on 06/19/15   Status Achieved           PT Long Term Goals - 05/23/15 1106    PT LONG TERM GOAL #1   Title  Pt will improve DGI score to 18/24 to indicate functional decrease in fall risk at home.  (Target date: 07/18/15)   Time 8   Period Weeks   PT LONG TERM GOAL #2   Title Pt will perform HEP with min cues from family for improved functional strength and balance to indicate decreased fall risk.  (Target date: 07/18/15)   Time 8   Period Weeks   PT LONG TERM GOAL #3   Title Pt will ambulate >300' on outdoor surfaces including grass, ramp, curb without AD at S level to indicate safe negotiation in community.  (Target date: 07/18/15)   Time 8   Period Weeks   PT LONG TERM GOAL #4   Title Pt will increase 6MWT distance by 191' to indicate improvement in functional endurance.   (Target date: 07/18/15)   Time 8   Period Weeks   PT LONG TERM GOAL #5   Title Pt will report no falls in last 4 weeks to indicate improved functional balance and safety in home and community.  (Target date: 07/18/15)   Time 8   Period Weeks               Plan - 07/05/15 5681    Clinical Impression Statement Session not initiated due to BP 73/38 and symptomatic (dizzy). Recommended to patient and friend, Opal Sidles to notify MD of BP reading, symptoms, and pt report of recent fall down the stairs. Pt reporting she is going to [place], where she will likely receive PT services. Asked pt to contact clinic to verify that this is plan prior to next scheduled PT session. Pt and friend verbalized understanding of all education.   Pt will benefit from skilled therapeutic intervention in order to improve on the following deficits Abnormal gait;Cardiopulmonary status limiting activity;Decreased activity tolerance;Decreased balance;Decreased cognition;Decreased coordination;Decreased endurance;Decreased knowledge of precautions;Decreased knowledge of use of DME;Decreased mobility;Decreased safety awareness;Decreased strength;Dizziness;Impaired perceived functional ability   Clinical Impairments Affecting Rehab Potential History of dementia    PT Frequency 2x / week   PT Duration 8 weeks   PT Treatment/Interventions ADLs/Self Care Home Management;DME Instruction;Gait training;Stair training;Functional mobility training;Therapeutic activities;Therapeutic exercise;Balance training;Neuromuscular re-education;Cognitive remediation;Patient/family education;Energy conservation;Vestibular   Consulted and Agree with Plan of Care Patient;Other (Comment)   Family Member Consulted friend, Opal Sidles        Problem List Patient Active Problem List   Diagnosis Date Noted  . Paroxysmal atrial fibrillation ([employer]) 04/11/2015  .  Chronic systolic CHF (congestive heart failure) ([place]) 04/11/2015  . Persistent atrial fibrillation ([place])   . Palliative care encounter   . Acute systolic heart failure ([place]) 03/03/2015  . Sinus tachycardia ([place]) 03/03/2015  . Dementia without behavioral disturbance 03/03/2015  . SOB (shortness of breath) 03/01/2015  . Pancytopenia 05/21/2012  . Hypomagnesemia 01/13/2012  . 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 Insomnia 12/03/2011  . Hepatitis cholestatic 09/12/2011  . Epigastric pain 08/21/2011  . Non-Hodgkin's lymphoma ([place])   . GERD (gastroesophageal reflux disease)   . HTN (hypertension)   . DM type 2 (diabetes mellitus, type 2) ([place])    Billie Ruddy, PT, [place] [address] [place], [place], [address] Phone: [phone]   Fax:  [phone] 07/05/2015, 10:02 AM   Name: Mckinzey Entwistle MRN: [id number] Date of Birth: December 22, 1950

## 2015-07-06 ENCOUNTER — Ambulatory Visit (HOSPITAL_COMMUNITY)
Admission: RE | Admit: 2015-07-06 | Discharge: 2015-07-06 | Disposition: A | Discharge status: Home / Self Care | Payer: Medicare Other | Source: Ambulatory Visit | Attending: Internal Medicine | Admitting: Internal Medicine

## 2015-07-06 DIAGNOSIS — I5022 Chronic systolic (congestive) heart failure: Secondary | ICD-10-CM

## 2015-07-06 LAB — BASIC METABOLIC PANEL
Anion gap: 13 (ref 5–15)
BUN: 16 mg/dL (ref 6–20)
CHLORIDE: 98 mmol/L — AB (ref 101–111)
CO2: 30 mmol/L (ref 22–32)
CREATININE: 1.41 mg/dL — AB (ref 0.44–1.00)
Calcium: 10.7 mg/dL — ABNORMAL HIGH (ref 8.9–10.3)
GFR calc non Af Amer: 38 mL/min — ABNORMAL LOW (ref >60)
GFR, EST AFRICAN AMERICAN: 44 mL/min — AB (ref >60)
Glucose, Bld: 156 mg/dL — ABNORMAL HIGH (ref 65–99)
POTASSIUM: 3.8 mmol/L (ref 3.5–5.1)
Sodium: 141 mmol/L (ref 135–145)

## 2015-07-10 ENCOUNTER — Ambulatory Visit: Payer: Medicare Other | Admitting: Rehabilitation

## 2015-07-12 ENCOUNTER — Ambulatory Visit: Payer: Medicare Other | Admitting: Rehabilitation

## 2015-07-17 ENCOUNTER — Encounter: Payer: Self-pay | Admitting: Physical Therapy

## 2015-07-17 ENCOUNTER — Ambulatory Visit: Payer: Medicare Other | Attending: Family Medicine | Admitting: Physical Therapy

## 2015-07-17 ENCOUNTER — Ambulatory Visit: Payer: Medicare Other | Admitting: Rehabilitation

## 2015-07-17 ENCOUNTER — Telehealth (HOSPITAL_COMMUNITY): Payer: Self-pay

## 2015-07-17 VITALS — BP 106/58 | HR 64

## 2015-07-17 DIAGNOSIS — R2681 Unsteadiness on feet: Secondary | ICD-10-CM | POA: Diagnosis not present

## 2015-07-17 DIAGNOSIS — R531 Weakness: Secondary | ICD-10-CM | POA: Insufficient documentation

## 2015-07-17 DIAGNOSIS — R269 Unspecified abnormalities of gait and mobility: Secondary | ICD-10-CM | POA: Diagnosis present

## 2015-07-17 NOTE — Therapy (Signed)
[facility] [address] [place], [place], [address] Phone: [phone]   Fax:  [phone]  Physical Therapy Treatment  Patient Details  Name: Victoria Wood MRN: [id number] Date of Birth: [birth year] No Data Recorded  Encounter Date: 07/17/2015      PT End of Session - 07/17/15 1114    Visit Number 9  g9   Number of Visits 17   Date for PT Re-Evaluation 07/22/15   Authorization Type G code every 10th visit   PT Start Time 1100   PT Stop Time 1145   PT Time Calculation (min) 45 min   Activity Tolerance Treatment limited secondary to medical complications (Comment)  BP 73/38   Behavior During Therapy WFL for tasks assessed/performed      Past Medical History  Diagnosis Date  . Gastric polyps     History of  . GERD (gastroesophageal reflux disease)   . Asthma   . Anemia   . Hyperlipidemia   . DM type 2 (diabetes mellitus, type 2) ([place])   . Autologous bone marrow transplantation status ([place]) 01/10/2012  . Hypomagnesemia 01/13/2012  . Non Hodgkin's lymphoma ([place])     "5 times since she was 27" (03/02/2015)  . Cancer ([place])     hx of Rituxan in 07/2011  . Complication of anesthesia     has woken up during surgery   . History of blood transfusion "several"    "related to her chemo"  . Dementia     past early stage/spouse (03/02/2015)  . Depression     Past Surgical History  Procedure Laterality Date  . Portacath placement  X 2  . Upper gastrointestinal endoscopy  02/2011    gastric polyps x 2  . Lymph node dissection      neck and thigh surgery  . Esophagogastroduodenoscopy  08/22/2011    Procedure: ESOPHAGOGASTRODUODENOSCOPY (EGD);  Surgeon: Inda Castle, MD;  Location: [facility];  Service: Endoscopy;  Laterality: N/A;  . Lymph node biopsy Left 10/02/11    axillary  . Reduction mammaplasty Bilateral   . Bone marrow transplant  ~ 2014    "@ [facility]"    Filed Vitals:   07/17/15 1107 07/17/15  1118  BP: 112/56 manually taken 106/58 manually taken  Pulse: 64     Visit Diagnosis:  Unsteadiness  Abnormality of gait  Weakness      Subjective Assessment - 07/17/15 1118    Subjective Spouse reports 1 fall about 2 days ago when entering house. She missed the step into the house (he forgot to point it out to her. No injuries, just some small bruises. Spouse assisted her up, did not hit her head. Also had one about 1.5 weeks ago at daughters home, going down the stairs by herself. Reports no injuries with this fall as well.                                          Currently in Pain? Yes   Pain Score 2    Pain Location Ankle   Pain Orientation Left   Pain Descriptors / Indicators Aching   Pain Type Acute pain   Pain Onset Today   Pain Frequency Intermittent   Aggravating Factors  walking   Pain Relieving Factors resting           [facility] Adult PT Treatment/Exercise - 07/17/15 1127  Transfers   Sit to Stand 7: Independent   Stand to Sit 7: Independent   Ambulation/Gait   Ambulation/Gait Yes   Ambulation/Gait Assistance 4: Min guard   Ambulation/Gait Assistance Details pt. needed cues on posture due to forward flexed posture at times and to for increased step length and foot clearace with gait.                               Ambulation Distance (Feet) 893 Feet  with 6 minute walk test;+ in gym with remainder of session   Assistive device None   Gait Pattern Poor foot clearance - left;Poor foot clearance - right;Trunk flexed;Decreased stride length;Shuffle;Narrow base of support   Ambulation Surface Level;Indoor   Stairs Yes   Stairs Assistance 5: Supervision   Stairs Assistance Details (indicate cue type and reason) no cues or assistance needed;increased time needed for safety.   Stair Management Technique Two rails;Alternating pattern;Forwards   Number of Stairs 4   Gait Comments 6 minute walk: 893 feet with no AD with min guard assist. 2 standing rest breaks taken.    Standardized Balance Assessment   Standardized Balance Assessment Dynamic Gait Index   Dynamic Gait Index   Level Surface Mild Impairment   Change in Gait Speed Mild Impairment   Gait with Horizontal Head Turns Mild Impairment   Gait with Vertical Head Turns Mild Impairment   Gait and Pivot Turn Severe Impairment   Step Over Obstacle Moderate Impairment   Step Around Obstacles Mild Impairment   Steps Mild Impairment   Total Score 13     Self Care: Verbally reviewed HEP with pt and spouse. No questions or issues noted. Reviewed fall prevention with pt and spouse due to recent falls.        PT Short Term Goals - 06/19/15 0939    PT SHORT TERM GOAL #1   Title Pt and family/friends will verbalize fall prevention strategies to indicate decreased fall risk at home.  (Target Date: 06/20/15)   Baseline achieved on 06/19/15   Status Achieved   PT SHORT TERM GOAL #2   Title Pt will improve DGI score to 15/24 to indicate decreased fall risk during functional mobility.  (Target Date: 06/20/15)   Baseline 12/24 on 06/19/15   Status Not Met   PT SHORT TERM GOAL #3   Title Pt and family/friends will initiate HEP to improve functional strength and balance to decrease fall risk.  (Target Date: 06/20/15)   Baseline met 06/19/15   Status Achieved   PT SHORT TERM GOAL #4   Title Pt will ambulate 200' without AD while negotiating around and over obstacles without LOB in order to safely traverse home environment at S level.  (Target Date: 06/20/15)   Baseline Performed 250' without AD at close S level, continue to recommend close S for all gait at home.     Status Achieved   PT SHORT TERM GOAL #5   Title Pt perform 6MWT to assess functional endurance. (Target Date: 06/20/15)   Baseline 672' on 06/19/15   Status Achieved           PT Long Term Goals - 07/17/15 1115    PT LONG TERM GOAL #1   Title Pt will improve DGI score to 18/24 to indicate functional decrease in fall risk at home.   (Target date: 07/18/15)   Baseline 07/17/15: pt scored 13/24 today (up 1 point from evaluation score  of 12/24).   Time --   Period --   Status Not Met   PT LONG TERM GOAL #2   Title Pt will perform HEP with min cues from family for improved functional strength and balance to indicate decreased fall risk.  (Target date: 07/18/15)   Baseline 07/17/15: spouse reports pt will do HEP with him, however is resistant with other caregivers. They did not have any questions at this time.   Time --   Period --   Status Achieved   PT LONG TERM GOAL #3   Title Pt will ambulate >300' on outdoor surfaces including grass, ramp, curb without AD at S level to indicate safe negotiation in community.  (Target date: 07/18/15)   Baseline 07/17/15: pt needing assist on indoor level surfaces without AD, therefore did not check this goal today.   Time --   Period --   Status Deferred   PT LONG TERM GOAL #4   Title Pt will increase 6MWT distance by 191' to indicate improvement in functional endurance.   (Target date: 07/18/15)   Baseline 07/17/15: pt increased distance from 672 feet to 893 feet today.   Time --   Period --   Status Achieved   PT LONG TERM GOAL #5   Title Pt will report no falls in last 4 weeks to indicate improved functional balance and safety in home and community.  (Target date: 07/18/15)   Baseline 07/17/15: Spouse/pt report 2 falls in the last 1.5 weeks.   Time --   Period --   Status Not Met           Plan - 07/17/15 1115    Clinical Impression Statement Pt's blood pressure at a safe level to work with PT today and monitored throughout session. No dizziness today. LTG's checked today due to pt leaving on Wed to go to [place] with spouse. Spouse reported they will return the week of Thanksgiving for a visit and then for good after the holiday's. Spouse is to call and let us know whether to hold the chart until Thanksgiving week or discharge if pt is returning in January.                                   Pt will benefit from skilled therapeutic intervention in order to improve on the following deficits Abnormal gait;Cardiopulmonary status limiting activity;Decreased activity tolerance;Decreased balance;Decreased cognition;Decreased coordination;Decreased endurance;Decreased knowledge of precautions;Decreased knowledge of use of DME;Decreased mobility;Decreased safety awareness;Decreased strength;Dizziness;Impaired perceived functional ability   Clinical Impairments Affecting Rehab Potential History of dementia   PT Frequency 2x / week   PT Duration 8 weeks   PT Treatment/Interventions ADLs/Self Care Home Management;DME Instruction;Gait training;Stair training;Functional mobility training;Therapeutic activities;Therapeutic exercise;Balance training;Neuromuscular re-education;Cognitive remediation;Patient/family education;Energy conservation;Vestibular   PT Next Visit Plan hold for 2 weeks vs discharge after today, pt's spouse is to call the clinic and let us know.    Consulted and Agree with Plan of Care Patient;Other (Comment)   Family Member Consulted friend, Opal Sidles        Problem List Patient Active Problem List   Diagnosis Date Noted  . Paroxysmal atrial fibrillation ([place]) 04/11/2015  . Chronic systolic CHF (congestive heart failure) ([place]) 04/11/2015  . Persistent atrial fibrillation ([place])   . Palliative care encounter   . Acute systolic heart failure ([place]) 03/03/2015  . Sinus tachycardia ([place]) 03/03/2015  . Dementia without behavioral disturbance 03/03/2015  .  SOB (shortness of breath) 03/01/2015  . Pancytopenia 05/21/2012  . Hypomagnesemia 01/13/2012  . 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 Insomnia 12/03/2011  . Hepatitis cholestatic 09/12/2011  . Epigastric pain 08/21/2011  . Non-Hodgkin's lymphoma ([place])   . GERD (gastroesophageal reflux disease)   . HTN (hypertension)   . DM type 2 (diabetes mellitus, type 2) ([place])     Willow Ora 07/17/2015, 4:26 PM  Willow Ora, PTA, [facility] [address] [place], [place] [address] [phone] 07/17/2015, 4:26 PM   Name: Victoria Wood MRN: [id number] Date of Birth: 1951/03/13

## 2015-07-17 NOTE — Telephone Encounter (Signed)
Called husband back.  He will ask NP seeing today if she would draw BMET and Dig level with fax results to [facility].

## 2015-07-17 NOTE — Telephone Encounter (Signed)
Husband called. Patient is going to have blood drawn this afternoon (thyroid level) for NP Nonda Lou.  They are going to [place] and she will be back for her 11/23 appointment with Dr. Haroldine Laws.  Husband wants to know if there is any additional lab that she could have done at this time.  Called office [phone]. Placed on hold twice and did not get back to me Will call office again

## 2015-07-19 ENCOUNTER — Ambulatory Visit: Payer: Medicare Other | Admitting: Rehabilitation

## 2015-07-20 ENCOUNTER — Telehealth (HOSPITAL_COMMUNITY): Payer: Self-pay

## 2015-07-20 NOTE — Telephone Encounter (Signed)
Husband is having patients primary care doctor fax over her results for her BMET and Digoxin level that was drawn at that office for [place] at the [facility] Husbands number [phone]

## 2015-07-24 ENCOUNTER — Ambulatory Visit: Payer: Medicare Other | Admitting: Rehabilitation

## 2015-07-26 ENCOUNTER — Ambulatory Visit: Payer: Medicare Other | Admitting: Rehabilitation

## 2015-07-28 ENCOUNTER — Other Ambulatory Visit (HOSPITAL_COMMUNITY): Payer: Self-pay | Admitting: Cardiology

## 2015-07-31 ENCOUNTER — Encounter (HOSPITAL_COMMUNITY): Payer: Medicare Other

## 2015-08-02 ENCOUNTER — Ambulatory Visit (HOSPITAL_COMMUNITY)
Admission: RE | Admit: 2015-08-02 | Discharge: 2015-08-02 | Disposition: A | Discharge status: Home / Self Care | Payer: Medicare Other | Source: Ambulatory Visit | Attending: Internal Medicine | Admitting: Internal Medicine

## 2015-08-02 ENCOUNTER — Ambulatory Visit (HOSPITAL_COMMUNITY)
Admission: RE | Admit: 2015-08-02 | Discharge: 2015-08-02 | Disposition: A | Discharge status: Home / Self Care | Payer: Medicare Other | Source: Ambulatory Visit | Attending: Cardiology | Admitting: Cardiology

## 2015-08-02 VITALS — BP 106/60 | HR 78 | Wt 129.0 lb

## 2015-08-02 DIAGNOSIS — J90 Pleural effusion, not elsewhere classified: Secondary | ICD-10-CM

## 2015-08-02 DIAGNOSIS — D61818 Other pancytopenia: Secondary | ICD-10-CM

## 2015-08-02 DIAGNOSIS — J45909 Unspecified asthma, uncomplicated: Secondary | ICD-10-CM | POA: Diagnosis not present

## 2015-08-02 DIAGNOSIS — I5022 Chronic systolic (congestive) heart failure: Secondary | ICD-10-CM | POA: Diagnosis not present

## 2015-08-02 LAB — COMPREHENSIVE METABOLIC PANEL
ALBUMIN: 3 g/dL — AB (ref 3.5–5.0)
ALK PHOS: 128 U/L — AB (ref 38–126)
ALT: 17 U/L (ref 14–54)
ANION GAP: 9 (ref 5–15)
AST: 35 U/L (ref 15–41)
BILIRUBIN TOTAL: 0.4 mg/dL (ref 0.3–1.2)
BUN: 23 mg/dL — ABNORMAL HIGH (ref 6–20)
CALCIUM: 11.2 mg/dL — AB (ref 8.9–10.3)
CO2: 29 mmol/L (ref 22–32)
CREATININE: 1.69 mg/dL — AB (ref 0.44–1.00)
Chloride: 102 mmol/L (ref 101–111)
GFR calc non Af Amer: 30 mL/min — ABNORMAL LOW (ref >60)
GFR, EST AFRICAN AMERICAN: 35 mL/min — AB (ref >60)
GLUCOSE: 177 mg/dL — AB (ref 65–99)
Potassium: 3.9 mmol/L (ref 3.5–5.1)
SODIUM: 140 mmol/L (ref 135–145)
TOTAL PROTEIN: 5.5 g/dL — AB (ref 6.5–8.1)

## 2015-08-02 NOTE — Patient Instructions (Signed)
Lab today  Chest x-ray today  We will contact you in 3 months to schedule your next appointment.

## 2015-08-04 DIAGNOSIS — J90 Pleural effusion, not elsewhere classified: Secondary | ICD-10-CM | POA: Insufficient documentation

## 2015-08-04 NOTE — Progress Notes (Signed)
Patient ID: Victoria Wood, female   DOB: Mar 01, 1951, 64 y.o.   MRN: [id number]  ADVANCED HF CLINIC NOTE  Patient ID: Victoria Wood, female   DOB: 08/14/1951, 64 y.o.   MRN: [id number] PCP: Vicenta [facility] HF Cardiologist: Aundra Dubin  64 yo with long history of dementia, non-Hodgkin's lymphoma (since age 19) with multiple rounds of chemo and history of autologous bone marrow transplant was admitted in 6/16 with acute systolic CHF. She had R-CHOP and then Rituxan/bendamustine more recently, which has been completed. She had a PET scan not long ago, and she was told that her cancer is non-active. Echo in 6/16 showed EF 15-20%, diffuse hypokinesis, moderate MR, mild AI.  She went into atrial fibrillation with RVR while in the hospital.  She converted back to NSR with amiodarone.  We did not start long-term anticoagulation due to thrombocytopenia and fall risk.  Low blood pressure limited medication titration.  At last appointment, I tried starting her back on lisinopril.  She developed symptomatic hypotension even with 1.25 mg daily lisinopril, so I had her stop it.  Today, BP is stable.  No lightheadedness.  Weight is down 3 lbs.  She has not had to use any Lasix since last appointment.  She remains in NSR. Not very active but no dyspnea walking around the house.  No chest pain.  No orthopnea/PND.  No palpitations.  She has been having abdominal pain and is going to be seeing her oncologist about this soon.   CXR: new right-sided pleural effusion.   Labs (7/16): K 3.4, creatinine 1.13, hgb 8.8, plts 44K Labs (8/16): K 4.0 creatinine 1.37 dig 0.8, LFTs normal Labs (9/16): K 4.4 => 5.1, creatinine 2.40 => 1.88, digoxin 0.6 => 0.8, BNP 367, TSH 15, hgb 8.8 Labs (10/16): K 3.8, creatinine 1.41 Labs (11/16): digoxin 0.6  PMH: 1. Atrial fibrillation: Paroxysmal, she is on amiodarone.  No anticoagulation with fall risk/thrombocytopenia. 2. Non-Hodgkins lymphoma: Has been followed in [place].  She has had the  diagnosis since age 58 with multiple rounds of chemo and history of autologous bone marrow transplant. She had R-CHOP and then Rituxan/bendamustine more recently, which has been completed. She had a PET scan not long ago, and she was told that her cancer is non-active. 3. Cardiomyopathy: Echo (6/16) with EF 15-20%, mildly dilated LV, moderate MR.  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 Echo (9/16) with EF 30-35%, normal RV size and systolic function.  4.  Dementia 5. Thrombocytopenia: Chronic.  6. Type II diabetes.  7. GERD 8. Depression 9. Hyperlipidemia 10. CKD 11. Hypothyroidism  SH: Married, lives with husband part of the year in [place] and part of the year in [place].  Has children.  Prior smoker.    FH: No premature CAD  ROS: All systems reviewed and negative except as per HPI.    Current Outpatient Prescriptions  Medication Sig Dispense Refill  . acetaminophen (TYLENOL) 500 MG tablet Take 500 mg by mouth every 6 (six) hours as needed for mild pain.    Marland Kitchen acyclovir (ZOVIRAX) 400 MG tablet Take 400 mg by mouth 2 (two) times daily.    Marland Kitchen albuterol (PROVENTIL HFA;VENTOLIN HFA) 108 (90 BASE) MCG/ACT inhaler Inhale 2 puffs into the lungs every 6 (six) hours as needed. Wheezing     . allopurinol (ZYLOPRIM) 100 MG tablet Take 100 mg by mouth daily.  4  . amiodarone (PACERONE) 200 MG tablet Take 1 tablet (200 mg total) by mouth daily. 30 tablet 3  . [facility]  FIBER GUMMIES PO Take 1 tablet by mouth daily.    . digoxin (LANOXIN) 0.125 MG tablet Take 0.5 tablets (0.0625 mg total) by mouth daily. 30 tablet 6  . furosemide (LASIX) 40 MG tablet Take 1 tablet (40 mg total) by mouth as needed (for weight 130 lb or greater). For weight greater than 130 lbs. 30 tablet 3  . ivabradine (CORLANOR) 7.5 MG TABS tablet Take 1 tablet (7.5 mg total) by mouth 2 (two) times daily with a meal. 60 tablet 5  . levothyroxine (SYNTHROID, LEVOTHROID) 88 MCG tablet Take 88 mcg by mouth daily before  breakfast.    . Magnesium Cl-Calcium Carbonate (SLOW-MAG PO) Take 1 tablet by mouth daily.     Marland Kitchen MELATONIN PO Take 1 tablet by mouth at bedtime.    . potassium chloride (K-DUR) 10 MEQ tablet Take 1 tablet (10 mEq total) by mouth as needed (when you take Furosemide). Only when you take a Lasix 30 tablet 3  . venlafaxine XR (EFFEXOR-XR) 75 MG 24 hr capsule Take 75 mg by mouth 2 (two) times daily.     No current facility-administered medications for this encounter.   BP 106/60 mmHg  Pulse 78  Wt 129 lb (58.514 kg)  SpO2 100%   General: NAD Neck: No JVD, +RIJ port-a-cath. no thyromegaly or thyroid nodule noted.  Lungs: Decreased breath sounds right base.  CV: PMI nondisplaced.  Heart regular S1/S2, no S3/S4, 2/6 HSM apex.  No peripheral edema.  No carotid bruit.  Normal pedal pulses.  Abdomen: Soft, mild diffuse tenderness, no HSM, no distention.  Skin: Intact without lesions or rashes.  Neurologic: Alert and oriented x 3.  Psych: Normal affect. Extremities: No clubbing or cyanosis.  HEENT: Normal.   Assessment/Plan: 1.  Chronic systolic CHF: EF 99991111 by echo 9/16. Improved from 15-20% by 6/16 echo. 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 HR improved on Corlanor and BP stable. She appears euvolemic.  Lisinopril and Lasix recently held for hypotension/dehydration. - Continue digoxin, recent level ok.  - Continue Corlanor 7.5 bid. No b-blocker yet due to low BP.  - Use lasix prn for weights 130 or greater (has not needed). - BMET today.   - Will leave off lisinopril as her BP does not tolerate even low dose.  - She has a cardiomyopathy likely from doxorubicin cardiac toxicity. She would not be a transplant candidate. With her dementia and NHL as well as questionable functional status,  she would likely not be a good LVAD or ICD candidate.  2. Atrial fibrillation/flutter: New onset in 6/16 in hospital. She is a poor long-term anticoagulation candidate with significant  thrombocytopenia and fairly high fall risk. She converted over to NSR and remains in NSR today.  - Continue amiodarone to 200 mg daily.   She will need regular eye exams. Check LFTs today.  Has hypothyroidism followed by PCP.  3. NHL: Completed most recent chemo regimen. Per husband, last PET scan showed that disease is "non-active." She has been having abdominal pain, and had decreased breath sounds right lung base on exam.  CXR after this visit showed right pleural effusion, new.  I am concerned about disease recurrence.  She is seeing her oncologist soon.  I am going to try to arrange for US-guided thoracentesis by IR.  Will need to send cytology from the pleural fluid.  4. Dementia: x several years. She is on Aricept.  5. Thrombocytopenia: Chronic, mainly 40K range.   6. Hypothyroidism: May be related to [facility].  Started synthroid 04/2015.   7. CKD: BMET today.   Labs today.  Follow up in 3 months.  Loralie Champagne 08/04/2015

## 2015-08-07 ENCOUNTER — Telehealth (HOSPITAL_COMMUNITY): Payer: Self-pay

## 2015-08-07 NOTE — Telephone Encounter (Signed)
Lab result note documented.  Results discussed with husband.   Kim RN arranging for thoracentesis.  She will call back husband and discuss

## 2015-08-07 NOTE — Telephone Encounter (Signed)
Spoke with husband about results and procedure. They are boarding a plane now and flying to [facility]. Will fax results to oncologist and have them arrange for the Thoracentesis along with the lab work serum and pleural fluid.

## 2015-09-12 ENCOUNTER — Encounter: Payer: Medicare Other | Admitting: Rehabilitation

## 2015-09-12 NOTE — Therapy (Signed)
[facility] [address] [place], [place], [address] Phone: [phone]   Fax:  [phone]  Patient Details  Name: Victoria Wood MRN: [id number] Date of Birth: 1950-12-13 Referring Provider:  No ref. provider found  Encounter Date: 09/12/2015    PHYSICAL THERAPY DISCHARGE SUMMARY  Visits from Start of Care: 9  Current functional level related to goals / functional outcomes: PT Long Term Goals - 07/17/15 1115    PT LONG TERM GOAL #1   Title Pt will improve DGI score to 18/24 to indicate functional decrease in fall risk at home. (Target date: 07/18/15)   Baseline 07/17/15: pt scored 13/24 today (up 1 point from evaluation score of 12/24).   Time --   Period --   Status Not Met   PT LONG TERM GOAL #2   Title Pt will perform HEP with min cues from family for improved functional strength and balance to indicate decreased fall risk. (Target date: 07/18/15)   Baseline 07/17/15: spouse reports pt will do HEP with him, however is resistant with other caregivers. They did not have any questions at this time.   Time --   Period --   Status Achieved   PT LONG TERM GOAL #3   Title Pt will ambulate >300' on outdoor surfaces including grass, ramp, curb without AD at S level to indicate safe negotiation in community. (Target date: 07/18/15)   Baseline 07/17/15: pt needing assist on indoor level surfaces without AD, therefore did not check this goal today.   Time --   Period --   Status Deferred   PT LONG TERM GOAL #4   Title Pt will increase 6MWT distance by 191' to indicate improvement in functional endurance. (Target date: 07/18/15)   Baseline 07/17/15: pt increased distance from 672 feet to 893 feet today.   Time --   Period --   Status Achieved   PT LONG TERM GOAL #5   Title Pt will report no falls in last 4 weeks to indicate improved functional balance and  safety in home and community. (Target date: 07/18/15)   Baseline 07/17/15: Spouse/pt report 2 falls in the last 1.5 weeks.   Time --   Period --   Status Not Met            Remaining deficits: Unsure as she did not return for follow up visits.  Husband wanting her to return with him to [place] and states he would call back if wanting to return in January.  Have not heard from husband, therefore will D/C at this time.     Education / Equipment: HEP, education to use rollator, fall prevention strategies.    Plan: Patient agrees to discharge.  Patient goals were not met. Patient is being discharged due to not returning since the last visit.  ?????and DC due to lack of progress towards goals.               Cameron Sprang, PT, MPT [facility] [address] [place], [place], [address] Phone: [phone]   Fax:  [phone] 09/12/2015, 9:53 AM

## 2015-11-17 ENCOUNTER — Telehealth (HOSPITAL_COMMUNITY): Payer: Self-pay | Admitting: Cardiology

## 2015-11-17 NOTE — Telephone Encounter (Signed)
Patients husband called with concerns regarding medications Patient recently moved back to [facility] and has been enrolled in hospice/palliative care During enrollment, medication list reviewed and questions regarding continuation of corlanor came up  Should patient continue corlanor?

## 2015-11-17 NOTE — Telephone Encounter (Signed)
Would continue as long as no side effects from it.  She will probably feel better on it.

## 2015-11-20 NOTE — Telephone Encounter (Signed)
pts husband reports patient passed away 11/28/15

## 2015-12-09 DEATH — deceased
